# Patient Record
Sex: Female | Born: 1980 | Race: White | Hispanic: No | Marital: Married | Smoking: Current every day smoker
Health system: Southern US, Community
[De-identification: ages and names within clinical notes are randomized; demographics above are authoritative.]

## PROBLEM LIST (undated history)

## (undated) DIAGNOSIS — E039 Hypothyroidism, unspecified: Secondary | ICD-10-CM

## (undated) DIAGNOSIS — G2581 Restless legs syndrome: Secondary | ICD-10-CM

## (undated) DIAGNOSIS — R42 Dizziness and giddiness: Secondary | ICD-10-CM

## (undated) DIAGNOSIS — I471 Supraventricular tachycardia, unspecified: Secondary | ICD-10-CM

## (undated) DIAGNOSIS — N926 Irregular menstruation, unspecified: Secondary | ICD-10-CM

## (undated) DIAGNOSIS — K219 Gastro-esophageal reflux disease without esophagitis: Secondary | ICD-10-CM

## (undated) DIAGNOSIS — D8989 Other specified disorders involving the immune mechanism, not elsewhere classified: Secondary | ICD-10-CM

## (undated) DIAGNOSIS — N930 Postcoital and contact bleeding: Secondary | ICD-10-CM

## (undated) DIAGNOSIS — I4891 Unspecified atrial fibrillation: Secondary | ICD-10-CM

## (undated) DIAGNOSIS — D6861 Antiphospholipid syndrome: Secondary | ICD-10-CM

## (undated) DIAGNOSIS — I4719 Other supraventricular tachycardia: Secondary | ICD-10-CM

## (undated) DIAGNOSIS — E782 Mixed hyperlipidemia: Secondary | ICD-10-CM

## (undated) DIAGNOSIS — G43909 Migraine, unspecified, not intractable, without status migrainosus: Secondary | ICD-10-CM

## (undated) DIAGNOSIS — Z973 Presence of spectacles and contact lenses: Secondary | ICD-10-CM

## (undated) DIAGNOSIS — D259 Leiomyoma of uterus, unspecified: Secondary | ICD-10-CM

## (undated) DIAGNOSIS — N840 Polyp of corpus uteri: Secondary | ICD-10-CM

## (undated) DIAGNOSIS — R000 Tachycardia, unspecified: Secondary | ICD-10-CM

## (undated) HISTORY — DX: Morbid (severe) obesity due to excess calories: E66.01

## (undated) HISTORY — DX: Other supraventricular tachycardia: I47.19

## (undated) HISTORY — PX: TONSILLECTOMY: SUR1361

## (undated) HISTORY — DX: Hypothyroidism, unspecified: E03.9

## (undated) HISTORY — DX: Gastro-esophageal reflux disease without esophagitis: K21.9

## (undated) HISTORY — DX: Supraventricular tachycardia: I47.1

## (undated) HISTORY — DX: Mixed hyperlipidemia: E78.2

---

## 2012-08-23 ENCOUNTER — Emergency Department (HOSPITAL_COMMUNITY): Payer: No Typology Code available for payment source

## 2012-08-23 ENCOUNTER — Emergency Department (HOSPITAL_COMMUNITY)
Admission: EM | Admit: 2012-08-23 | Discharge: 2012-08-23 | Disposition: A | Discharge status: Home / Self Care | Payer: No Typology Code available for payment source | Attending: Emergency Medicine | Admitting: Emergency Medicine

## 2012-08-23 ENCOUNTER — Encounter (HOSPITAL_COMMUNITY): Payer: Self-pay | Admitting: *Deleted

## 2012-08-23 DIAGNOSIS — Y9389 Activity, other specified: Secondary | ICD-10-CM | POA: Insufficient documentation

## 2012-08-23 DIAGNOSIS — R51 Headache: Secondary | ICD-10-CM | POA: Insufficient documentation

## 2012-08-23 DIAGNOSIS — M545 Low back pain, unspecified: Secondary | ICD-10-CM | POA: Insufficient documentation

## 2012-08-23 DIAGNOSIS — E039 Hypothyroidism, unspecified: Secondary | ICD-10-CM | POA: Insufficient documentation

## 2012-08-23 DIAGNOSIS — M359 Systemic involvement of connective tissue, unspecified: Secondary | ICD-10-CM | POA: Insufficient documentation

## 2012-08-23 DIAGNOSIS — F17200 Nicotine dependence, unspecified, uncomplicated: Secondary | ICD-10-CM | POA: Insufficient documentation

## 2012-08-23 DIAGNOSIS — S161XXA Strain of muscle, fascia and tendon at neck level, initial encounter: Secondary | ICD-10-CM

## 2012-08-23 DIAGNOSIS — R110 Nausea: Secondary | ICD-10-CM | POA: Insufficient documentation

## 2012-08-23 DIAGNOSIS — R000 Tachycardia, unspecified: Secondary | ICD-10-CM | POA: Insufficient documentation

## 2012-08-23 DIAGNOSIS — R109 Unspecified abdominal pain: Secondary | ICD-10-CM | POA: Insufficient documentation

## 2012-08-23 DIAGNOSIS — S139XXA Sprain of joints and ligaments of unspecified parts of neck, initial encounter: Secondary | ICD-10-CM | POA: Insufficient documentation

## 2012-08-23 DIAGNOSIS — R911 Solitary pulmonary nodule: Secondary | ICD-10-CM | POA: Insufficient documentation

## 2012-08-23 DIAGNOSIS — D6859 Other primary thrombophilia: Secondary | ICD-10-CM | POA: Insufficient documentation

## 2012-08-23 HISTORY — DX: Antiphospholipid syndrome: D68.61

## 2012-08-23 HISTORY — DX: Hypothyroidism, unspecified: E03.9

## 2012-08-23 HISTORY — DX: Other specified disorders involving the immune mechanism, not elsewhere classified: D89.89

## 2012-08-23 HISTORY — DX: Tachycardia, unspecified: R00.0

## 2012-08-23 MED ORDER — OXYCODONE-ACETAMINOPHEN 5-325 MG PO TABS
2.0000 | ORAL_TABLET | Freq: Once | ORAL | Status: AC
  Administered 2012-08-23: 2 via ORAL
  Filled 2012-08-23: qty 2

## 2012-08-23 MED ORDER — OXYCODONE-ACETAMINOPHEN 5-325 MG PO TABS: 2.0000 | ORAL_TABLET | ORAL | Status: DC | PRN

## 2012-08-23 NOTE — ED Notes (Signed)
Patient transported to X-ray 
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
## 2013-04-26 ENCOUNTER — Encounter (HOSPITAL_BASED_OUTPATIENT_CLINIC_OR_DEPARTMENT_OTHER): Payer: Self-pay | Admitting: *Deleted

## 2013-04-26 ENCOUNTER — Emergency Department (HOSPITAL_BASED_OUTPATIENT_CLINIC_OR_DEPARTMENT_OTHER)
Admission: EM | Admit: 2013-04-26 | Discharge: 2013-04-26 | Disposition: A | Discharge status: Home / Self Care | Payer: No Typology Code available for payment source | Attending: Emergency Medicine | Admitting: Emergency Medicine

## 2013-04-26 ENCOUNTER — Emergency Department (HOSPITAL_BASED_OUTPATIENT_CLINIC_OR_DEPARTMENT_OTHER): Payer: No Typology Code available for payment source

## 2013-04-26 DIAGNOSIS — M79662 Pain in left lower leg: Secondary | ICD-10-CM

## 2013-04-26 DIAGNOSIS — Z8639 Personal history of other endocrine, nutritional and metabolic disease: Secondary | ICD-10-CM | POA: Insufficient documentation

## 2013-04-26 DIAGNOSIS — Z3202 Encounter for pregnancy test, result negative: Secondary | ICD-10-CM | POA: Insufficient documentation

## 2013-04-26 DIAGNOSIS — Z79899 Other long term (current) drug therapy: Secondary | ICD-10-CM | POA: Insufficient documentation

## 2013-04-26 DIAGNOSIS — M79609 Pain in unspecified limb: Secondary | ICD-10-CM | POA: Insufficient documentation

## 2013-04-26 DIAGNOSIS — R609 Edema, unspecified: Secondary | ICD-10-CM | POA: Insufficient documentation

## 2013-04-26 DIAGNOSIS — F17200 Nicotine dependence, unspecified, uncomplicated: Secondary | ICD-10-CM | POA: Insufficient documentation

## 2013-04-26 DIAGNOSIS — Z862 Personal history of diseases of the blood and blood-forming organs and certain disorders involving the immune mechanism: Secondary | ICD-10-CM | POA: Insufficient documentation

## 2013-04-26 DIAGNOSIS — E039 Hypothyroidism, unspecified: Secondary | ICD-10-CM | POA: Insufficient documentation

## 2013-04-26 LAB — URINALYSIS, ROUTINE W REFLEX MICROSCOPIC
Bilirubin Urine: NEGATIVE
Glucose, UA: NEGATIVE mg/dL
Ketones, ur: NEGATIVE mg/dL
Protein, ur: NEGATIVE mg/dL
Urobilinogen, UA: 0.2 mg/dL (ref 0.0–1.0)

## 2013-04-26 LAB — PREGNANCY, URINE: Preg Test, Ur: NEGATIVE

## 2013-04-26 LAB — URINE MICROSCOPIC-ADD ON

## 2013-04-26 MED ORDER — ACETAMINOPHEN 500 MG PO TABS
1000.0000 mg | ORAL_TABLET | Freq: Once | ORAL | Status: AC
  Administered 2013-04-26: 1000 mg via ORAL
  Filled 2013-04-26: qty 2

## 2013-04-26 MED ORDER — HYDROCHLOROTHIAZIDE 25 MG PO TABS: 25.0000 mg | ORAL_TABLET | Freq: Every day | ORAL | Status: DC

## 2013-04-26 MED ORDER — HYDROCODONE-ACETAMINOPHEN 5-325 MG PO TABS: 2.0000 | ORAL_TABLET | ORAL | Status: DC | PRN

## 2013-04-26 NOTE — ED Provider Notes (Signed)
CSN: [id number]     Arrival date & time 04/26/13  1851 History     First MD Initiated Contact with Patient 04/26/13 1900     Chief Complaint  Patient presents with  . Leg Pain   (Consider location/radiation/quality/duration/timing/severity/associated sxs/prior Treatment) HPI Comments: Patient presents with complaints of bilateral leg swelling and pain in the left calf and left thigh. She denies any injury or trauma. She reports that she recently took a vacation to [place] and flew home within the past several days. She tells me her flight was approximately 13 hours long. She denies any history of blood clots. She denies any chest pain or shortness of breath. She does report that she has had dependent edema in the past and has been on hydrochlorothiazide but has not been taking this in many months.  Patient is a 32 y.o. female presenting with leg pain. The history is provided by the patient.  Leg Pain Lower extremity pain location: thigh and calf. Time since incident:  3 days Injury: no   Pain details:    Quality:  Aching   Radiates to:  Does not radiate   Severity:  Moderate   Onset quality:  Gradual   Duration:  3 days   Timing:  Constant   Progression:  Worsening Chronicity:  New   Past Medical History  Diagnosis Date  . Hypothyroid   . Tachycardia   . Antiphospholipid antibody syndrome   . Autoimmune disorder    Past Surgical History  Procedure Laterality Date  . Tonsillectomy     No family history on file. History  Substance Use Topics  . Smoking status: Current Every Day Smoker -- 1.00 packs/day    Types: Cigarettes  . Smokeless tobacco: Not on file  . Alcohol Use: No   OB History   Grav Para Term Preterm Abortions TAB SAB Ect Mult Living                 Review of Systems  All other systems reviewed and are negative.    Allergies  Nsaids  Home Medications   Current Outpatient Rx  Name  Route  Sig  Dispense  Refill  . Vitamin D, Ergocalciferol,  (DRISDOL) 50000 UNITS CAPS   Oral   Take 50,000 Units by mouth.         . clonazePAM (KLONOPIN) 0.5 MG tablet   Oral   Take 0.25 mg by mouth as needed. Restless leg         . levothyroxine (SYNTHROID, LEVOTHROID) 125 MCG tablet   Oral   Take 125 mcg by mouth daily.         . metoprolol succinate (TOPROL XL) 50 MG 24 hr tablet   Oral   Take 50 mg by mouth daily. Take with or immediately following a meal.         . oxyCODONE-acetaminophen (PERCOCET/ROXICET) 5-325 MG per tablet   Oral   Take 2 tablets by mouth every 4 (four) hours as needed for pain.   6 tablet   0    BP 130/93  Pulse 73  Temp(Src) 98.3 F (36.8 C) (Oral)  Resp 20  Wt 225 lb (102.059 kg)  BMI 39.87 kg/m2  SpO2 100% Physical Exam  Nursing note and vitals reviewed. Constitutional: She is oriented to person, place, and time. She appears well-developed and well-nourished. No distress.  HENT:  Head: Normocephalic and atraumatic.  Neck: Normal range of motion. Neck supple.  Cardiovascular: Normal rate and regular rhythm.  Exam reveals no gallop and no friction rub.   No murmur heard. Pulmonary/Chest: Effort normal and breath sounds normal. No respiratory distress. She has no wheezes.  Abdominal: Soft. Bowel sounds are normal. She exhibits no distension. There is no tenderness.  Musculoskeletal: Normal range of motion. She exhibits edema.  The bilateral lower extremities are noted to have non-pitting edema. The dorsalis pedis and posterior tibial pulses are palpable and equal bilaterally. The left calf is tender to palpation and Homans sign is present. There is also tenderness to palpation in the left thigh. There is no redness.  Neurological: She is alert and oriented to person, place, and time.  Skin: Skin is warm and dry. She is not diaphoretic.    ED Course   Procedures (including critical care time)  Labs Reviewed  URINALYSIS, ROUTINE W REFLEX MICROSCOPIC - Abnormal; Notable for the following:     Hgb urine dipstick MODERATE (*)    All other components within normal limits  PREGNANCY, URINE  URINE MICROSCOPIC-ADD ON   US Venous Img Lower Unilateral Left  04/26/2013   *RADIOLOGY REPORT*  Clinical Data: Left calf and thigh pain and history of antiphospholipid antibody syndrome.  Recent prolonged air flight.  LEFT LOWER EXTREMITY VENOUS DUPLEX ULTRASOUND  Technique:  Gray-scale sonography with graded compression, as well as color Doppler and duplex ultrasound were performed to evaluate the deep venous system of the lower extremity from the level of the common femoral vein through the popliteal and proximal calf veins. Spectral Doppler was utilized to evaluate flow at rest and with distal augmentation maneuvers.  Comparison:  None.  Findings:  Normal compressibility of the common femoral, superficial femoral, and popliteal veins is demonstrated, as well as the visualized proximal calf veins.  No filling defects to suggest DVT on grayscale or color Doppler imaging.  Doppler waveforms show normal direction of venous flow, normal respiratory phasicity and response to augmentation.  No evidence of superficial thrombophlebitis or abnormal fluid collections.  IMPRESSION: No evidence of left lower extremity deep vein thrombosis.   Original Report Authenticated By: Irish Lack, M.D.   No diagnosis found.  MDM  The workup today reveals no evidence for blood clot. I suspect her symptoms are related to edema possibly related to the plane ride home. I will prescribe hydrochlorothiazide and advised her to keep her feet elevated for the next several days. She is also instructed to return to the emergency department should she experience further problems or if her condition worsens.  Geoffery Lyons, MD 04/26/13 2041

## 2013-04-26 NOTE — ED Notes (Signed)
Pt. Reports on Sunday she returned home from [place].  Pt. Was on a long flight and started to feel pain while walking thru the airport on Sat.

## 2013-04-26 NOTE — ED Notes (Signed)
Left leg pain. Pain has moved from her calf to her thigh. Just got back from a 13 hour air flight.

## 2013-06-01 ENCOUNTER — Encounter (HOSPITAL_BASED_OUTPATIENT_CLINIC_OR_DEPARTMENT_OTHER): Payer: Self-pay | Admitting: *Deleted

## 2013-06-01 ENCOUNTER — Emergency Department (HOSPITAL_BASED_OUTPATIENT_CLINIC_OR_DEPARTMENT_OTHER)
Admission: EM | Admit: 2013-06-01 | Discharge: 2013-06-01 | Disposition: A | Discharge status: Home / Self Care | Payer: No Typology Code available for payment source | Attending: Emergency Medicine | Admitting: Emergency Medicine

## 2013-06-01 ENCOUNTER — Emergency Department (HOSPITAL_BASED_OUTPATIENT_CLINIC_OR_DEPARTMENT_OTHER): Payer: No Typology Code available for payment source

## 2013-06-01 DIAGNOSIS — W010XXA Fall on same level from slipping, tripping and stumbling without subsequent striking against object, initial encounter: Secondary | ICD-10-CM | POA: Insufficient documentation

## 2013-06-01 DIAGNOSIS — Z8639 Personal history of other endocrine, nutritional and metabolic disease: Secondary | ICD-10-CM | POA: Insufficient documentation

## 2013-06-01 DIAGNOSIS — S90512A Abrasion, left ankle, initial encounter: Secondary | ICD-10-CM

## 2013-06-01 DIAGNOSIS — E039 Hypothyroidism, unspecified: Secondary | ICD-10-CM | POA: Insufficient documentation

## 2013-06-01 DIAGNOSIS — W19XXXA Unspecified fall, initial encounter: Secondary | ICD-10-CM

## 2013-06-01 DIAGNOSIS — F17200 Nicotine dependence, unspecified, uncomplicated: Secondary | ICD-10-CM | POA: Insufficient documentation

## 2013-06-01 DIAGNOSIS — Z862 Personal history of diseases of the blood and blood-forming organs and certain disorders involving the immune mechanism: Secondary | ICD-10-CM | POA: Insufficient documentation

## 2013-06-01 DIAGNOSIS — Z79899 Other long term (current) drug therapy: Secondary | ICD-10-CM | POA: Insufficient documentation

## 2013-06-01 DIAGNOSIS — Y9301 Activity, walking, marching and hiking: Secondary | ICD-10-CM | POA: Insufficient documentation

## 2013-06-01 DIAGNOSIS — IMO0002 Reserved for concepts with insufficient information to code with codable children: Secondary | ICD-10-CM | POA: Insufficient documentation

## 2013-06-01 DIAGNOSIS — Y92009 Unspecified place in unspecified non-institutional (private) residence as the place of occurrence of the external cause: Secondary | ICD-10-CM | POA: Insufficient documentation

## 2013-06-01 DIAGNOSIS — Z23 Encounter for immunization: Secondary | ICD-10-CM | POA: Insufficient documentation

## 2013-06-01 MED ORDER — HYDROCODONE-ACETAMINOPHEN 5-325 MG PO TABS: 2.0000 | ORAL_TABLET | Freq: Four times a day (QID) | ORAL | Status: DC | PRN

## 2013-06-01 MED ORDER — TETANUS-DIPHTH-ACELL PERTUSSIS 5-2.5-18.5 LF-MCG/0.5 IM SUSP
0.5000 mL | Freq: Once | INTRAMUSCULAR | Status: AC
  Administered 2013-06-01: 0.5 mL via INTRAMUSCULAR
  Filled 2013-06-01: qty 0.5

## 2013-06-01 NOTE — ED Notes (Signed)
Pt reports slipped and fell on vinyl floor this am. Has abrasion to left shin and pain to first three toes on left foot. Able to move ankle, toes limited, swelling noted to big toe.

## 2013-06-01 NOTE — ED Provider Notes (Signed)
CSN: [id number]     Arrival date & time 06/01/13  1033 History   First MD Initiated Contact with Patient 06/01/13 1132     Chief Complaint  Patient presents with  . Leg Pain  . Toe Pain   (Consider location/radiation/quality/duration/timing/severity/associated sxs/prior Treatment) HPI Patient presents several hours after sustaining an injury to her left lower extremity.  She states that she was walking on stairs, slipped, fell.  Since that time she has been ambulatory to  She has focal pain about the left shin, left ankle, left foot. No dysesthesia, no weakness.  Pain is sharp, worse with palpation or motion. The patient has minor hip discomfort, but no true pain. No loss of consciousness him in no subsequent confusion, disorientation, other focal new complaints.  Past Medical History  Diagnosis Date  . Hypothyroid   . Tachycardia   . Antiphospholipid antibody syndrome   . Autoimmune disorder    Past Surgical History  Procedure Laterality Date  . Tonsillectomy     No family history on file. History  Substance Use Topics  . Smoking status: Current Every Day Smoker -- 1.00 packs/day    Types: Cigarettes  . Smokeless tobacco: Not on file  . Alcohol Use: No   OB History   Grav Para Term Preterm Abortions TAB SAB Ect Mult Living                 Review of Systems  All other systems reviewed and are negative.    Allergies  Nsaids  Home Medications   Current Outpatient Rx  Name  Route  Sig  Dispense  Refill  . clonazePAM (KLONOPIN) 0.5 MG tablet   Oral   Take 0.25 mg by mouth as needed. Restless leg         . hydrochlorothiazide (HYDRODIURIL) 25 MG tablet   Oral   Take 1 tablet (25 mg total) by mouth daily.   20 tablet   1   . HYDROcodone-acetaminophen (NORCO/VICODIN) 5-325 MG per tablet   Oral   Take 2 tablets by mouth every 6 (six) hours as needed for pain.   15 tablet   0   . levothyroxine (SYNTHROID, LEVOTHROID) 125 MCG tablet   Oral   Take 125 mcg by  mouth daily.         . metoprolol succinate (TOPROL XL) 50 MG 24 hr tablet   Oral   Take 50 mg by mouth daily. Take with or immediately following a meal.         . Vitamin D, Ergocalciferol, (DRISDOL) 50000 UNITS CAPS   Oral   Take 50,000 Units by mouth.          BP 125/85  Pulse 71  Temp(Src) 98 F (36.7 C) (Oral)  Resp 16  SpO2 99%  LMP 05/18/2013 Physical Exam  Nursing note and vitals reviewed. Constitutional: She is oriented to person, place, and time. She appears well-developed and well-nourished. No distress.  HENT:  Head: Normocephalic and atraumatic.  Eyes: Conjunctivae and EOM are normal.  Cardiovascular: Normal rate and regular rhythm.   Pulmonary/Chest: Effort normal and breath sounds normal. No stridor. No respiratory distress.  Abdominal: She exhibits no distension.  Musculoskeletal: She exhibits no edema.       Right hip: Normal.       Left hip: Normal.       Left knee: Normal.       Legs: Neurological: She is alert and oriented to person, place, and time. No cranial  nerve deficit.  Skin: Skin is warm and dry.  Psychiatric: She has a normal mood and affect.    ED Course  ORTHOPEDIC INJURY TREATMENT Date/Time: 06/01/2013 12:45 PM Performed by: Gerhard Munch Authorized by: Gerhard Munch Consent: Verbal consent obtained. The procedure was performed in an emergent situation. Risks and benefits: risks, benefits and alternatives were discussed Consent given by: patient Patient understanding: patient states understanding of the procedure being performed Patient consent: the patient's understanding of the procedure matches consent given Procedure consent: procedure consent matches procedure scheduled Relevant documents: relevant documents present and verified Test results: test results available and properly labeled Site marked: the operative site was marked Imaging studies: imaging studies available Required items: required blood products,  implants, devices, and special equipment available Patient identity confirmed: verbally with patient Time out: Immediately prior to procedure a "time out" was called to verify the correct patient, procedure, equipment, support staff and site/side marked as required. Injury location: ankle Location details: left ankle Injury type: soft tissue Pre-procedure neurovascular assessment: neurovascularly intact Pre-procedure distal perfusion: normal Pre-procedure neurological function: normal Pre-procedure range of motion: reduced Local anesthesia used: no Patient sedated: no Immobilization: splint Splint type: cam walker. Post-procedure neurovascular assessment: post-procedure neurovascularly intact Post-procedure distal perfusion: normal Post-procedure neurological function: normal Post-procedure range of motion: unchanged Patient tolerance: Patient tolerated the procedure well with no immediate complications.   (including critical care time) Labs Review Labs Reviewed - No data to display Imaging Review Dg Tibia/fibula Left  06/01/2013   *RADIOLOGY REPORT*  Clinical Data: Pain post trauma  LEFT TIBIA AND FIBULA - 2 VIEW  Comparison: None.  Findings: Frontal and lateral views were obtained.  No fracture or dislocation.  No abnormal periosteal reaction.  Joint spaces appear intact.  IMPRESSION: No abnormality noted.   Original Report Authenticated By: Bretta Bang, M.D.   Dg Foot Complete Left  06/01/2013   *RADIOLOGY REPORT*  Clinical Data: Pain post trauma  LEFT FOOT - COMPLETE 3+ VIEW  Comparison: None.  Findings:  Frontal, oblique, and lateral views were obtained. There is no fracture or dislocation.  Joint spaces appear intact.  No erosive change.  IMPRESSION: No abnormality noted.   Original Report Authenticated By: Bretta Bang, M.D.    MDM   1. Fall at home, initial encounter   2. Abrasion of ankle without infection, left, initial encounter    Patient presents following a  fall.  Notably, on exam the patient is in no distress.  However, the patient is diminished dorsiflexion of the foot, raising concern for soft tissue injury.  Patient multiple lacerations and abrasions, and her tetanus status was updated.  Wound care was provided, and the patient was discharged in a Cam Walker given concern for soft tissue injury and are occult fracture.    Gerhard Munch, MD 06/01/13 1246

## 2014-06-12 ENCOUNTER — Emergency Department (HOSPITAL_BASED_OUTPATIENT_CLINIC_OR_DEPARTMENT_OTHER): Payer: PRIVATE HEALTH INSURANCE

## 2014-06-12 ENCOUNTER — Encounter (HOSPITAL_BASED_OUTPATIENT_CLINIC_OR_DEPARTMENT_OTHER): Payer: Self-pay | Admitting: Emergency Medicine

## 2014-06-12 ENCOUNTER — Emergency Department (HOSPITAL_BASED_OUTPATIENT_CLINIC_OR_DEPARTMENT_OTHER)
Admission: EM | Admit: 2014-06-12 | Discharge: 2014-06-12 | Disposition: A | Discharge status: Home / Self Care | Payer: PRIVATE HEALTH INSURANCE | Attending: Emergency Medicine | Admitting: Emergency Medicine

## 2014-06-12 DIAGNOSIS — Z792 Long term (current) use of antibiotics: Secondary | ICD-10-CM | POA: Insufficient documentation

## 2014-06-12 DIAGNOSIS — Z79899 Other long term (current) drug therapy: Secondary | ICD-10-CM | POA: Insufficient documentation

## 2014-06-12 DIAGNOSIS — R000 Tachycardia, unspecified: Secondary | ICD-10-CM | POA: Insufficient documentation

## 2014-06-12 DIAGNOSIS — E039 Hypothyroidism, unspecified: Secondary | ICD-10-CM | POA: Insufficient documentation

## 2014-06-12 DIAGNOSIS — Z862 Personal history of diseases of the blood and blood-forming organs and certain disorders involving the immune mechanism: Secondary | ICD-10-CM | POA: Diagnosis not present

## 2014-06-12 DIAGNOSIS — Z3202 Encounter for pregnancy test, result negative: Secondary | ICD-10-CM | POA: Diagnosis not present

## 2014-06-12 DIAGNOSIS — F17200 Nicotine dependence, unspecified, uncomplicated: Secondary | ICD-10-CM | POA: Diagnosis not present

## 2014-06-12 DIAGNOSIS — R109 Unspecified abdominal pain: Secondary | ICD-10-CM | POA: Diagnosis present

## 2014-06-12 DIAGNOSIS — R1084 Generalized abdominal pain: Secondary | ICD-10-CM | POA: Insufficient documentation

## 2014-06-12 LAB — URINALYSIS, ROUTINE W REFLEX MICROSCOPIC
BILIRUBIN URINE: NEGATIVE
GLUCOSE, UA: NEGATIVE mg/dL
Ketones, ur: NEGATIVE mg/dL
Leukocytes, UA: NEGATIVE
Nitrite: NEGATIVE
PROTEIN: NEGATIVE mg/dL
SPECIFIC GRAVITY, URINE: 1.009 (ref 1.005–1.030)
UROBILINOGEN UA: 0.2 mg/dL (ref 0.0–1.0)
pH: 5.5 (ref 5.0–8.0)

## 2014-06-12 LAB — COMPREHENSIVE METABOLIC PANEL
ALBUMIN: 4 g/dL (ref 3.5–5.2)
ALT: 22 U/L (ref 0–35)
AST: 19 U/L (ref 0–37)
Alkaline Phosphatase: 62 U/L (ref 39–117)
Anion gap: 14 (ref 5–15)
BILIRUBIN TOTAL: 0.3 mg/dL (ref 0.3–1.2)
BUN: 12 mg/dL (ref 6–23)
CALCIUM: 9.8 mg/dL (ref 8.4–10.5)
CHLORIDE: 100 meq/L (ref 96–112)
CO2: 24 mEq/L (ref 19–32)
CREATININE: 0.9 mg/dL (ref 0.50–1.10)
GFR calc Af Amer: >90 mL/min (ref >90)
GFR calc non Af Amer: 82 mL/min — ABNORMAL LOW (ref >90)
Glucose, Bld: 95 mg/dL (ref 70–99)
Potassium: 4.1 mEq/L (ref 3.7–5.3)
Sodium: 138 mEq/L (ref 137–147)
Total Protein: 8.2 g/dL (ref 6.0–8.3)

## 2014-06-12 LAB — URINE MICROSCOPIC-ADD ON

## 2014-06-12 LAB — CBC WITH DIFFERENTIAL/PLATELET
BASOS ABS: 0 10*3/uL (ref 0.0–0.1)
BASOS PCT: 0 % (ref 0–1)
Eosinophils Absolute: 0.2 10*3/uL (ref 0.0–0.7)
Eosinophils Relative: 1 % (ref 0–5)
HCT: 44.2 % (ref 36.0–46.0)
Hemoglobin: 14.6 g/dL (ref 12.0–15.0)
LYMPHS PCT: 25 % (ref 12–46)
Lymphs Abs: 2.8 10*3/uL (ref 0.7–4.0)
MCH: 28.2 pg (ref 26.0–34.0)
MCHC: 33 g/dL (ref 30.0–36.0)
MCV: 85.5 fL (ref 78.0–100.0)
MONO ABS: 0.7 10*3/uL (ref 0.1–1.0)
Monocytes Relative: 6 % (ref 3–12)
NEUTROS ABS: 7.9 10*3/uL — AB (ref 1.7–7.7)
Neutrophils Relative %: 68 % (ref 43–77)
PLATELETS: 282 10*3/uL (ref 150–400)
RBC: 5.17 MIL/uL — ABNORMAL HIGH (ref 3.87–5.11)
RDW: 14.2 % (ref 11.5–15.5)
WBC: 11.6 10*3/uL — AB (ref 4.0–10.5)

## 2014-06-12 LAB — LIPASE, BLOOD: LIPASE: 25 U/L (ref 11–59)

## 2014-06-12 LAB — PREGNANCY, URINE: Preg Test, Ur: NEGATIVE

## 2014-06-12 LAB — OCCULT BLOOD X 1 CARD TO LAB, STOOL: FECAL OCCULT BLD: NEGATIVE

## 2014-06-12 MED ORDER — SODIUM CHLORIDE 0.9 % IV BOLUS (SEPSIS)
1000.0000 mL | Freq: Once | INTRAVENOUS | Status: AC
  Administered 2014-06-12: 1000 mL via INTRAVENOUS

## 2014-06-12 MED ORDER — MORPHINE SULFATE 4 MG/ML IJ SOLN
4.0000 mg | Freq: Once | INTRAMUSCULAR | Status: AC
  Administered 2014-06-12: 4 mg via INTRAVENOUS
  Filled 2014-06-12: qty 1

## 2014-06-12 MED ORDER — IOHEXOL 300 MG/ML  SOLN
50.0000 mL | Freq: Once | INTRAMUSCULAR | Status: AC | PRN
  Administered 2014-06-12: 50 mL via ORAL

## 2014-06-12 MED ORDER — ONDANSETRON HCL 4 MG PO TABS: 4.0000 mg | ORAL_TABLET | Freq: Four times a day (QID) | ORAL | Status: DC

## 2014-06-12 MED ORDER — IOHEXOL 300 MG/ML  SOLN
100.0000 mL | Freq: Once | INTRAMUSCULAR | Status: AC | PRN
  Administered 2014-06-12: 100 mL via INTRAVENOUS

## 2014-06-12 MED ORDER — HYDROCODONE-ACETAMINOPHEN 5-325 MG PO TABS: 2.0000 | ORAL_TABLET | ORAL | Status: DC | PRN

## 2014-06-12 MED ORDER — ONDANSETRON HCL 4 MG/2ML IJ SOLN
4.0000 mg | Freq: Once | INTRAMUSCULAR | Status: AC
  Administered 2014-06-12: 4 mg via INTRAVENOUS
  Filled 2014-06-12: qty 2

## 2014-06-12 NOTE — ED Provider Notes (Signed)
CSN: [id number]     Arrival date & time 06/12/14  1955 History  This chart was scribed for Cassidy Essex, MD by Einar Pheasant, ED Scribe. This patient was seen in room MH11/MH11 and the patient's care was started at 8:21 PM.    Chief Complaint  Patient presents with  . Abdominal Pain   The history is provided by the patient. No language interpreter was used.   HPI Comments: Cassidy [name] is a 33 y.o. female who presents to the Emergency Department complaining of worsening abdominal pain with first onset 3 days ago. Pt states that she was seen on Monday by her PCP who is suspicious that she may have diverticulitis because she had blood in her stool. She states that the medicine that she was prescribed by her PCP is not working (500 mg metronidazole and 40 mg omeprazole). She states that the pain is mainly located around her belly button and lower quadrant. Pt states that the blood in her stool started on Sunday, but it has now resolved. She reports associated intermittent low grade fevers and lose bowels (which she describes as bright yellow). Ms. Muchow does report a hx of hemorrhoids at 62. Pt states that a rectal exam was done by her PCP which was negative for polyps or hemorrhoids. Ms. Wissink is scheduled for an abdominal CT scan on Thursday. She denies any chest pain, SOB, or congestion.   Pt is allergic to NSAIDs.    Past Medical History  Diagnosis Date  . Hypothyroid   . Tachycardia   . Antiphospholipid antibody syndrome   . Autoimmune disorder    Past Surgical History  Procedure Laterality Date  . Tonsillectomy     History reviewed. No pertinent family history. History  Substance Use Topics  . Smoking status: Current Every Day Smoker -- 1.00 packs/day    Types: Cigarettes  . Smokeless tobacco: Not on file  . Alcohol Use: No   OB History   Grav Para Term Preterm Abortions TAB SAB Ect Mult Living                 Review of Systems A complete 10 system review of systems was  obtained and all systems are negative except as noted in the HPI and PMH.   Allergies  Nsaids  Home Medications   Prior to Admission medications   Medication Sig Start Date End Date Taking? Authorizing Provider  clonazePAM (KLONOPIN) 0.5 MG tablet Take 0.25 mg by mouth as needed. Restless leg   Yes Historical Provider, MD  levothyroxine (SYNTHROID, LEVOTHROID) 125 MCG tablet Take 125 mcg by mouth daily.   Yes Historical Provider, MD  metoprolol succinate (TOPROL XL) 50 MG 24 hr tablet Take 50 mg by mouth daily. Take with or immediately following a meal.   Yes Historical Provider, MD  metroNIDAZOLE (FLAGYL) 500 MG tablet Take 500 mg by mouth 3 (three) times daily.   Yes Historical Provider, MD  Vitamin D, Ergocalciferol, (DRISDOL) 50000 UNITS CAPS Take 50,000 Units by mouth.   Yes Historical Provider, MD  hydrochlorothiazide (HYDRODIURIL) 25 MG tablet Take 1 tablet (25 mg total) by mouth daily. 04/26/13   Veryl Speak, MD  HYDROcodone-acetaminophen (NORCO/VICODIN) 5-325 MG per tablet Take 2 tablets by mouth every 6 (six) hours as needed for pain. 06/01/13   Carmin Muskrat, MD  HYDROcodone-acetaminophen (NORCO/VICODIN) 5-325 MG per tablet Take 2 tablets by mouth every 4 (four) hours as needed. 06/12/14   Cassidy Essex, MD  ondansetron (ZOFRAN) 4 MG tablet  Take 1 tablet (4 mg total) by mouth every 6 (six) hours. 06/12/14   Cassidy Essex, MD   Triage Vitals:BP 123/93  Pulse 94  Temp(Src) 99.2 F (37.3 C) (Oral)  Resp 18  Ht 5\' 3"  (1.6 m)  Wt 245 lb (111.131 kg)  BMI 43.41 kg/m2  SpO2 100%  LMP 05/26/2014  Physical Exam  Nursing note and vitals reviewed. Constitutional: She is oriented to person, place, and time. She appears well-developed and well-nourished. No distress.  HENT:  Head: Normocephalic and atraumatic.  Mouth/Throat: Oropharynx is clear and moist. No oropharyngeal exudate.  Eyes: Conjunctivae and EOM are normal. Pupils are equal, round, and reactive to light.  Neck:  Normal range of motion. Neck supple.  No meningismus.  Cardiovascular: Normal rate, regular rhythm, normal heart sounds and intact distal pulses.   No murmur heard. Pulmonary/Chest: Effort normal and breath sounds normal. No respiratory distress.  Abdominal: Soft. There is no tenderness. There is no rebound and no guarding.  Tenderness to Lower abdomen with guarding. No CVA tenderness.  R>L  Genitourinary:  Rectal exam: no fissures, hemorrhoids, or gross blood. Chaperone present.  Musculoskeletal: Normal range of motion. She exhibits no edema and no tenderness.  Neurological: She is alert and oriented to person, place, and time. No cranial nerve deficit. She exhibits normal muscle tone. Coordination normal.  No ataxia on finger to nose bilaterally. No pronator drift. 5/5 strength throughout. CN 2-12 intact. Negative Romberg. Equal grip strength. Sensation intact. Gait is normal.   Skin: Skin is warm.  Psychiatric: She has a normal mood and affect. Her behavior is normal.    ED Course  Procedures (including critical care time)  DIAGNOSTIC STUDIES: Oxygen Saturation is 100% on RA, normal by my interpretation.    COORDINATION OF CARE: 8:32 PM- Pt advised of plan for treatment and pt agrees.  Medications  sodium chloride 0.9 % bolus 1,000 mL (0 mLs Intravenous Stopped 06/12/14 2304)  ondansetron (ZOFRAN) injection 4 mg (4 mg Intravenous Given 06/12/14 2103)  morphine 4 MG/ML injection 4 mg (4 mg Intravenous Given 06/12/14 2102)  iohexol (OMNIPAQUE) 300 MG/ML solution 50 mL (50 mLs Oral Contrast Given 06/12/14 2121)  iohexol (OMNIPAQUE) 300 MG/ML solution 100 mL (100 mLs Intravenous Contrast Given 06/12/14 2142)   Labs Review Labs Reviewed  URINALYSIS, ROUTINE W REFLEX MICROSCOPIC - Abnormal; Notable for the following:    Hgb urine dipstick MODERATE (*)    All other components within normal limits  CBC WITH DIFFERENTIAL - Abnormal; Notable for the following:    WBC 11.6 (*)    RBC 5.17  (*)    Neutro Abs 7.9 (*)    All other components within normal limits  COMPREHENSIVE METABOLIC PANEL - Abnormal; Notable for the following:    GFR calc non Af Amer 82 (*)    All other components within normal limits  PREGNANCY, URINE  LIPASE, BLOOD  OCCULT BLOOD X 1 CARD TO LAB, STOOL  URINE MICROSCOPIC-ADD ON    Imaging Review Ct Abdomen Pelvis W Contrast  06/12/2014   CLINICAL DATA:  Low abdominal pain. Nausea. Blood in stool. Diarrhea.  EXAM: CT ABDOMEN AND PELVIS WITH CONTRAST  TECHNIQUE: Multidetector CT imaging of the abdomen and pelvis was performed using the standard protocol following bolus administration of intravenous contrast.  CONTRAST:  69mL OMNIPAQUE IOHEXOL 300 MG/ML SOLN, 125mL OMNIPAQUE IOHEXOL 300 MG/ML SOLN  COMPARISON:  None.  FINDINGS: The liver, biliary tree, spleen, pancreas, adrenal glands, and kidneys are normal. The bowel  is normal including the terminal ileum and appendix. Uterus and left ovary are normal. 17 mm partially collapsed cyst on the right ovary. No free air or free fluid in the abdomen. No osseous abnormality.  IMPRESSION: Benign appearing abdomen. Partially collapsed cyst on the right ovary.   Electronically Signed   By: Rozetta Nunnery M.D.   On: 06/12/2014 21:52     EKG Interpretation None      MDM   Final diagnoses:  Generalized abdominal pain   3 day history of lower abdominal pain with intermittent bloody diarrhea. Has been taking Flagyl by PCP. No vomiting or fever.  UA with hematuria. White blood cell count 11.6. CT shows normal appendix. No evidence of diverticulitis. Partially collapsed right ovarian cyst.  Question possible passed kidney stone.  Abdomen soft. Patient tolerating PO. Advised to continue flagyl per PCP.  Follow up this week. Return precautions discussed.  I personally performed the services described in this documentation, which was scribed in my presence. The recorded information has been reviewed and is  accurate.    Cassidy Essex, MD 06/13/14 [phone]

## 2014-06-12 NOTE — Discharge Instructions (Signed)
Abdominal Pain Continue your medications as prescribed. Follow up with your doctor. Return to the ED if you develop new or worsening symptoms. Many things can cause abdominal pain. Usually, abdominal pain is not caused by a disease and will improve without treatment. It can often be observed and treated at home. Your health care provider will do a physical exam and possibly order blood tests and X-rays to help determine the seriousness of your pain. However, in many cases, more time must pass before a clear cause of the pain can be found. Before that point, your health care provider may not know if you need more testing or further treatment. HOME CARE INSTRUCTIONS  Monitor your abdominal pain for any changes. The following actions may help to alleviate any discomfort you are experiencing:  Only take over-the-counter or prescription medicines as directed by your health care provider.  Do not take laxatives unless directed to do so by your health care provider.  Try a clear liquid diet (broth, tea, or water) as directed by your health care provider. Slowly move to a bland diet as tolerated. SEEK MEDICAL CARE IF:  You have unexplained abdominal pain.  You have abdominal pain associated with nausea or diarrhea.  You have pain when you urinate or have a bowel movement.  You experience abdominal pain that wakes you in the night.  You have abdominal pain that is worsened or improved by eating food.  You have abdominal pain that is worsened with eating fatty foods.  You have a fever. SEEK IMMEDIATE MEDICAL CARE IF:   Your pain does not go away within 2 hours.  You keep throwing up (vomiting).  Your pain is felt only in portions of the abdomen, such as the right side or the left lower portion of the abdomen.  You pass bloody or black tarry stools. MAKE SURE YOU:  Understand these instructions.   Will watch your condition.   Will get help right away if you are not doing well or get  worse.  Document Released: 06/23/2005 Document Revised: 09/18/2013 Document Reviewed: 05/23/2013 [facility] Patient Information 2015 [place], [place]. This information is not intended to replace advice given to you by your health care provider. Make sure you discuss any questions you have with your health care provider.

## 2014-06-12 NOTE — ED Notes (Signed)
Pt was seen at pcp Monday for abdominal pain, pcp thinks she has diverticultis due to blood in stool on Monday, she has ct scanned scheduled outpatient for Thursday, presents today due to increased abdominal pain, fatigue and water retention

## 2014-06-12 NOTE — ED Notes (Signed)
MD at bedside. 

## 2014-06-12 NOTE — ED Notes (Signed)
C/o abd pain onset Saturday, w nausea and diarrhea, w some blood in stool

## 2014-06-12 NOTE — ED Notes (Signed)
Pt was started on flagyl by pcp on monday

## 2015-03-28 ENCOUNTER — Emergency Department (HOSPITAL_BASED_OUTPATIENT_CLINIC_OR_DEPARTMENT_OTHER)
Admission: EM | Admit: 2015-03-28 | Discharge: 2015-03-28 | Disposition: A | Discharge status: Home / Self Care | Payer: Managed Care, Other (non HMO) | Attending: Emergency Medicine | Admitting: Emergency Medicine

## 2015-03-28 ENCOUNTER — Encounter (HOSPITAL_BASED_OUTPATIENT_CLINIC_OR_DEPARTMENT_OTHER): Payer: Self-pay | Admitting: *Deleted

## 2015-03-28 ENCOUNTER — Emergency Department (HOSPITAL_BASED_OUTPATIENT_CLINIC_OR_DEPARTMENT_OTHER): Payer: Managed Care, Other (non HMO)

## 2015-03-28 DIAGNOSIS — I4891 Unspecified atrial fibrillation: Secondary | ICD-10-CM | POA: Insufficient documentation

## 2015-03-28 DIAGNOSIS — Z8739 Personal history of other diseases of the musculoskeletal system and connective tissue: Secondary | ICD-10-CM | POA: Diagnosis not present

## 2015-03-28 DIAGNOSIS — Z79899 Other long term (current) drug therapy: Secondary | ICD-10-CM | POA: Insufficient documentation

## 2015-03-28 DIAGNOSIS — R51 Headache: Secondary | ICD-10-CM | POA: Diagnosis present

## 2015-03-28 DIAGNOSIS — Z862 Personal history of diseases of the blood and blood-forming organs and certain disorders involving the immune mechanism: Secondary | ICD-10-CM | POA: Insufficient documentation

## 2015-03-28 DIAGNOSIS — E039 Hypothyroidism, unspecified: Secondary | ICD-10-CM | POA: Diagnosis not present

## 2015-03-28 DIAGNOSIS — R110 Nausea: Secondary | ICD-10-CM | POA: Insufficient documentation

## 2015-03-28 DIAGNOSIS — R519 Headache, unspecified: Secondary | ICD-10-CM

## 2015-03-28 DIAGNOSIS — R42 Dizziness and giddiness: Secondary | ICD-10-CM | POA: Insufficient documentation

## 2015-03-28 DIAGNOSIS — Z720 Tobacco use: Secondary | ICD-10-CM | POA: Insufficient documentation

## 2015-03-28 HISTORY — DX: Unspecified atrial fibrillation: I48.91

## 2015-03-28 MED ORDER — ONDANSETRON HCL 4 MG/2ML IJ SOLN
4.0000 mg | Freq: Once | INTRAMUSCULAR | Status: AC
  Administered 2015-03-28: 4 mg via INTRAVENOUS
  Filled 2015-03-28: qty 2

## 2015-03-28 MED ORDER — DIPHENHYDRAMINE HCL 50 MG/ML IJ SOLN
25.0000 mg | Freq: Once | INTRAMUSCULAR | Status: DC
  Filled 2015-03-28: qty 1

## 2015-03-28 MED ORDER — MECLIZINE HCL 50 MG PO TABS: 50.0000 mg | ORAL_TABLET | Freq: Three times a day (TID) | ORAL | Status: DC | PRN

## 2015-03-28 MED ORDER — MECLIZINE HCL 25 MG PO TABS
25.0000 mg | ORAL_TABLET | Freq: Once | ORAL | Status: AC
  Administered 2015-03-28: 25 mg via ORAL
  Filled 2015-03-28: qty 1

## 2015-03-28 MED ORDER — DEXAMETHASONE SODIUM PHOSPHATE 10 MG/ML IJ SOLN
10.0000 mg | Freq: Once | INTRAMUSCULAR | Status: AC
  Administered 2015-03-28: 10 mg via INTRAVENOUS
  Filled 2015-03-28: qty 1

## 2015-03-28 MED ORDER — SODIUM CHLORIDE 0.9 % IV BOLUS (SEPSIS)
1000.0000 mL | Freq: Once | INTRAVENOUS | Status: AC
  Administered 2015-03-28: 1000 mL via INTRAVENOUS

## 2015-03-28 MED ORDER — METOCLOPRAMIDE HCL 5 MG/ML IJ SOLN
10.0000 mg | Freq: Once | INTRAMUSCULAR | Status: DC
  Filled 2015-03-28: qty 2

## 2015-03-28 MED ORDER — ONDANSETRON 4 MG PO TBDP: 4.0000 mg | ORAL_TABLET | Freq: Three times a day (TID) | ORAL | Status: DC | PRN

## 2015-03-28 NOTE — ED Notes (Signed)
Patient transported to CT 

## 2015-03-28 NOTE — ED Notes (Signed)
C/o severe left temporal  h/a that started on Wed. Night and took vicodin for it. States when she lays down pain is in bil temple area. Feels dizzy with getting up. Nausea but no vomiting. C/o left ear pain and drained fluid this am.

## 2015-03-28 NOTE — ED Notes (Signed)
Pt is extremely unsteady when ambulating

## 2015-03-28 NOTE — Discharge Instructions (Signed)
General Headache Without Cause A headache is pain or discomfort felt around the head or neck area. The specific cause of a headache may not be found. There are many causes and types of headaches. A few common ones are:  Tension headaches.  Migraine headaches.  Cluster headaches.  Chronic daily headaches. HOME CARE INSTRUCTIONS   Keep all follow-up appointments with your caregiver or any specialist referral.  Only take over-the-counter or prescription medicines for pain or discomfort as directed by your caregiver.  Lie down in a dark, quiet room when you have a headache.  Keep a headache journal to find out what may trigger your migraine headaches. For example, write down:  What you eat and drink.  How much sleep you get.  Any change to your diet or medicines.  Try massage or other relaxation techniques.  Put ice packs or heat on the head and neck. Use these 3 to 4 times per day for 15 to 20 minutes each time, or as needed.  Limit stress.  Sit up straight, and do not tense your muscles.  Quit smoking if you smoke.  Limit alcohol use.  Decrease the amount of caffeine you drink, or stop drinking caffeine.  Eat and sleep on a regular schedule.  Get 7 to 9 hours of sleep, or as recommended by your caregiver.  Keep lights dim if bright lights bother you and make your headaches worse. SEEK MEDICAL CARE IF:   You have problems with the medicines you were prescribed.  Your medicines are not working.  You have a change from the usual headache.  You have nausea or vomiting. SEEK IMMEDIATE MEDICAL CARE IF:   Your headache becomes severe.  You have a fever.  You have a stiff neck.  You have loss of vision.  You have muscular weakness or loss of muscle control.  You start losing your balance or have trouble walking.  You feel faint or pass out.  You have severe symptoms that are different from your first symptoms. MAKE SURE YOU:   Understand these  instructions.  Will watch your condition.  Will get help right away if you are not doing well or get worse. Document Released: 09/13/2005 Document Revised: 12/06/2011 Document Reviewed: 09/29/2011 [facility] Patient Information 2015 [place], [place]. This information is not intended to replace advice given to you by your health care provider. Make sure you discuss any questions you have with your health care provider. Vertigo Vertigo means you feel like you or your surroundings are moving when they are not. Vertigo can be dangerous if it occurs when you are at work, driving, or performing difficult activities.  CAUSES  Vertigo occurs when there is a conflict of signals sent to your brain from the visual and sensory systems in your body. There are many different causes of vertigo, including:  Infections, especially in the inner ear.  A bad reaction to a drug or misuse of alcohol and medicines.  Withdrawal from drugs or alcohol.  Rapidly changing positions, such as lying down or rolling over in bed.  A migraine headache.  Decreased blood flow to the brain.  Increased pressure in the brain from a head injury, infection, tumor, or bleeding. SYMPTOMS  You may feel as though the world is spinning around or you are falling to the ground. Because your balance is upset, vertigo can cause nausea and vomiting. You may have involuntary eye movements (nystagmus). DIAGNOSIS  Vertigo is usually diagnosed by physical exam. If the cause of your  vertigo is unknown, your caregiver may perform imaging tests, such as an MRI scan (magnetic resonance imaging). TREATMENT  Most cases of vertigo resolve on their own, without treatment. Depending on the cause, your caregiver may prescribe certain medicines. If your vertigo is related to body position issues, your caregiver may recommend movements or procedures to correct the problem. In rare cases, if your vertigo is caused by certain inner ear problems, you may  need surgery. HOME CARE INSTRUCTIONS   Follow your caregiver's instructions.  Avoid driving.  Avoid operating heavy machinery.  Avoid performing any tasks that would be dangerous to you or others during a vertigo episode.  Tell your caregiver if you notice that certain medicines seem to be causing your vertigo. Some of the medicines used to treat vertigo episodes can actually make them worse in some people. SEEK IMMEDIATE MEDICAL CARE IF:   Your medicines do not relieve your vertigo or are making it worse.  You develop problems with talking, walking, weakness, or using your arms, hands, or legs.  You develop severe headaches.  Your nausea or vomiting continues or gets worse.  You develop visual changes.  A family member notices behavioral changes.  Your condition gets worse. MAKE SURE YOU:  Understand these instructions.  Will watch your condition.  Will get help right away if you are not doing well or get worse. Document Released: 06/23/2005 Document Revised: 12/06/2011 Document Reviewed: 04/01/2011 [facility] Patient Information 2015 [place], [place]. This information is not intended to replace advice given to you by your health care provider. Make sure you discuss any questions you have with your health care provider.

## 2015-03-28 NOTE — ED Provider Notes (Signed)
CSN: [id number]     Arrival date & time 03/28/15  1440 History   First MD Initiated Contact with Patient 03/28/15 1532     No chief complaint on file.    (Consider location/radiation/quality/duration/timing/severity/associated sxs/prior Treatment) HPI Comments: Patient presents to the emergency department with chief complaint of headache. States the headache started in her left temporal region on Wednesday. States that it is been progressively worsening. States that she tried to take a Vicodin for it, with no relief. She reports associated nausea, but no vomiting. Does report some associated vertigo.  He denies any associated fevers, chills, weakness, numbness, or tingling.  The history is provided by the patient. No language interpreter was used.    Past Medical History  Diagnosis Date  . Hypothyroid   . Tachycardia   . Antiphospholipid antibody syndrome   . Autoimmune disorder   . Atrial fibrillation    Past Surgical History  Procedure Laterality Date  . Tonsillectomy     No family history on file. History  Substance Use Topics  . Smoking status: Current Every Day Smoker -- 1.00 packs/day    Types: Cigarettes  . Smokeless tobacco: Not on file  . Alcohol Use: No   OB History    No data available     Review of Systems  Constitutional: Negative for fever and chills.  Respiratory: Negative for shortness of breath.   Cardiovascular: Negative for chest pain.  Gastrointestinal: Negative for nausea, vomiting, diarrhea and constipation.  Genitourinary: Negative for dysuria.  Neurological: Positive for dizziness and headaches.      Allergies  Ciprocinonide and Nsaids  Home Medications   Prior to Admission medications   Medication Sig Start Date End Date Taking? Authorizing Provider  HYDROcodone-acetaminophen (NORCO/VICODIN) 5-325 MG per tablet Take 2 tablets by mouth every 6 (six) hours as needed for pain. 06/01/13  Yes Carmin Muskrat, MD  levothyroxine (SYNTHROID,  LEVOTHROID) 125 MCG tablet Take 125 mcg by mouth daily.   Yes Historical Provider, MD  metoprolol succinate (TOPROL XL) 50 MG 24 hr tablet Take 50 mg by mouth daily. Take with or immediately following a meal.   Yes Historical Provider, MD   BP 126/69 mmHg  Pulse 98  Temp(Src) 98.3 F (36.8 C) (Oral)  Resp 18  Ht 5\' 3"  (1.6 m)  Wt 234 lb (106.142 kg)  BMI 41.46 kg/m2  SpO2 97%  LMP 03/17/2015 Physical Exam  Constitutional: She is oriented to person, place, and time. She appears well-developed and well-nourished.  HENT:  Head: Normocephalic and atraumatic.  Right Ear: External ear normal.  Left Ear: External ear normal.  Eyes: Conjunctivae and EOM are normal. Pupils are equal, round, and reactive to light.  Neck: Normal range of motion. Neck supple.  No pain with neck flexion, no meningismus  Cardiovascular: Normal rate, regular rhythm and normal heart sounds.  Exam reveals no gallop and no friction rub.   No murmur heard. Pulmonary/Chest: Effort normal and breath sounds normal. No respiratory distress. She has no wheezes. She has no rales. She exhibits no tenderness.  Abdominal: Soft. Bowel sounds are normal. She exhibits no distension and no mass. There is no tenderness. There is no rebound and no guarding.  Musculoskeletal: Normal range of motion. She exhibits no edema or tenderness.  Normal gait.  Neurological: She is alert and oriented to person, place, and time. She has normal reflexes.  CN 3-12 intact, normal finger to nose, no pronator drift, sensation and strength intact bilaterally, no visual field cuts  Skin: Skin is warm and dry.  Psychiatric: She has a normal mood and affect. Her behavior is normal. Judgment and thought content normal.  Nursing note and vitals reviewed.   ED Course  Procedures (including critical care time) Labs Review Labs Reviewed - No data to display  Imaging Review No results found.   EKG Interpretation None      MDM   Final diagnoses:   Headache, unspecified headache type  Vertigo    Patient declines Benadryl and Reglan, will give Decadron and Zofran.  Pt HA treated and improved while in ED.  Presentation is like pts typical HA and non concerning for [facility], ICH, Meningitis, or temporal arteritis. Pt is afebrile with no focal neuro deficits, nuchal rigidity, or change in vision. Pt is to follow up with PCP to discuss prophylactic medication. Pt verbalizes understanding and is agreeable with plan to dc.    I witnessed the patient ambulating, she was able to do so with some assistance, but did not have any evidence of ataxia.  Discussed with Dr. Ralene Bathe, who agrees with plan for discharge.  Will treat with meclizine.  Neurovascularly intact.  Highly doubt cerebellar stroke.  CT is negative.  DC to home.    Montine Circle, PA-C 03/28/15 1807  Quintella Reichert, MD 03/28/15 Kathyrn Drown

## 2015-03-28 NOTE — ED Notes (Signed)
Patient eating and drinking without any trouble or nausea at this time.

## 2015-11-25 ENCOUNTER — Emergency Department (HOSPITAL_BASED_OUTPATIENT_CLINIC_OR_DEPARTMENT_OTHER)
Admission: EM | Admit: 2015-11-25 | Discharge: 2015-11-25 | Disposition: A | Discharge status: Home / Self Care | Payer: No Typology Code available for payment source | Attending: Emergency Medicine | Admitting: Emergency Medicine

## 2015-11-25 ENCOUNTER — Encounter (HOSPITAL_BASED_OUTPATIENT_CLINIC_OR_DEPARTMENT_OTHER): Payer: Self-pay | Admitting: *Deleted

## 2015-11-25 DIAGNOSIS — Z86018 Personal history of other benign neoplasm: Secondary | ICD-10-CM | POA: Insufficient documentation

## 2015-11-25 DIAGNOSIS — R519 Headache, unspecified: Secondary | ICD-10-CM

## 2015-11-25 DIAGNOSIS — E039 Hypothyroidism, unspecified: Secondary | ICD-10-CM | POA: Insufficient documentation

## 2015-11-25 DIAGNOSIS — Z3202 Encounter for pregnancy test, result negative: Secondary | ICD-10-CM | POA: Insufficient documentation

## 2015-11-25 DIAGNOSIS — R51 Headache: Secondary | ICD-10-CM | POA: Insufficient documentation

## 2015-11-25 DIAGNOSIS — F17210 Nicotine dependence, cigarettes, uncomplicated: Secondary | ICD-10-CM | POA: Insufficient documentation

## 2015-11-25 DIAGNOSIS — I4891 Unspecified atrial fibrillation: Secondary | ICD-10-CM | POA: Insufficient documentation

## 2015-11-25 DIAGNOSIS — G2581 Restless legs syndrome: Secondary | ICD-10-CM | POA: Insufficient documentation

## 2015-11-25 DIAGNOSIS — Z79899 Other long term (current) drug therapy: Secondary | ICD-10-CM | POA: Insufficient documentation

## 2015-11-25 DIAGNOSIS — Z8739 Personal history of other diseases of the musculoskeletal system and connective tissue: Secondary | ICD-10-CM | POA: Insufficient documentation

## 2015-11-25 HISTORY — DX: Restless legs syndrome: G25.81

## 2015-11-25 LAB — PREGNANCY, URINE: Preg Test, Ur: NEGATIVE

## 2015-11-25 MED ORDER — MAGNESIUM SULFATE 2 GM/50ML IV SOLN
2.0000 g | Freq: Once | INTRAVENOUS | Status: AC
  Administered 2015-11-25: 2 g via INTRAVENOUS
  Filled 2015-11-25: qty 50

## 2015-11-25 MED ORDER — PROCHLORPERAZINE EDISYLATE 5 MG/ML IJ SOLN
10.0000 mg | Freq: Once | INTRAMUSCULAR | Status: AC
  Administered 2015-11-25: 10 mg via INTRAVENOUS
  Filled 2015-11-25: qty 2

## 2015-11-25 MED ORDER — GABAPENTIN 300 MG PO CAPS
ORAL_CAPSULE | ORAL | Status: AC
  Filled 2015-11-25: qty 1

## 2015-11-25 MED ORDER — SODIUM CHLORIDE 0.9 % IV BOLUS (SEPSIS)
500.0000 mL | Freq: Once | INTRAVENOUS | Status: AC
  Administered 2015-11-25: 500 mL via INTRAVENOUS

## 2015-11-25 MED ORDER — DIPHENHYDRAMINE HCL 50 MG/ML IJ SOLN
25.0000 mg | Freq: Once | INTRAMUSCULAR | Status: AC
  Administered 2015-11-25: 25 mg via INTRAVENOUS
  Filled 2015-11-25: qty 1

## 2015-11-25 MED ORDER — GABAPENTIN 600 MG PO TABS
300.0000 mg | ORAL_TABLET | Freq: Once | ORAL | Status: AC
Start: 2015-11-25 — End: 2015-11-25
  Administered 2015-11-25: 300 mg via ORAL

## 2015-11-25 NOTE — Discharge Instructions (Signed)

## 2015-11-25 NOTE — ED Notes (Signed)
Headache since last night. Pain on and off for a week.

## 2015-11-25 NOTE — ED Notes (Signed)
Pt placed on auto vitals Q30.  

## 2015-11-25 NOTE — ED Provider Notes (Signed)
CSN: [id number]     Arrival date & time 11/25/15  1511 History   First MD Initiated Contact with Patient 11/25/15 1551     Chief Complaint  Patient presents with  . Headache     (Consider location/radiation/quality/duration/timing/severity/associated sxs/prior Treatment) HPI Comments: 35 year old female with past medical history including antiphospholipid antibody syndrome, atrial fibrillation, restless leg syndrome who presents with headache. Patient states that yesterday evening she had a gradual onset of headache. She went to sleep thinking that it was related to fatigue and this morning her headache has become progressively worse. Currently it is a left frontal, stabbing headache that is severe and worse with movement. Mild photophobia. Occasional blurry vision when the stabbing pain is severe but denies any visual changes currently. She has vomited twice because of the pain. She took gabapentin and Tylenol earlier today with no relief. She has had headaches previously. No sudden onset of headache. She denies any fevers or abdominal pain. She has mild cold symptoms related to change in weather.  Patient is a 35 y.o. female presenting with headaches. The history is provided by the patient.  Headache   Past Medical History  Diagnosis Date  . Hypothyroid   . Tachycardia   . Antiphospholipid antibody syndrome ([place])   . Autoimmune disorder ([place])   . Atrial fibrillation ([place])   . Restless leg syndrome    Past Surgical History  Procedure Laterality Date  . Tonsillectomy     No family history on file. Social History  Substance Use Topics  . Smoking status: Current Every Day Smoker -- 1.00 packs/day    Types: Cigarettes  . Smokeless tobacco: None  . Alcohol Use: No   OB History    No data available     Review of Systems  Neurological: Positive for headaches.    10 Systems reviewed and are negative for acute change except as noted in the HPI.   Allergies  Ciprocinonide and  Nsaids  Home Medications   Prior to Admission medications   Medication Sig Start Date End Date Taking? Authorizing Provider  GABAPENTIN PO Take by mouth.   Yes Historical Provider, MD  HYDROcodone-acetaminophen (NORCO/VICODIN) 5-325 MG per tablet Take 2 tablets by mouth every 6 (six) hours as needed for pain. 06/01/13   Carmin Muskrat, MD  levothyroxine (SYNTHROID, LEVOTHROID) 125 MCG tablet Take 125 mcg by mouth daily.    Historical Provider, MD  meclizine (ANTIVERT) 50 MG tablet Take 1 tablet (50 mg total) by mouth 3 (three) times daily as needed. 03/28/15   Montine Circle, PA-C  metoprolol succinate (TOPROL XL) 50 MG 24 hr tablet Take 50 mg by mouth daily. Take with or immediately following a meal.    Historical Provider, MD  ondansetron (ZOFRAN ODT) 4 MG disintegrating tablet Take 1 tablet (4 mg total) by mouth every 8 (eight) hours as needed for nausea or vomiting. 03/28/15   Montine Circle, PA-C   BP 114/79 mmHg  Pulse 78  Temp(Src) 98.6 F (37 C) (Oral)  Resp 18  SpO2 99%  LMP 11/18/2015 Physical Exam  Constitutional: She is oriented to person, place, and time. She appears well-developed and well-nourished. No distress.  Awake, alert; uncomfortable  HENT:  Head: Normocephalic and atraumatic.  Eyes: Conjunctivae and EOM are normal. Pupils are equal, round, and reactive to light.  Neck: Neck supple.  Cardiovascular: Normal rate, regular rhythm and normal heart sounds.   No murmur heard. Pulmonary/Chest: Effort normal and breath sounds normal. No respiratory distress.  Abdominal:  Soft. Bowel sounds are normal. She exhibits no distension.  Musculoskeletal: She exhibits no edema.  5/5 strength and normal sensation x all 4 ext  Neurological: She is alert and oriented to person, place, and time. She has normal reflexes. No cranial nerve deficit. She exhibits normal muscle tone.  Fluent speech, normal finger-to-nose testing, negative pronator drift  Skin: Skin is warm and dry.   Psychiatric: She has a normal mood and affect. Judgment and thought content normal.  Nursing note and vitals reviewed.   ED Course  Procedures (including critical care time) Labs Review Labs Reviewed  PREGNANCY, URINE    Imaging Review No results found. I have personally reviewed and evaluated these lab results as part of my medical decision-making.   EKG Interpretation None     Medications  gabapentin (NEURONTIN) 300 MG capsule (not administered)  diphenhydrAMINE (BENADRYL) injection 25 mg (25 mg Intravenous Given 11/25/15 1645)  prochlorperazine (COMPAZINE) injection 10 mg (10 mg Intravenous Given 11/25/15 1647)  sodium chloride 0.9 % bolus 500 mL (500 mLs Intravenous New Bag/Given 11/25/15 1653)  magnesium sulfate IVPB 2 g 50 mL (2 g Intravenous New Bag/Given 11/25/15 1707)  gabapentin (NEURONTIN) tablet 300 mg (300 mg Oral Given 11/25/15 1706)    MDM   Final diagnoses:  Acute nonintractable headache, unspecified headache type   Patient with gradually worsening headache since last night not responsive to Tylenol or gabapentin at home. On exam she was uncomfortable but in no acute distress. Vital signs unremarkable. Normal neurologic exam. Gave the patient IV Benadryl and Compazine, later Mg and home gabapentin w/ IVF bolus.   On reexamination, the patient stated that her headache was improved.  The patient denies any neurologic symptoms such as visual changes, focal numbness/weakness, balance problems, confusion, or speech difficulty to suggest a life-threatening intracranial process such as intracranial hemorrhage or mass. The patient has no clotting risk factors thus venous sinus thrombosis is unlikely.  I feel that the patient is safe for discharge home without any head imaging at this time. I have reviewed return precautions including development of neurologic symptoms, confusion, lethargy, or difficulty speaking and patient has voiced understanding.    Sharlett Iles, MD 11/25/15 [phone]

## 2018-10-30 ENCOUNTER — Ambulatory Visit
Admission: EM | Admit: 2018-10-30 | Discharge: 2018-10-30 | Disposition: A | Discharge status: Home / Self Care | Payer: No Typology Code available for payment source | Attending: Family Medicine | Admitting: Family Medicine

## 2018-10-30 ENCOUNTER — Encounter: Payer: Self-pay | Admitting: Emergency Medicine

## 2018-10-30 DIAGNOSIS — J111 Influenza due to unidentified influenza virus with other respiratory manifestations: Secondary | ICD-10-CM

## 2018-10-30 DIAGNOSIS — R509 Fever, unspecified: Secondary | ICD-10-CM | POA: Diagnosis not present

## 2018-10-30 DIAGNOSIS — R05 Cough: Secondary | ICD-10-CM | POA: Diagnosis not present

## 2018-10-30 DIAGNOSIS — J029 Acute pharyngitis, unspecified: Secondary | ICD-10-CM | POA: Diagnosis not present

## 2018-10-30 DIAGNOSIS — R69 Illness, unspecified: Principal | ICD-10-CM

## 2018-10-30 LAB — POCT INFLUENZA A/B
Influenza A, POC: NEGATIVE
Influenza B, POC: NEGATIVE

## 2018-10-30 MED ORDER — CETIRIZINE HCL 10 MG PO CAPS: 10.0000 mg | ORAL_CAPSULE | Freq: Every day | ORAL | 0 refills | Status: DC

## 2018-10-30 MED ORDER — BENZONATATE 200 MG PO CAPS: 200.0000 mg | ORAL_CAPSULE | Freq: Three times a day (TID) | ORAL | 0 refills | Status: AC | PRN

## 2018-10-30 MED ORDER — ACETAMINOPHEN-CODEINE #3 300-30 MG PO TABS: 1.0000 | ORAL_TABLET | Freq: Four times a day (QID) | ORAL | 0 refills | Status: DC | PRN

## 2018-10-30 NOTE — ED Notes (Signed)
Patient able to ambulate independently  

## 2018-10-30 NOTE — ED Provider Notes (Signed)
[facility]    CSN: [id number] Arrival date & time: 10/30/18  1256     History   Chief Complaint Chief Complaint  Patient presents with  . URI    HPI Cassidy [name] is a 38 y.o. female history of antiphospholipid antibody syndrome, A. fib, previous tonsillectomy presenting today for evaluation of URI symptoms.  Patient states that on Friday she began to have nasal congestion and cough.  Since she has developed increased fatigue, body aches and hot and cold chills.  She notes that she mainly is having body aches in her neck and shoulders.  She noted a temperature of 99.2 yesterday.  She has had some mild nausea and has developed some laryngitis.  She is concerned on if she has the flu or not.  Patient states that she went to [facility] recently and people she was with has also become ill.  She is unsure of any known flu contacts.  HPI  Past Medical History:  Diagnosis Date  . Antiphospholipid antibody syndrome ([place])   . Atrial fibrillation ([place])   . Autoimmune disorder ([place])   . Hypothyroid   . Restless leg syndrome   . Tachycardia     There are no active problems to display for this patient.   Past Surgical History:  Procedure Laterality Date  . TONSILLECTOMY      OB History   No obstetric history on file.      Home Medications    Prior to Admission medications   Medication Sig Start Date End Date Taking? Authorizing Provider  gabapentin (NEURONTIN) 300 MG capsule Take 300 mg by mouth at bedtime.   Yes [provider]  GABAPENTIN PO Take by mouth.   Yes [provider]  levothyroxine (SYNTHROID, LEVOTHROID) 125 MCG tablet Take 175 mcg by mouth daily.    Yes [provider]  metoprolol succinate (TOPROL XL) 50 MG 24 hr tablet Take 50 mg by mouth daily. Take with or immediately following a meal.   Yes [provider]  acetaminophen-codeine (TYLENOL #3) 300-30 MG tablet Take 1 tablet by mouth every 6 (six) hours as  needed for severe pain. 10/30/18   Kellyanne Ellwanger C, PA-C  benzonatate (TESSALON) 200 MG capsule Take 1 capsule (200 mg total) by mouth 3 (three) times daily as needed for up to 7 days for cough. 10/30/18 11/06/18  Tyan Dy C, PA-C  Cetirizine HCl 10 MG CAPS Take 1 capsule (10 mg total) by mouth daily for 10 days. 10/30/18 11/09/18  Maelee Hoot C, PA-C  meclizine (ANTIVERT) 50 MG tablet Take 1 tablet (50 mg total) by mouth 3 (three) times daily as needed. 03/28/15   Montine Circle, PA-C    Family History History reviewed. No pertinent family history.  Social History Social History   Tobacco Use  . Smoking status: Current Some Day Smoker    Packs/day: 1.00    Types: E-cigarettes  . Smokeless tobacco: Never Used  . Tobacco comment: vape only  Substance Use Topics  . Alcohol use: No  . Drug use: No     Allergies   Benadryl [diphenhydramine]; Ciprocinonide [fluocinolone]; and Nsaids   Review of Systems Review of Systems  Constitutional: Positive for appetite change, chills and fatigue. Negative for activity change and fever.  HENT: Positive for congestion, rhinorrhea and sore throat. Negative for ear pain, sinus pressure and trouble swallowing.   Eyes: Negative for discharge and redness.  Respiratory: Positive for cough. Negative for chest tightness and shortness  of breath.   Cardiovascular: Negative for chest pain.  Gastrointestinal: Positive for nausea. Negative for abdominal pain, diarrhea and vomiting.  Musculoskeletal: Positive for myalgias and neck pain.  Skin: Negative for rash.  Neurological: Positive for headaches. Negative for dizziness and light-headedness.     Physical Exam Triage Vital Signs ED Triage Vitals  Enc Vitals Group     BP 10/30/18 1306 117/84     Pulse Rate 10/30/18 1306 82     Resp 10/30/18 1306 18     Temp 10/30/18 1306 98.2 F (36.8 C)     Temp Source 10/30/18 1306 Oral     SpO2 10/30/18 1306 97 %     Weight --      Height --      Head  Circumference --      Peak Flow --      Pain Score 10/30/18 1307 6     Pain Loc --      Pain Edu? --      Excl. in [place]? --    No data found.  Updated Vital Signs BP 117/84 (BP Location: Left Arm)   Pulse 82   Temp 98.2 F (36.8 C) (Oral)   Resp 18   SpO2 97%   Visual Acuity Right Eye Distance:   Left Eye Distance:   Bilateral Distance:    Right Eye Near:   Left Eye Near:    Bilateral Near:     Physical Exam Vitals signs and nursing note reviewed.  Constitutional:      General: She is not in acute distress.    Appearance: She is well-developed.  HENT:     Head: Normocephalic and atraumatic.     Ears:     Comments: Bilateral ears without tenderness to palpation of external auricle, tragus and mastoid, EAC's without erythema or swelling, TM's with good bony landmarks and cone of light. Non erythematous.    Nose:     Comments: Nasal mucosa pink, mildly swollen turbinates    Mouth/Throat:     Comments: Oral mucosa pink and moist, no tonsillar enlargement or exudate. Posterior pharynx patent and nonerythematous, no uvula deviation or swelling. Normal phonation. Eyes:     Conjunctiva/sclera: Conjunctivae normal.  Neck:     Musculoskeletal: Neck supple.  Cardiovascular:     Rate and Rhythm: Normal rate and regular rhythm.     Heart sounds: No murmur.  Pulmonary:     Effort: Pulmonary effort is normal. No respiratory distress.     Breath sounds: Normal breath sounds.     Comments: Breathing comfortably at rest, CTABL, no wheezing, rales or other adventitious sounds auscultated Abdominal:     Palpations: Abdomen is soft.     Tenderness: There is no abdominal tenderness.  Skin:    General: Skin is warm and dry.  Neurological:     Mental Status: She is alert.      UC Treatments / Results  Labs (all labs ordered are listed, but only abnormal results are displayed) Labs Reviewed  POCT INFLUENZA A/B    EKG None  Radiology No results  found.  Procedures Procedures (including critical care time)  Medications Ordered in UC Medications - No data to display  Initial Impression / Assessment and Plan / UC Course  I have reviewed the triage vital signs and the nursing notes.  Pertinent labs & imaging results that were available during my care of the patient were reviewed by me and considered in my medical decision making (see  chart for details).     URI symptoms for 3 to 4 days, vital signs stable in clinic today, exam nonfocal.  Flu test negative.  Most likely viral etiology.  Recommending symptomatic and supportive care.  Push fluids.  Rest.  Symptomatic recommendations below.  Patient has allergy to NSAIDs, did provide patient with Tylenol threes to use for her body aches, discussed drowsiness regarding this and advised not to use while working or driving.  Continue to monitor,Discussed strict return precautions. Patient verbalized understanding and is agreeable with plan.  Final Clinical Impressions(s) / UC Diagnoses   Final diagnoses:  Influenza-like illness     Discharge Instructions     You likely having a viral upper respiratory infection. We recommended symptom control. I expect your symptoms to start improving in the next 1-2 weeks.   1. Take a daily allergy pill/anti-histamine like Zyrtec, Claritin, or Store brand consistently for 2 weeks  2. For congestion you may try an oral decongestant like Mucinex or sudafed. You may also try intranasal flonase nasal spray or saline irrigations (neti pot, sinus cleanse)  3. For your sore throat you may try cepacol lozenges, salt water gargles, throat spray. Treatment of congestion may also help your sore throat.  4. For cough you may try Tessalon provided or OTC delsym, Robitussen, Mucinex DM  5. Take Tylenol [phone] mg every 4-6 hours to help with pain/inflammation  6. Stay hydrated, drink plenty of fluids to keep throat coated and less irritated  Honey Tea For  cough/sore throat try using a honey-based tea. Use 3 teaspoons of honey with juice squeezed from half lemon. Place shaved pieces of ginger into 1/2-1 cup of water and warm over stove top. Then mix the ingredients and repeat every 4 hours as needed.   ED Prescriptions    Medication Sig Dispense Auth. Provider   Cetirizine HCl 10 MG CAPS Take 1 capsule (10 mg total) by mouth daily for 10 days. 10 capsule Talah Cookston C, PA-C   benzonatate (TESSALON) 200 MG capsule Take 1 capsule (200 mg total) by mouth 3 (three) times daily as needed for up to 7 days for cough. 28 capsule Madex Seals C, PA-C   acetaminophen-codeine (TYLENOL #3) 300-30 MG tablet Take 1 tablet by mouth every 6 (six) hours as needed for severe pain. 12 tablet Egan Berkheimer, [place] C, PA-C     Controlled Substance Prescriptions [place] Controlled Substance Registry consulted? Yes, I have consulted the [place] Controlled Substances Registry for this patient, and feel the risk/benefit ratio today is favorable for proceeding with this prescription for a controlled substance.   Janith Lima, [place] 10/30/18 1436

## 2018-10-30 NOTE — Discharge Instructions (Signed)
You likely having a viral upper respiratory infection. We recommended symptom control. I expect your symptoms to start improving in the next 1-2 weeks.   1. Take a daily allergy pill/anti-histamine like Zyrtec, Claritin, or Store brand consistently for 2 weeks  2. For congestion you may try an oral decongestant like Mucinex or sudafed. You may also try intranasal flonase nasal spray or saline irrigations (neti pot, sinus cleanse)  3. For your sore throat you may try cepacol lozenges, salt water gargles, throat spray. Treatment of congestion may also help your sore throat.  4. For cough you may try Tessalon provided or OTC delsym, Robitussen, Mucinex DM  5. Take Tylenol [phone] mg every 4-6 hours to help with pain/inflammation  6. Stay hydrated, drink plenty of fluids to keep throat coated and less irritated  Honey Tea For cough/sore throat try using a honey-based tea. Use 3 teaspoons of honey with juice squeezed from half lemon. Place shaved pieces of ginger into 1/2-1 cup of water and warm over stove top. Then mix the ingredients and repeat every 4 hours as needed.

## 2018-10-30 NOTE — ED Triage Notes (Signed)
Pt presents to [facility] for assessment of "I just want to know if I have the flu".  States symptoms starting Friday, Saturday worsening with fatigue, body aches, congestion, neck pain, chills, laryngitis and intermittent nausea.

## 2018-11-29 ENCOUNTER — Ambulatory Visit
Admission: EM | Admit: 2018-11-29 | Discharge: 2018-11-29 | Disposition: A | Discharge status: Home / Self Care | Payer: No Typology Code available for payment source | Attending: Physician Assistant | Admitting: Physician Assistant

## 2018-11-29 DIAGNOSIS — J209 Acute bronchitis, unspecified: Secondary | ICD-10-CM | POA: Diagnosis not present

## 2018-11-29 MED ORDER — DEXAMETHASONE SODIUM PHOSPHATE 10 MG/ML IJ SOLN
10.0000 mg | Freq: Once | INTRAMUSCULAR | Status: AC
  Administered 2018-11-29: 10 mg via INTRAMUSCULAR

## 2018-11-29 MED ORDER — BENZONATATE 100 MG PO CAPS: 100.0000 mg | ORAL_CAPSULE | Freq: Three times a day (TID) | ORAL | 0 refills | Status: DC

## 2018-11-29 MED ORDER — IPRATROPIUM BROMIDE 0.06 % NA SOLN: 2.0000 | Freq: Four times a day (QID) | NASAL | 12 refills | Status: DC

## 2018-11-29 MED ORDER — ALBUTEROL SULFATE HFA 108 (90 BASE) MCG/ACT IN AERS: 1.0000 | INHALATION_SPRAY | Freq: Four times a day (QID) | RESPIRATORY_TRACT | 0 refills | Status: DC | PRN

## 2018-11-29 NOTE — ED Provider Notes (Signed)
[facility]    CSN: [id number] Arrival date & time: 11/29/18  1705     History   Chief Complaint Chief Complaint  Patient presents with  . Cough    HPI Cassidy [name] is a 38 y.o. female.   38 year old female comes in for 3-day history of URI symptoms.  Has had rhinorrhea, nasal congestion, cough.  States has noticed shortness of breath.  She describes it as hard to take a deep breath, which resolves after coughing up some mucus. She states has chest pain with coughing. Denies chest pain at rest. Denies palpitations. She denies fever, chills, night sweats.  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  Current everyday smoker.     Past Medical History:  Diagnosis Date  . Antiphospholipid antibody syndrome ([place])   . Atrial fibrillation ([place])   . Autoimmune disorder ([place])   . Hypothyroid   . Restless leg syndrome   . Tachycardia     There are no active problems to display for this patient.   Past Surgical History:  Procedure Laterality Date  . TONSILLECTOMY      OB History   No obstetric history on file.      Home Medications    Prior to Admission medications   Medication Sig Start Date End Date Taking? Authorizing Provider  acetaminophen-codeine (TYLENOL #3) 300-30 MG tablet Take 1 tablet by mouth every 6 (six) hours as needed for severe pain. 10/30/18   Wieters, Hallie C, PA-C  albuterol (PROVENTIL HFA;VENTOLIN HFA) 108 (90 Base) MCG/ACT inhaler Inhale 1-2 puffs into the lungs every 6 (six) hours as needed for wheezing or shortness of breath. 11/29/18   Tasia Catchings, Lynsey Ange V, PA-C  benzonatate (TESSALON) 100 MG capsule Take 1 capsule (100 mg total) by mouth every 8 (eight) hours. 11/29/18   Tasia Catchings, [place] Cloke V, PA-C  Cetirizine HCl 10 MG CAPS Take 1 capsule (10 mg total) by mouth daily for 10 days. 10/30/18 11/09/18  Wieters, Hallie C, PA-C  gabapentin (NEURONTIN) 300 MG capsule Take 300 mg by mouth at bedtime.    [provider]  GABAPENTIN PO  Take by mouth.    [provider]  ipratropium (ATROVENT) 0.06 % nasal spray Place 2 sprays into both nostrils 4 (four) times daily. 11/29/18   Tasia Catchings, Nashla Althoff V, PA-C  levothyroxine (SYNTHROID, LEVOTHROID) 125 MCG tablet Take 175 mcg by mouth daily.     [provider]  meclizine (ANTIVERT) 50 MG tablet Take 1 tablet (50 mg total) by mouth 3 (three) times daily as needed. 03/28/15   Montine Circle, PA-C  metoprolol succinate (TOPROL XL) 50 MG 24 hr tablet Take 50 mg by mouth daily. Take with or immediately following a meal.    [provider]    Family History No family history on file.  Social History Social History   Tobacco Use  . Smoking status: Current Some Day Smoker    Packs/day: 1.00    Types: E-cigarettes  . Smokeless tobacco: Never Used  . Tobacco comment: vape only  Substance Use Topics  . Alcohol use: No  . Drug use: No     Allergies   Benadryl [diphenhydramine]; Ciprocinonide [fluocinolone]; and Nsaids   Review of Systems Review of Systems  Reason unable to perform ROS: See HPI as above.     Physical Exam Triage Vital Signs ED Triage Vitals [11/29/18 1717]  Enc Vitals Group     BP (!) 135/97     Pulse Rate  82     Resp 20     Temp 98.3 F (36.8 C)     Temp Source Oral     SpO2 99 %     Weight      Height      Head Circumference      Peak Flow      Pain Score 0     Pain Loc      Pain Edu?      Excl. in [place]?    No data found.  Updated Vital Signs BP (!) 135/97 (BP Location: Left Arm)   Pulse 82   Temp 98.3 F (36.8 C) (Oral)   Resp 20   SpO2 99%   Physical Exam Constitutional:      General: She is not in acute distress.    Appearance: She is well-developed. She is not ill-appearing, toxic-appearing or diaphoretic.  HENT:     Head: Normocephalic and atraumatic.     Right Ear: Tympanic membrane, ear canal and external ear normal. Tympanic membrane is not erythematous or bulging.     Left Ear: Tympanic membrane, ear canal  and external ear normal. Tympanic membrane is not erythematous or bulging.     Nose: Nose normal.     Right Sinus: No maxillary sinus tenderness or frontal sinus tenderness.     Left Sinus: No maxillary sinus tenderness or frontal sinus tenderness.     Mouth/Throat:     Mouth: Mucous membranes are moist.     Pharynx: Oropharynx is clear. Uvula midline.  Eyes:     Conjunctiva/sclera: Conjunctivae normal.     Pupils: Pupils are equal, round, and reactive to light.  Neck:     Musculoskeletal: Normal range of motion and neck supple.  Cardiovascular:     Rate and Rhythm: Normal rate and regular rhythm.     Heart sounds: Normal heart sounds. No murmur. No friction rub. No gallop.   Pulmonary:     Effort: Pulmonary effort is normal. No accessory muscle usage, prolonged expiration, respiratory distress or retractions.     Breath sounds: Normal breath sounds. No stridor, decreased air movement or transmitted upper airway sounds. No decreased breath sounds, wheezing, rhonchi or rales.  Skin:    General: Skin is warm and dry.  Neurological:     Mental Status: She is alert and oriented to person, place, and time.      UC Treatments / Results  Labs (all labs ordered are listed, but only abnormal results are displayed) Labs Reviewed - No data to display  EKG None  Radiology No results found.  Procedures Procedures (including critical care time)  Medications Ordered in UC Medications  dexamethasone (DECADRON) injection 10 mg (10 mg Intramuscular Given 11/29/18 1737)    Initial Impression / Assessment and Plan / UC Course  I have reviewed the triage vital signs and the nursing notes.  Pertinent labs & imaging results that were available during my care of the patient were reviewed by me and considered in my medical decision making (see chart for details).    Patient states with significant side effects taking prednisone, would like to defer course of prednisone.  However, she has had  Decadron in the past with good relief and minimal side effects.  Will provide Decadron in office today for bronchitis.  Other symptomatic treatment discussed.  Push fluids.  Smoking cessation discussed.  Return precautions given.  Patient expresses understanding and agrees to plan.  Final Clinical Impressions(s) / UC Diagnoses  Final diagnoses:  Acute bronchitis, unspecified organism    ED Prescriptions    Medication Sig Dispense Auth. Provider   benzonatate (TESSALON) 100 MG capsule Take 1 capsule (100 mg total) by mouth every 8 (eight) hours. 21 capsule Hannibal Skalla V, PA-C   ipratropium (ATROVENT) 0.06 % nasal spray Place 2 sprays into both nostrils 4 (four) times daily. 15 mL Yatziry Deakins V, PA-C   albuterol (PROVENTIL HFA;VENTOLIN HFA) 108 (90 Base) MCG/ACT inhaler Inhale 1-2 puffs into the lungs every 6 (six) hours as needed for wheezing or shortness of breath. 1 Inhaler Tobin Chad, PA-C 11/29/18 1747

## 2018-11-29 NOTE — ED Triage Notes (Signed)
Pt c/o chest congestion with productive cough with greenish/brown sputum that taste like "infection" x3 days

## 2018-11-29 NOTE — Discharge Instructions (Addendum)
Decadron injection in office today. Tessalon for cough. Start atrovent nasal spray for nasal congestion/drainage. You can use over the counter nasal saline rinse such as neti pot for nasal congestion. Keep hydrated, your urine should be clear to pale yellow in color. Tylenol/motrin for fever and pain. Monitor for any worsening of symptoms, chest pain, shortness of breath, wheezing, swelling of the throat, follow up for reevaluation.

## 2018-12-02 ENCOUNTER — Ambulatory Visit
Admission: EM | Admit: 2018-12-02 | Discharge: 2018-12-02 | Disposition: A | Discharge status: Home / Self Care | Payer: Self-pay | Attending: Family Medicine | Admitting: Family Medicine

## 2018-12-02 ENCOUNTER — Other Ambulatory Visit: Payer: Self-pay

## 2018-12-02 ENCOUNTER — Ambulatory Visit (INDEPENDENT_AMBULATORY_CARE_PROVIDER_SITE_OTHER): Payer: Self-pay

## 2018-12-02 DIAGNOSIS — J22 Unspecified acute lower respiratory infection: Secondary | ICD-10-CM

## 2018-12-02 MED ORDER — DOXYCYCLINE HYCLATE 100 MG PO CAPS: 100.0000 mg | ORAL_CAPSULE | Freq: Two times a day (BID) | ORAL | 0 refills | Status: DC

## 2018-12-02 MED ORDER — DOXYCYCLINE HYCLATE 100 MG PO CAPS: 100.0000 mg | ORAL_CAPSULE | Freq: Two times a day (BID) | ORAL | 0 refills | Status: AC

## 2018-12-02 MED ORDER — FLUTICASONE PROPIONATE 50 MCG/ACT NA SUSP: 1.0000 | Freq: Every day | NASAL | 0 refills | Status: DC

## 2018-12-02 MED ORDER — BENZONATATE 200 MG PO CAPS: 200.0000 mg | ORAL_CAPSULE | Freq: Three times a day (TID) | ORAL | 0 refills | Status: DC | PRN

## 2018-12-02 MED ORDER — BENZONATATE 200 MG PO CAPS: 200.0000 mg | ORAL_CAPSULE | Freq: Three times a day (TID) | ORAL | 0 refills | Status: AC | PRN

## 2018-12-02 NOTE — ED Triage Notes (Signed)
Per pt she was diagnosed here 4 days ago with bronchitis and just has not been feeling any better. Has been having a non productive cough, with nasal congestion. Some body aches and chills but no fevers.

## 2018-12-02 NOTE — Discharge Instructions (Addendum)
No pneumonia on chest xray, I will call you if radiologist reads xray differently  Begin doxycycline twice daily for the next 10 days, this will treat for sinus infection as well as cover any atypical infection in the lung that may not show up on x-ray  Continue albuterol inhaler as needed For congestion may continue daily Claritin and supplement with Mucinex or Sudafed; or Flonase which I have prescribed For cough May continue using Tessalon which I have refilled or over-the-counter Delsym, Robitussin-DM, Mucinex DM  Please follow-up if symptoms continuing to not resolve with addition of antibiotic, developing fever, worsening symptoms

## 2018-12-02 NOTE — ED Provider Notes (Signed)
[facility]    CSN: [id number] Arrival date & time: 12/02/18  1036     History   Chief Complaint Chief Complaint  Patient presents with  . Cough    HPI Tandra Rosado is a 38 y.o. female history of antiphospholipid antibody syndrome, previous A. fib, presenting today for evaluation of a cough.  Patient states that she has had a cough and congestion for approximately 1 week.  Patient was seen here approximately 4 days ago and treated for bronchitis.  Given an injection of Decadron and using albuterol.  She has also continue to use guaifenesin.  Feels that inhaler has helped some.  She feels her symptoms have worsened and mucus has become thicker.  Denies any fevers.  Noting increased congestion in sinuses as well.  Does report sore throat, but feels this is related to the cough.  Denies history of asthma or smoking.  HPI  Past Medical History:  Diagnosis Date  . Antiphospholipid antibody syndrome ([place])   . Atrial fibrillation ([place])   . Autoimmune disorder ([place])   . Hypothyroid   . Restless leg syndrome   . Tachycardia     There are no active problems to display for this patient.   Past Surgical History:  Procedure Laterality Date  . TONSILLECTOMY      OB History   No obstetric history on file.      Home Medications    Prior to Admission medications   Medication Sig Start Date End Date Taking? Authorizing Provider  acetaminophen-codeine (TYLENOL #3) 300-30 MG tablet Take 1 tablet by mouth every 6 (six) hours as needed for severe pain. 10/30/18   Dorla Guizar C, PA-C  albuterol (PROVENTIL HFA;VENTOLIN HFA) 108 (90 Base) MCG/ACT inhaler Inhale 1-2 puffs into the lungs every 6 (six) hours as needed for wheezing or shortness of breath. 11/29/18   Tasia Catchings, Amy V, PA-C  benzonatate (TESSALON) 200 MG capsule Take 1 capsule (200 mg total) by mouth 3 (three) times daily as needed for up to 7 days for cough. 12/02/18 12/09/18  Saisha Hogue C, PA-C  Cetirizine HCl 10 MG CAPS  Take 1 capsule (10 mg total) by mouth daily for 10 days. 10/30/18 11/09/18  Jiayi Lengacher C, PA-C  doxycycline (VIBRAMYCIN) 100 MG capsule Take 1 capsule (100 mg total) by mouth 2 (two) times daily for 10 days. 12/02/18 12/12/18  Srishti Strnad C, PA-C  fluticasone (FLONASE) 50 MCG/ACT nasal spray Place 1-2 sprays into both nostrils daily. 12/02/18   Rashea Hoskie C, PA-C  gabapentin (NEURONTIN) 300 MG capsule Take 300 mg by mouth at bedtime.    [provider]  GABAPENTIN PO Take by mouth.    [provider]  ipratropium (ATROVENT) 0.06 % nasal spray Place 2 sprays into both nostrils 4 (four) times daily. 11/29/18   Tasia Catchings, Amy V, PA-C  levothyroxine (SYNTHROID, LEVOTHROID) 125 MCG tablet Take 175 mcg by mouth daily.     [provider]  metoprolol succinate (TOPROL XL) 50 MG 24 hr tablet Take 25 mg by mouth daily. Take with or immediately following a meal.     [provider]    Family History No family history on file.  Social History Social History   Tobacco Use  . Smoking status: Current Some Day Smoker    Packs/day: 1.00    Types: E-cigarettes  . Smokeless tobacco: Never Used  . Tobacco comment: vape only  Substance Use Topics  . Alcohol use: No  . Drug use:  No     Allergies   Benadryl [diphenhydramine]; Ciprocinonide [fluocinolone]; and Nsaids   Review of Systems Review of Systems  Constitutional: Negative for activity change, appetite change, chills, fatigue and fever.  HENT: Positive for congestion, rhinorrhea, sinus pressure and sore throat. Negative for ear pain and trouble swallowing.   Eyes: Negative for discharge and redness.  Respiratory: Positive for cough and chest tightness. Negative for shortness of breath.   Cardiovascular: Negative for chest pain.  Gastrointestinal: Negative for abdominal pain, diarrhea, nausea and vomiting.  Musculoskeletal: Negative for myalgias.  Skin: Negative for rash.  Neurological: Negative for  dizziness, light-headedness and headaches.     Physical Exam Triage Vital Signs ED Triage Vitals  Enc Vitals Group     BP 12/02/18 1116 130/85     Pulse Rate 12/02/18 1116 86     Resp 12/02/18 1116 18     Temp 12/02/18 1116 97.9 F (36.6 C)     Temp Source 12/02/18 1116 Oral     SpO2 12/02/18 1116 97 %     Weight 12/02/18 1120 220 lb (99.8 kg)     Height 12/02/18 1120 5\' 3"  (1.6 m)     Head Circumference --      Peak Flow --      Pain Score 12/02/18 1119 6     Pain Loc --      Pain Edu? --      Excl. in [place]? --    No data found.  Updated Vital Signs BP 130/85 (BP Location: Left Arm)   Pulse 86   Temp 97.9 F (36.6 C) (Oral)   Resp 18   Ht 5\' 3"  (1.6 m)   Wt 220 lb (99.8 kg)   LMP 11/20/2018   SpO2 97%   BMI 38.97 kg/m   Visual Acuity Right Eye Distance:   Left Eye Distance:   Bilateral Distance:    Right Eye Near:   Left Eye Near:    Bilateral Near:     Physical Exam Vitals signs and nursing note reviewed.  Constitutional:      General: She is not in acute distress.    Appearance: She is well-developed.  HENT:     Head: Normocephalic and atraumatic.     Ears:     Comments: Bilateral ears without tenderness to palpation of external auricle, tragus and mastoid, EAC's without erythema or swelling, TM's with good bony landmarks and cone of light. Non erythematous.     Nose:     Comments: Nasal mucosa slightly erythematous, thick white rhinorrhea present    Mouth/Throat:     Comments: Oral mucosa pink and moist, no tonsillar enlargement or exudate. Posterior pharynx patent and nonerythematous, no uvula deviation or swelling. Normal phonation. Eyes:     Conjunctiva/sclera: Conjunctivae normal.  Neck:     Musculoskeletal: Neck supple.  Cardiovascular:     Rate and Rhythm: Normal rate and regular rhythm.     Heart sounds: No murmur.  Pulmonary:     Effort: Pulmonary effort is normal. No respiratory distress.     Breath sounds: Normal breath sounds.      Comments: Breathing comfortably at rest, CTABL, no wheezing, rales or other adventitious sounds auscultated  Frequent coughing with deep inspiration, bronchospasm and wheezing auscultated with cough; otherwise no adventitious sounds with regular inspiration and expiration Abdominal:     Palpations: Abdomen is soft.     Tenderness: There is no abdominal tenderness.  Skin:    General: Skin is  warm and dry.  Neurological:     Mental Status: She is alert.      UC Treatments / Results  Labs (all labs ordered are listed, but only abnormal results are displayed) Labs Reviewed - No data to display  EKG None  Radiology Dg Chest 2 View  Result Date: 12/02/2018 CLINICAL DATA:  Cough and shortness of breath for 1 week EXAM: CHEST - 2 VIEW COMPARISON:  08/23/2012 FINDINGS: Cardiac shadows within normal limits. Lungs are well aerated bilaterally. Mild bronchitic changes are again seen and stable. No focal infiltrate is noted. No bony abnormality is seen. IMPRESSION: Stable bronchitic changes without acute abnormality. Electronically Signed   By: Inez Catalina M.D.   On: 12/02/2018 11:53    Procedures Procedures (including critical care time)  Medications Ordered in UC Medications - No data to display  Initial Impression / Assessment and Plan / UC Course  I have reviewed the triage vital signs and the nursing notes.  Pertinent labs & imaging results that were available during my care of the patient were reviewed by me and considered in my medical decision making (see chart for details).    Chest x-ray suggestive of bronchitis, no pneumonia.  Given length of symptoms will add in doxycycline to cover for atypicals in lungs as well as sinusitis.  Continue symptomatic and supportive care, continue albuterol.  Will defer any further steroids at this point given patient's concern and does not like how she feels when she is on steroids.  Continue to monitor,Discussed strict return precautions.  Patient verbalized understanding and is agreeable with plan.   Final Clinical Impressions(s) / UC Diagnoses   Final diagnoses:  Lower respiratory infection (e.g., bronchitis, pneumonia, pneumonitis, pulmonitis)     Discharge Instructions     No pneumonia on chest xray, I will call you if radiologist reads xray differently  Begin doxycycline twice daily for the next 10 days, this will treat for sinus infection as well as cover any atypical infection in the lung that may not show up on x-ray  Continue albuterol inhaler as needed For congestion may continue daily Claritin and supplement with Mucinex or Sudafed; or Flonase which I have prescribed For cough May continue using Tessalon which I have refilled or over-the-counter Delsym, Robitussin-DM, Mucinex DM  Please follow-up if symptoms continuing to not resolve with addition of antibiotic, developing fever, worsening symptoms   ED Prescriptions    Medication Sig Dispense Auth. Provider   doxycycline (VIBRAMYCIN) 100 MG capsule  (Status: Discontinued) Take 1 capsule (100 mg total) by mouth 2 (two) times daily for 10 days. 20 capsule Laksh Hinners C, PA-C   benzonatate (TESSALON) 200 MG capsule  (Status: Discontinued) Take 1 capsule (200 mg total) by mouth 3 (three) times daily as needed for up to 7 days for cough. 28 capsule Seleste Tallman C, PA-C   fluticasone (FLONASE) 50 MCG/ACT nasal spray  (Status: Discontinued) Place 1-2 sprays into both nostrils daily. 1 g James Senn C, PA-C   benzonatate (TESSALON) 200 MG capsule Take 1 capsule (200 mg total) by mouth 3 (three) times daily as needed for up to 7 days for cough. 28 capsule Mordechai Matuszak C, PA-C   doxycycline (VIBRAMYCIN) 100 MG capsule Take 1 capsule (100 mg total) by mouth 2 (two) times daily for 10 days. 20 capsule Caylan Chenard C, PA-C   fluticasone (FLONASE) 50 MCG/ACT nasal spray Place 1-2 sprays into both nostrils daily. 1 g Ryleigh Buenger, [place] C, [place]  Controlled  Substance Prescriptions [place] Controlled Substance Registry consulted? Not Applicable   Janith Lima, [place] 12/02/18 1416

## 2019-02-10 ENCOUNTER — Other Ambulatory Visit: Payer: Self-pay

## 2019-02-10 ENCOUNTER — Emergency Department (HOSPITAL_BASED_OUTPATIENT_CLINIC_OR_DEPARTMENT_OTHER): Payer: Self-pay

## 2019-02-10 ENCOUNTER — Encounter (HOSPITAL_BASED_OUTPATIENT_CLINIC_OR_DEPARTMENT_OTHER): Payer: Self-pay | Admitting: Emergency Medicine

## 2019-02-10 ENCOUNTER — Emergency Department (HOSPITAL_BASED_OUTPATIENT_CLINIC_OR_DEPARTMENT_OTHER)
Admission: EM | Admit: 2019-02-10 | Discharge: 2019-02-10 | Disposition: A | Discharge status: Home / Self Care | Payer: Self-pay | Attending: Emergency Medicine | Admitting: Emergency Medicine

## 2019-02-10 DIAGNOSIS — R079 Chest pain, unspecified: Secondary | ICD-10-CM | POA: Insufficient documentation

## 2019-02-10 DIAGNOSIS — R42 Dizziness and giddiness: Secondary | ICD-10-CM | POA: Insufficient documentation

## 2019-02-10 DIAGNOSIS — I4891 Unspecified atrial fibrillation: Secondary | ICD-10-CM | POA: Insufficient documentation

## 2019-02-10 DIAGNOSIS — R0789 Other chest pain: Secondary | ICD-10-CM

## 2019-02-10 DIAGNOSIS — F17210 Nicotine dependence, cigarettes, uncomplicated: Secondary | ICD-10-CM | POA: Insufficient documentation

## 2019-02-10 LAB — CBC
HCT: 40.3 % (ref 36.0–46.0)
Hemoglobin: 12.5 g/dL (ref 12.0–15.0)
MCH: 26.4 pg (ref 26.0–34.0)
MCHC: 31 g/dL (ref 30.0–36.0)
MCV: 85.2 fL (ref 80.0–100.0)
Platelets: 299 10*3/uL (ref 150–400)
RBC: 4.73 MIL/uL (ref 3.87–5.11)
RDW: 15 % (ref 11.5–15.5)
WBC: 8.7 10*3/uL (ref 4.0–10.5)
nRBC: 0 % (ref 0.0–0.2)

## 2019-02-10 LAB — BASIC METABOLIC PANEL
Anion gap: 9 (ref 5–15)
BUN: 14 mg/dL (ref 6–20)
CO2: 22 mmol/L (ref 22–32)
Calcium: 8.7 mg/dL — ABNORMAL LOW (ref 8.9–10.3)
Chloride: 106 mmol/L (ref 98–111)
Creatinine, Ser: 0.87 mg/dL (ref 0.44–1.00)
GFR calc Af Amer: >60 mL/min (ref >60)
GFR calc non Af Amer: >60 mL/min (ref >60)
Glucose, Bld: 92 mg/dL (ref 70–99)
Potassium: 3.8 mmol/L (ref 3.5–5.1)
Sodium: 137 mmol/L (ref 135–145)

## 2019-02-10 LAB — TROPONIN I: Troponin I: <0.03 ng/mL (ref <0.03)

## 2019-02-10 LAB — TSH: TSH: 0.165 u[IU]/mL — ABNORMAL LOW (ref 0.350–4.500)

## 2019-02-10 LAB — D-DIMER, QUANTITATIVE: D-Dimer, Quant: <0.27 ug/mL-FEU (ref 0.00–0.50)

## 2019-02-10 MED ORDER — GABAPENTIN 600 MG PO TABS
300.0000 mg | ORAL_TABLET | Freq: Once | ORAL | Status: DC
  Filled 2019-02-10: qty 0.5

## 2019-02-10 MED ORDER — SODIUM CHLORIDE 0.9 % IV BOLUS
1000.0000 mL | Freq: Once | INTRAVENOUS | Status: AC
  Administered 2019-02-10: 1000 mL via INTRAVENOUS

## 2019-02-10 MED ORDER — GABAPENTIN 300 MG PO CAPS
ORAL_CAPSULE | ORAL | Status: AC
  Administered 2019-02-10: 300 mg
  Filled 2019-02-10: qty 1

## 2019-02-10 NOTE — ED Provider Notes (Signed)
[facility] DEPART[facility] Provider Note   CSN: [id number] Arrival date & time: 02/10/19  1544    History   Chief Complaint Chief Complaint  Patient presents with   Chest Pain    HPI Cassidy [name] is a 38 y.o. female past medical history of antiphospholipid antibody syndrome, A. fib, hypothyroidism, tachycardia who presents for evaluation of intermittent episodes of lightheadedness, chest pain that is been ongoing for the last 2 days.  Patient reports that about 2 days ago, she noticed she was getting lightheaded episodes.  She describes a sensation as "almost feeling like I am going to pass out."  She denies any episodes of syncope or loss of consciousness.  Patient denies any room spinning sensation.  She has been able to walk without any difficulty.  She states that these episodes would occur randomly.  She states that she tried eating something but she felt "more jittery and anxious."  Patient states that yesterday, she developed intermittent chest pain.  Describes the chest pains as "shocks" and states that she would have 3-5 in a row.  Patient states that they would resolve by themselves.  She states that these are intermittent occur randomly.  She states they are not worse with exertion.  She has had them while both sitting down her car, at home and at work.  She states she got nauseous but does not have any diaphoresis.  Patient states that she denies any shortness of breath.  Patient also reports that today, she started developing a headache.  Headache is primarily on the left side of her head and radiates around her face.  She has not had any blurry vision, numbness/weakness of her arms or legs.  Patient states she has not had any fevers.  She denies any preceding trauma, injury.  She also reports that she started developing some pain in her upper right thigh.  She has not noticed any leg swelling, redness or warmth of the legs.  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  She does report that she will occasionally do 1 to 2-hour drive.  Patient states that she has not had any personal history of blood clots in her legs or lungs.  She does report she has history of antiphospholipid syndrome.  Patient denies any OCP use/hormone use, hospitalizations, surgeries.  Denies any fevers, neck pain, back pain, numbness/weakness of arms or legs, abdominal pain, nausea/vomiting.  She does not smoke but she does vape.  Denies any cocaine use.  She denies any personal cardiac history.  She states that her father had a heart attack at age 62 but otherwise denies any family cardiac history.    The history is provided by the patient.    Past Medical History:  Diagnosis Date   Antiphospholipid antibody syndrome ([facility])    Atrial fibrillation ([facility])    Autoimmune disorder ([facility])    Hypothyroid    Restless leg syndrome    Tachycardia     There are no active problems to display for this patient.   Past Surgical History:  Procedure Laterality Date   TONSILLECTOMY       OB History   No obstetric history on file.      Home Medications    Prior to Admission medications   Medication Sig Start Date End Date Taking? Authorizing Provider  acetaminophen-codeine (TYLENOL #3) 300-30 MG tablet Take 1 tablet by mouth every 6 (six) hours as needed for severe pain.  10/30/18   Wieters, Hallie C, PA-C  albuterol (PROVENTIL HFA;VENTOLIN HFA) 108 (90 Base) MCG/ACT inhaler Inhale 1-2 puffs into the lungs every 6 (six) hours as needed for wheezing or shortness of breath. 11/29/18   Tasia Catchings, Amy V, PA-C  Cetirizine HCl 10 MG CAPS Take 1 capsule (10 mg total) by mouth daily for 10 days. 10/30/18 11/09/18  Wieters, Hallie C, PA-C  fluticasone (FLONASE) 50 MCG/ACT nasal spray Place 1-2 sprays into both nostrils daily. 12/02/18   Wieters, Hallie C, PA-C  gabapentin (NEURONTIN) 300 MG capsule Take 300 mg by mouth at bedtime.    [provider]    GABAPENTIN PO Take by mouth.    [provider]  ipratropium (ATROVENT) 0.06 % nasal spray Place 2 sprays into both nostrils 4 (four) times daily. 11/29/18   Tasia Catchings, Amy V, PA-C  levothyroxine (SYNTHROID, LEVOTHROID) 125 MCG tablet Take 175 mcg by mouth daily.     [provider]  metoprolol succinate (TOPROL XL) 50 MG 24 hr tablet Take 25 mg by mouth daily. Take with or immediately following a meal.     [provider]    Family History History reviewed. No pertinent family history.  Social History Social History   Tobacco Use   Smoking status: Current Some Day Smoker    Packs/day: 1.00    Types: E-cigarettes   Smokeless tobacco: Never Used   Tobacco comment: vape only  Substance Use Topics   Alcohol use: No   Drug use: No     Allergies   Benadryl [diphenhydramine]; Ciprocinonide [fluocinolone]; and Nsaids   Review of Systems Review of Systems  Constitutional: Negative for fever.  Eyes: Negative for visual disturbance.  Respiratory: Negative for cough and shortness of breath.   Cardiovascular: Positive for chest pain. Negative for leg swelling.  Gastrointestinal: Positive for nausea. Negative for abdominal pain and vomiting.  Genitourinary: Negative for dysuria and hematuria.  Neurological: Positive for light-headedness and headaches. Negative for weakness and numbness.  All other systems reviewed and are negative.    Physical Exam Updated Vital Signs BP 116/86    Pulse (!) 59    Temp 98.6 F (37 C) (Oral)    Resp (!) 26    Ht 5\' 3"  (1.6 m)    Wt 99.8 kg    LMP 02/07/2019 (Exact Date)    SpO2 98%    BMI 38.97 kg/m   Physical Exam Vitals signs and nursing note reviewed.  Constitutional:      Appearance: Normal appearance. She is well-developed.  HENT:     Head: Normocephalic and atraumatic.  Eyes:     General: Lids are normal.     Conjunctiva/sclera: Conjunctivae normal.     Pupils: Pupils are equal, round, and reactive to light.      Comments: PERRL. EOMs intact without any difficulty.   Neck:     Musculoskeletal: Full passive range of motion without pain.  Cardiovascular:     Rate and Rhythm: Normal rate and regular rhythm.     Pulses: Normal pulses.          Radial pulses are 2+ on the right side and 2+ on the left side.       Dorsalis pedis pulses are 2+ on the right side and 2+ on the left side.     Heart sounds: Normal heart sounds. No murmur. No friction rub. No gallop.   Pulmonary:     Effort: Pulmonary effort is normal.     Breath  sounds: Normal breath sounds.     Comments: Lungs clear to auscultation bilaterally.  Symmetric chest rise.  No wheezing, rales, rhonchi. Abdominal:     Palpations: Abdomen is soft. Abdomen is not rigid.     Tenderness: There is no abdominal tenderness. There is no guarding.     Comments: Abdomen is soft, non-distended, non-tender. No rigidity, No guarding. No peritoneal signs.  Musculoskeletal: Normal range of motion.     Comments: Lateral lower extremities are symmetric in appearance without any overlying warmth, erythema, edema.  Skin:    General: Skin is warm and dry.     Capillary Refill: Capillary refill takes less than 2 seconds.  Neurological:     Mental Status: She is alert and oriented to person, place, and time.     Comments: Cranial nerves III-XII intact Follows commands, Moves all extremities  5/5 strength to BUE and BLE  Sensation intact throughout all major nerve distributions Normal finger to nose. No dysdiadochokinesia. No pronator drift. No gait abnormalities  No slurred speech. No facial droop.   Psychiatric:        Speech: Speech normal.      ED Treatments / Results  Labs (all labs ordered are listed, but only abnormal results are displayed) Labs Reviewed  BASIC METABOLIC PANEL - Abnormal; Notable for the following components:      Result Value   Calcium 8.7 (*)    All other components within normal limits  CBC  TROPONIN I  D-DIMER,  QUANTITATIVE (NOT AT [facility])  TSH    EKG EKG Interpretation  Date/Time:  Saturday Feb 10 2019 15:56:54 EDT Ventricular Rate:  73 PR Interval:    QRS Duration: 88 QT Interval:  373 QTC Calculation: 411 R Axis:   53 Text Interpretation:  Sinus rhythm Low voltage, precordial leads Abnormal R-wave progression, early transition Baseline wander in lead(s) II III aVF V4 Confirmed by Quintella Reichert [phone]) on 02/10/2019 4:14:30 PM   Radiology Dg Chest 2 View  Result Date: 02/10/2019 CLINICAL DATA:  Two-day history of headache, dizziness, lightheadedness and intermittent chest pain. Current vaper. Former smoker. EXAM: CHEST - 2 VIEW COMPARISON:  12/02/2018, 08/23/2012. FINDINGS: Cardiomediastinal silhouette unremarkable and unchanged. Lungs clear. Bronchovascular markings normal. Pulmonary vascularity normal. No visible pleural effusions. No pneumothorax. Mild degenerative changes involving the thoracic spine. IMPRESSION: No acute cardiopulmonary disease. Electronically Signed   By: Evangeline Dakin M.D.   On: 02/10/2019 18:04   Ct Head Wo Contrast  Result Date: 02/10/2019 CLINICAL DATA:  Two-day history of headache associated with dizziness and lightheadedness. Current history of migraines. EXAM: CT HEAD WITHOUT CONTRAST TECHNIQUE: Contiguous axial images were obtained from the base of the skull through the vertex without intravenous contrast. COMPARISON:  03/28/2015, 08/23/2012. FINDINGS: Brain: Ventricular system normal in size and appearance for age. No mass lesion. No midline shift. No acute hemorrhage or hematoma. No extra-axial fluid collections. No evidence of acute infarction. No focal brain parenchymal abnormalities. Vascular: No hyperdense vessel. No visible atherosclerosis. Skull: No skull fracture or other focal osseous abnormality involving the skull. Sinuses/Orbits: Visualized paranasal sinuses, bilateral mastoid air cells and bilateral middle ear cavities well-aerated. Visualized orbits  and globes normal in appearance. Other: None. IMPRESSION: Normal examination. Electronically Signed   By: Evangeline Dakin M.D.   On: 02/10/2019 17:55    Procedures Procedures (including critical care time)  Medications Ordered in ED Medications  gabapentin (NEURONTIN) tablet 300 mg (300 mg Oral Not Given 02/10/19 2025)  sodium chloride 0.9 % bolus  1,000 mL (0 mLs Intravenous Stopped 02/10/19 2020)  gabapentin (NEURONTIN) 300 MG capsule (300 mg  Given 02/10/19 2020)     Initial Impression / Assessment and Plan / ED Course  I have reviewed the triage vital signs and the nursing notes.  Pertinent labs & imaging results that were available during my care of the patient were reviewed by me and considered in my medical decision making (see chart for details).        38 y.o. F past medical history of antiphospholipid syndrome, hypothyroidism who presents for evaluation of 2 days of lightheadedness sensation as well as intermittent chest pain that began yesterday.  Additionally, day, she started developing a headache and some pain in her right leg.  Patient states that this feels similar to when her thyroid was low.  She has been compliant with her thyroid medication. Patient is afebrile, non-toxic appearing, sitting comfortably on examination table. Vital signs reviewed and stable.  No neuro deficits noted on exam.  Consider electrolyte imbalance versus hydration versus thyroid etiology.  At this time, she does not appear to be in thyrotoxicosis.  Low suspicion for ACS etiology as her symptom is atypical.  Also consider PE/DVT given history of antiphospholipid syndrome.  Patient states she has had migraines in the past that she has not had any for several years.  History/physical exam not concerning for CVA, intracranial hemorrhage, dural venous thrombosis, meningitis.  Additionally, do not suspect aortic dissection.  We will plan to check labs, chest x-ray.  Troponin negative.  D-dimer is negative.   BMP is unremarkable.  CBC without any significant leukocytosis or anemia.  Chest x-ray negative for any acute abnormality.  CT head negative for any acute abnormality.  Patient has been able to ambulate in the department without any difficulty several times.  Discussed with patient.  She states she does not feel lightheaded after fluids but states she still has headache.  I did offer patient a migraine cocktail but patient declined.  Discussed patient with Dr. Ralene Bathe who independently evaluated patient at this time.  At this time, work-up is reassuring.  No indication for further evaluation or treatment here in the emergency department.  TSH is pending.  Discussed with patient that she will be notified if TSH is abnormal.  Encourage patient to follow-up with her endocrinologist as directed. At this time, patient exhibits no emergent life-threatening condition that require further evaluation in ED or admission. Patient had ample opportunity for questions and discussion. All patient's questions were answered with full understanding. Strict return precautions discussed. Patient expresses understanding and agreement to plan.   Portions of this note were generated with [employer]. Dictation errors may occur despite best attempts at proofreading.   Final Clinical Impressions(s) / ED Diagnoses   Final diagnoses:  Lightheaded  Atypical chest pain    ED Discharge Orders    None       Desma Mcgregor 02/10/19 2327    Quintella Reichert, MD 02/13/19 [phone]

## 2019-02-10 NOTE — ED Notes (Signed)
Pt also c/o leg pain and headache in addition to chest pain

## 2019-02-10 NOTE — ED Triage Notes (Addendum)
Pt c/o chest pain that started yesterday when she was driving. Pt states she feels sharp "shock pains" in left side of her chest. Pt also c/o leg pain and headache, states she is concerned of "blood clot".

## 2019-02-10 NOTE — ED Notes (Signed)
ED Provider at bedside. 

## 2019-02-10 NOTE — Discharge Instructions (Signed)
As we discussed today, your lab work looked reassuring.  Your thyroid levels are pending.  If they are abnormal, you will be called.  Make sure you are taking her thyroid medication.  Follow-up with your primary care doctor.  If you do not have a primary care doctor, I provided a referral to [facility] wellness clinic.  Return the emergency department for any worsening pain, difficulty walking, difficulty breathing, chest pain or any other worsening or concerning symptoms.

## 2019-02-13 ENCOUNTER — Encounter: Payer: Self-pay | Admitting: Family Medicine

## 2019-02-15 ENCOUNTER — Telehealth (INDEPENDENT_AMBULATORY_CARE_PROVIDER_SITE_OTHER): Payer: Self-pay | Admitting: Family Medicine

## 2019-02-15 DIAGNOSIS — E039 Hypothyroidism, unspecified: Secondary | ICD-10-CM

## 2019-02-15 DIAGNOSIS — R000 Tachycardia, unspecified: Secondary | ICD-10-CM

## 2019-02-15 DIAGNOSIS — R7989 Other specified abnormal findings of blood chemistry: Secondary | ICD-10-CM

## 2019-02-15 NOTE — Progress Notes (Deleted)
Worked up patient for her MyChart video visit with provider Molli Barrows, FNP-C. Verified DOB. Patient establishing & following up from ED visit. States that she only has the chest pain while driving. Thinks it may be positional. KWalker, CMA.

## 2019-02-19 NOTE — Progress Notes (Signed)
Virtual Visit via Video Note  I connected with Cassidy [name] on 02/19/19 at  4:10 PM EDT by a video enabled telemedicine application and verified that I am speaking with the correct person using two identifiers.  Location: Patient: Located at home during today's encounter  Provider: Located at primary care office    I discussed the limitations of evaluation and management by telemedicine and the availability of in person appointments. The patient expressed understanding and agreed to proceed.  History of Present Illness: Cassidy [name] medical problems are significant for hypothyroidism and antiphospholipid syndrome. Previously followed by endocrinology, NP. Cassidy [name], at [facility] and received primary care at [facility]. She wishes to establish care here today.  On 02/10/19, Cassidy [name] presented to the ER at [facility] med center with a complaint of lightheadedness and chest pain. She was found to have subtherapeutic TSH level 0.165. She has not followed up with endocrinology since 06/26/2018. She is uninsured and pays out of pocket for follow-up  visits. Currently taking Levothyroxine 175 mcg and has been on current dose for 1 year. Last TSH level 0.714 06/26/2018. She takes metoprolol for history of sinus tachycardia. No longer experiencing chest pain. She is experiencing pain left shoulder/arm which she has identoifies appears when she is driving. 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 Denies diaphoresis, dizziness, weakness, headache, or palpitation.   Family History  Problem Relation Age of Onset  . Hyperlipidemia Mother   . Supraventricular tachycardia Mother   . Hyperlipidemia Father   . Healthy Brother    Social History   Socioeconomic History  . Marital status: Married    Spouse name: Not on file  . Number of children: Not on file  . Years of education: Not on file  . Highest education level: Not on file  Occupational History  . Not on file   Social Needs  . Financial resource strain: Not on file  . Food insecurity:    Worry: Not on file    Inability: Not on file  . Transportation needs:    Medical: Not on file    Non-medical: Not on file  Tobacco Use  . Smoking status: Current Some Day Smoker    Packs/day: 1.00    Types: E-cigarettes  . Smokeless tobacco: Never Used  . Tobacco comment: vape only  Substance and Sexual Activity  . Alcohol use: No  . Drug use: No  . Sexual activity: Yes  Lifestyle  . Physical activity:    Days per week: Not on file    Minutes per session: Not on file  . Stress: Not on file  Relationships  . Social connections:    Talks on phone: Not on file    Gets together: Not on file    Attends religious service: Not on file    Active member of club or organization: Not on file    Attends meetings of clubs or organizations: Not on file    Relationship status: Not on file  . Intimate partner violence:    Fear of current or ex partner: Not on file    Emotionally abused: Not on file    Physically abused: Not on file    Forced sexual activity: Not on file  Other Topics Concern  . Not on file  Social History Narrative  . Not on file     Assessment and Plan: 1. Abnormal serum thyroid stimulating hormone (TSH) level 2. Acquired hypothyroidism -Patient will  schedule a follow-up with endocrinology. If unable to be seen, she will have thyroid checked at her CPE appointment 02/26/19.  3. Sinus tachycardia -Continue Metoprolol   Follow Up Instructions: Schedule CPE 1-2 weeks   I discussed the assessment and treatment plan with the patient. The patient was provided an opportunity to ask questions and all were answered. The patient agreed with the plan and demonstrated an understanding of the instructions.   The patient was advised to call back or seek an in-person evaluation if the symptoms worsen or if the condition fails to improve as anticipated.  I provided 30 minutes of non-face-to-face  time during this encounter.   Molli Barrows, FNP

## 2019-02-21 ENCOUNTER — Encounter: Payer: Self-pay | Admitting: Family Medicine

## 2019-02-26 ENCOUNTER — Encounter: Payer: Self-pay | Admitting: Family Medicine

## 2019-02-26 ENCOUNTER — Ambulatory Visit (INDEPENDENT_AMBULATORY_CARE_PROVIDER_SITE_OTHER): Payer: Self-pay | Admitting: Family Medicine

## 2019-02-26 ENCOUNTER — Other Ambulatory Visit: Payer: Self-pay

## 2019-02-26 VITALS — BP 126/91 | HR 80 | Temp 98.1°F | Resp 17 | Ht 63.0 in | Wt 222.4 lb

## 2019-02-26 DIAGNOSIS — F17200 Nicotine dependence, unspecified, uncomplicated: Secondary | ICD-10-CM

## 2019-02-26 DIAGNOSIS — Z131 Encounter for screening for diabetes mellitus: Secondary | ICD-10-CM

## 2019-02-26 DIAGNOSIS — Z1389 Encounter for screening for other disorder: Secondary | ICD-10-CM

## 2019-02-26 DIAGNOSIS — Z0000 Encounter for general adult medical examination without abnormal findings: Secondary | ICD-10-CM

## 2019-02-26 DIAGNOSIS — Z13220 Encounter for screening for lipoid disorders: Secondary | ICD-10-CM

## 2019-02-26 DIAGNOSIS — R928 Other abnormal and inconclusive findings on diagnostic imaging of breast: Secondary | ICD-10-CM

## 2019-02-26 LAB — POCT URINALYSIS DIP (CLINITEK)
Bilirubin, UA: NEGATIVE
Glucose, UA: NEGATIVE mg/dL
Ketones, POC UA: NEGATIVE mg/dL
Leukocytes, UA: NEGATIVE
Nitrite, UA: NEGATIVE
POC PROTEIN,UA: NEGATIVE
Spec Grav, UA: 1.02 (ref 1.010–1.025)
Urobilinogen, UA: 0.2 E.U./dL
pH, UA: 6.5 (ref 5.0–8.0)

## 2019-02-26 NOTE — Patient Instructions (Addendum)
Thank you for choosing Primary Care at [facility] for your medical home!    Cassidy [name] was seen by Molli Barrows, FNP today.   Cassidy [name]'s primary care doctor is Scot Jun, FNP.   For the best care possible,  you should try to see Molli Barrows, FNP-C  whenever you come to clinic.   We look forward to seeing you again soon!  If you have any questions about your visit today,  please call us at [phone]  Or feel free to reach your provider via [facility].    Keeping You Healthy  Get These Tests 1. Blood Pressure- Have your blood pressure checked once a year by your health care provider.  Normal blood pressure is 120/80. 2. Weight- Have your body mass index (BMI) calculated to screen for obesity.  BMI is measure of body fat based on height and weight.  You can also calculate your own BMI at GravelBags.it. 3. Cholesterol- Have your cholesterol checked every 5 years starting at age 58 then yearly starting at age 54. 41. Chlamydia, HIV, and other sexually transmitted diseases- Get screened every year until age 18, then within three months of each new sexual provider. 5. Pap Test - Every 1-5 years; discuss with your health care provider. 6. Mammogram- Every 1-2 years starting at age 72--50  Take these medicines  Calcium with Vitamin D-Your body needs 1200 mg of Calcium each day and [phone] IU of Vitamin D daily.  Your body can only absorb 500 mg of Calcium at a time so Calcium must be taken in 2 or 3 divided doses throughout the day.  Multivitamin with folic acid- Once daily if it is possible for you to become pregnant.  Get these Immunizations  Gardasil-Series of three doses; prevents HPV related illness such as genital warts and cervical cancer.  Menactra-Single dose; prevents meningitis.  Tetanus shot- Every 10 years.  Flu shot-Every year.  Take these steps 1. Do not smoke-Your healthcare provider can help you quit.  For tips on how to quit go to  www.smokefree.gov or call 1-800 QUITNOW. 2. Be physically active- Exercise 5 days a week for at least 30 minutes.  If you are not already physically active, start slow and gradually work up to 30 minutes of moderate physical activity.  Examples of moderate activity include walking briskly, dancing, swimming, bicycling, etc. 3. Breast Cancer- A self breast exam every month is important for early detection of breast cancer.  For more information and instruction on self breast exams, ask your healthcare provider or https://www.patel.info/. 4. Eat a healthy diet- Eat a variety of healthy foods such as fruits, vegetables, whole grains, low fat milk, low fat cheeses, yogurt, lean meats, poultry and fish, beans, nuts, tofu, etc.  For more information go to www. Thenutritionsource.org 5. Drink alcohol in moderation- Limit alcohol intake to one drink or less per day. Never drink and drive. 6. Depression- Your emotional health is as important as your physical health.  If you're feeling down or losing interest in things you normally enjoy please talk to your healthcare provider about being screened for depression. 7. Dental visit- Brush and floss your teeth twice daily; visit your dentist twice a year. 8. Eye doctor- Get an eye exam at least every 2 years. 9. Helmet use- Always wear a helmet when riding a bicycle, motorcycle, rollerblading or skateboarding. 1. Safe sex- If you may be exposed to sexually transmitted infections, use a condom. 11. Seat belts- Seat belts can save your  live; always wear one. 12. Smoke/Carbon Monoxide detectors- These detectors need to be installed on the appropriate level of your home. Replace batteries at least once a year. 13. Skin cancer- When out in the sun please cover up and use sunscreen 15 SPF or higher. 14. Violence- If anyone is threatening or hurting you, please tell your healthcare provider.

## 2019-02-26 NOTE — Progress Notes (Signed)
Patient ID: Cassidy [name], female    DOB: 11/07/1980, 38 y.o.   MRN: [id number]  PCP: Scot Jun, FNP  Chief Complaint  Patient presents with  . Annual Exam    Subjective:  HPI Cassidy [name] is a 38 y.o. female, e-cigarette smoker presents for complete physical exam.  Chronic conditions include: hypothyroidism, positive antiphospholipid antibody   Followed by endocrinology for management of hypothyroidism.  Social History   Socioeconomic History  . Marital status: Married    Spouse name: Not on file  . Number of children: Not on file  . Years of education: Not on file  . Highest education level: Not on file  Occupational History  . Not on file  Social Needs  . Financial resource strain: Not on file  . Food insecurity:    Worry: Not on file    Inability: Not on file  . Transportation needs:    Medical: Not on file    Non-medical: Not on file  Tobacco Use  . Smoking status: Current Some Day Smoker    Packs/day: 1.00    Types: E-cigarettes  . Smokeless tobacco: Never Used  . Tobacco comment: vape only  Substance and Sexual Activity  . Alcohol use: No  . Drug use: No  . Sexual activity: Yes    Partners: Female    Birth control/protection: None  Lifestyle  . Physical activity:    Days per week: Not on file    Minutes per session: Not on file  . Stress: Not on file  Relationships  . Social connections:    Talks on phone: Not on file    Gets together: Not on file    Attends religious service: Not on file    Active member of club or organization: Not on file    Attends meetings of clubs or organizations: Not on file    Relationship status: Not on file  . Intimate partner violence:    Fear of current or ex partner: Not on file    Emotionally abused: Not on file    Physically abused: Not on file    Forced sexual activity: Not on file  Other Topics Concern  . Not on file  Social History Narrative  . Not on file    Family History  Problem Relation Age of  Onset  . Hyperlipidemia Mother   . Supraventricular tachycardia Mother   . Hyperlipidemia, CABG, carotidstenosis  Father    Breast Cancer  MGM and Maternal aunts    . Healthy Brother    Skin cancer basal cell (several) nevi, melanoma Patient had several nevi removed.    Health Promotion: Routine physical activity:  Current BMI: Body mass index is 39.4 kg/m. Dietary habits:  Health Screening Current/Overdue:   Immunizations-current  PAP-current. Last PAP normal  Mammogram, began at age 59 Last Dental Exam: Within 1 week. Last Eye Exam: no exam. Any visual acuity changes: No.  Current home medications include: Prior to Admission medications   Medication Sig Start Date End Date Taking? Authorizing Provider  gabapentin (NEURONTIN) 300 MG capsule Take 300 mg by mouth at bedtime.   Yes [provider]  levothyroxine (SYNTHROID) 175 MCG tablet TAKE ONE TABLET BY MOUTH DAILY 12/25/18  Yes [provider]  metoprolol succinate (TOPROL-XL) 25 MG 24 hr tablet Take 25 mg by mouth daily.   Yes [provider]    Family History  Problem Relation Age of Onset  . Hyperlipidemia Mother   . Supraventricular tachycardia Mother   .  Hyperlipidemia Father   . Healthy Brother     Allergies  Allergen Reactions  . Benadryl [Diphenhydramine] Other (See Comments)    "restless leg, so I need gabapentin with it"  . Ciprocinonide [Fluocinolone] Other (See Comments)    Muscle aches  . Ciprofloxacin     Other reaction(s): Other (See Comments)  . Ibuprofen Other (See Comments)  . Nsaids Hives  . Tolmetin Hives    Social History   Socioeconomic History  . Marital status: Married    Spouse name: Not on file  . Number of children: Not on file  . Years of education: Not on file  . Highest education level: Not on file  Occupational History  . Not on file  Social Needs  . Financial resource strain: Not on file  . Food insecurity:    Worry: Not on file     Inability: Not on file  . Transportation needs:    Medical: Not on file    Non-medical: Not on file  Tobacco Use  . Smoking status: Current Some Day Smoker    Packs/day: 1.00    Types: E-cigarettes  . Smokeless tobacco: Never Used  . Tobacco comment: vape only  Substance and Sexual Activity  . Alcohol use: No  . Drug use: No  . Sexual activity: Yes  Lifestyle  . Physical activity:    Days per week: Not on file    Minutes per session: Not on file  . Stress: Not on file  Relationships  . Social connections:    Talks on phone: Not on file    Gets together: Not on file    Attends religious service: Not on file    Active member of club or organization: Not on file    Attends meetings of clubs or organizations: Not on file    Relationship status: Not on file  . Intimate partner violence:    Fear of current or ex partner: Not on file    Emotionally abused: Not on file    Physically abused: Not on file    Forced sexual activity: Not on file  Other Topics Concern  . Not on file  Social History Narrative  . Not on file   Review of Systems Pertinent negatives listed in HPI Past Medical, Surgical Family and Social History reviewed and updated.  Objective:   Today's Vitals   02/26/19 1345  BP: (!) 126/91  Pulse: 80  Resp: 17  Temp: 98.1 F (36.7 C)  TempSrc: Temporal  SpO2: 98%  Weight: 222 lb 6.4 oz (100.9 kg)  Height: 5\' 3"  (1.6 m)    Wt Readings from Last 3 Encounters:  02/26/19 222 lb 6.4 oz (100.9 kg)  02/10/19 220 lb (99.8 kg)  12/02/18 220 lb (99.8 kg)   Physical Exam  Physical Exam: Constitutional: Patient appears well-developed and well-nourished. No distress. HENT: Normocephalic, atraumatic, External right and left ear normal. Oropharynx is clear and moist.  Eyes: Conjunctivae and EOM are normal. PERRLA, no scleral icterus. Neck: Normal ROM. Neck supple. No JVD. No tracheal deviation. No thyromegaly. CVS: RRR, S1/S2 +, no murmurs, no gallops, no carotid  bruit.  Pulmonary: Effort and breath sounds normal, no stridor, rhonchi, wheezes, rales.  Abdominal: Soft. BS +, no distension, tenderness, rebound or guarding.  Musculoskeletal: Normal range of motion. No edema and no tenderness.  Lymphadenopathy: No lymphadenopathy noted, cervical, inguinal or axillary Neuro: Alert. Normal reflexes, muscle tone coordination. No cranial nerve deficit. Skin: Skin is warm and dry. No  rash noted. Not diaphoretic. No erythema. No pallor. Psychiatric: Normal mood and affect. Behavior, judgment, thought content normal.     Assessment & Plan:  1. Annual physical exam Age appropriate anticipatory guidance provided   2. Lipid screening, hx of elevated lipids  I recommend lifestyle changes such as engaging in routine physical activity with a goal of 150 minutes per week, increasing intake of vegetables, fruits, fiber, and selecting lean cuts of meat.  - Lipid Panel  3. Screening for blood or protein in urine - POCT URINALYSIS DIP (CLINITEK), scant blood, otherwise unremarkable   4. Diabetes mellitus screening - Comprehensive metabolic panel - Hemoglobin A1c     Molli Barrows, FNP Primary Care at [facility] [address] [place] [phone]: [phone]

## 2019-02-27 LAB — COMPREHENSIVE METABOLIC PANEL
ALT: 11 IU/L (ref 0–32)
AST: 17 IU/L (ref 0–40)
Albumin/Globulin Ratio: 1.6 (ref 1.2–2.2)
Albumin: 4.2 g/dL (ref 3.8–4.8)
Alkaline Phosphatase: 53 IU/L (ref 39–117)
BUN/Creatinine Ratio: 11 (ref 9–23)
BUN: 9 mg/dL (ref 6–20)
Bilirubin Total: 0.7 mg/dL (ref 0.0–1.2)
CO2: 19 mmol/L — ABNORMAL LOW (ref 20–29)
Calcium: 8.9 mg/dL (ref 8.7–10.2)
Chloride: 105 mmol/L (ref 96–106)
Creatinine, Ser: 0.8 mg/dL (ref 0.57–1.00)
GFR calc Af Amer: 107 mL/min/{1.73_m2} (ref >59)
GFR calc non Af Amer: 93 mL/min/{1.73_m2} (ref >59)
Globulin, Total: 2.6 g/dL (ref 1.5–4.5)
Glucose: 100 mg/dL — ABNORMAL HIGH (ref 65–99)
Potassium: 4.1 mmol/L (ref 3.5–5.2)
Sodium: 135 mmol/L (ref 134–144)
Total Protein: 6.8 g/dL (ref 6.0–8.5)

## 2019-02-27 LAB — HEMOGLOBIN A1C
Est. average glucose Bld gHb Est-mCnc: 108 mg/dL
Hgb A1c MFr Bld: 5.4 % (ref 4.8–5.6)

## 2019-02-27 LAB — LIPID PANEL
Chol/HDL Ratio: 4 ratio (ref 0.0–4.4)
Cholesterol, Total: 183 mg/dL (ref 100–199)
HDL: 46 mg/dL (ref >39)
LDL Calculated: 122 mg/dL — ABNORMAL HIGH (ref 0–99)
Triglycerides: 76 mg/dL (ref 0–149)
VLDL Cholesterol Cal: 15 mg/dL (ref 5–40)

## 2019-03-05 ENCOUNTER — Encounter: Payer: Self-pay | Admitting: Family Medicine

## 2019-03-05 MED ORDER — GABAPENTIN 300 MG PO CAPS: 300.0000 mg | ORAL_CAPSULE | Freq: Every day | ORAL | 2 refills | Status: DC

## 2019-03-05 MED ORDER — METOPROLOL SUCCINATE ER 25 MG PO TB24: 25.0000 mg | ORAL_TABLET | Freq: Every day | ORAL | 2 refills | Status: DC

## 2019-05-28 ENCOUNTER — Telehealth: Payer: Self-pay | Admitting: Family Medicine

## 2019-05-28 NOTE — Telephone Encounter (Signed)
error 

## 2019-05-29 ENCOUNTER — Other Ambulatory Visit: Payer: Self-pay

## 2019-05-29 ENCOUNTER — Ambulatory Visit (INDEPENDENT_AMBULATORY_CARE_PROVIDER_SITE_OTHER): Payer: Self-pay | Admitting: Internal Medicine

## 2019-05-29 VITALS — BP 125/82 | HR 92 | Temp 97.5°F | Resp 17 | Wt 221.8 lb

## 2019-05-29 DIAGNOSIS — Z87898 Personal history of other specified conditions: Secondary | ICD-10-CM

## 2019-05-29 DIAGNOSIS — R000 Tachycardia, unspecified: Secondary | ICD-10-CM

## 2019-05-29 DIAGNOSIS — I10 Essential (primary) hypertension: Secondary | ICD-10-CM

## 2019-05-29 DIAGNOSIS — G2581 Restless legs syndrome: Secondary | ICD-10-CM

## 2019-05-29 DIAGNOSIS — Z6839 Body mass index (BMI) 39.0-39.9, adult: Secondary | ICD-10-CM

## 2019-05-29 NOTE — Patient Instructions (Signed)

## 2019-05-29 NOTE — Progress Notes (Signed)
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 She needs the letter to state that she is on the Metoprolol for tachycardia instead of being on it for BP management. She also wants to discuss the Gabapentin. States that she takes it about 3 times a week for restless leg.

## 2019-05-29 NOTE — Progress Notes (Signed)
Patient ID: Cassidy [name], female    DOB: 28-Sep-1980  MRN: [id number]  CC: Medication Management   Subjective: Cassidy [name] is a 38 y.o. female who presents for UC visit. 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  Just offered job with [employer].  They required her to have DOT physical.  They will question the reason for her being on Gabapentin and Metoprolol and will need a letter from her doctor addressing it.  She tells me that she takes  Gabapentin for RLS and has been on it for about 6 yrs and takes it about 2 x a wk.   Placed on Metoprolol about 17 yrs ago for tachycardia.  She was on higher dose initially but tapered down to 25 mg because BP was too low.  Saw cardiology once and was told that her PCP can manage her Metoprolol.  She has not had any problems with tachycardia in a years.  Never had any syncope or near syncope.  Followed by endocrinologist for hypothyroidism  Hx of APS.  Discover when she had a still birth 63 yrs ago.  Not on any med for this.     Current Outpatient Medications on File Prior to Visit  Medication Sig Dispense Refill  . gabapentin (NEURONTIN) 300 MG capsule Take 1 capsule (300 mg total) by mouth at bedtime. 90 capsule 2  . levothyroxine (SYNTHROID) 175 MCG tablet TAKE ONE TABLET BY MOUTH DAILY    . metoprolol succinate (TOPROL-XL) 25 MG 24 hr tablet Take 1 tablet (25 mg total) by mouth daily. 90 tablet 2   No current facility-administered medications on file prior to visit.     Allergies  Allergen Reactions  . Statins   . Benadryl [Diphenhydramine] Other (See Comments)    "restless leg, so I need gabapentin with it"  . Ciprocinonide [Fluocinolone] Other (See Comments)    Muscle aches  . Ciprofloxacin     Other reaction(s): Other (See Comments)  . Ibuprofen Other (See Comments)  . Nsaids Hives  . Tolmetin Hives    Social History   Socioeconomic History  . Marital  status: Married    Spouse name: Not on file  . Number of children: Not on file  . Years of education: Not on file  . Highest education level: Not on file  Occupational History  . Not on file  Social Needs  . Financial resource strain: Not on file  . Food insecurity    Worry: Not on file    Inability: Not on file  . Transportation needs    Medical: Not on file    Non-medical: Not on file  Tobacco Use  . Smoking status: Current Some Day Smoker    Packs/day: 1.00    Types: E-cigarettes  . Smokeless tobacco: Never Used  . Tobacco comment: vape only  Substance and Sexual Activity  . Alcohol use: No  . Drug use: No  . Sexual activity: Yes    Partners: Female    Birth control/protection: None  Lifestyle  . Physical activity    Days per week: Not on file    Minutes per session: Not on file  . Stress: Not on file  Relationships  . Social Herbalist on phone: Not on file    Gets together: Not on file    Attends religious service: Not on file    Active member of club or organization: Not  on file    Attends meetings of clubs or organizations: Not on file    Relationship status: Not on file  . Intimate partner violence    Fear of current or ex partner: Not on file    Emotionally abused: Not on file    Physically abused: Not on file    Forced sexual activity: Not on file  Other Topics Concern  . Not on file  Social History Narrative  . Not on file    Family History  Problem Relation Age of Onset  . Hyperlipidemia Mother   . Supraventricular tachycardia Mother   . Hyperlipidemia Father   . Healthy Brother     Past Surgical History:  Procedure Laterality Date  . TONSILLECTOMY      ROS: Review of Systems Negative except as stated above  PHYSICAL EXAM: BP 125/82   Pulse 92   Temp (!) 97.5 F (36.4 C) (Temporal)   Resp 17   Wt 221 lb 12.8 oz (100.6 kg)   SpO2 98%   BMI 39.29 kg/m   Physical Exam  General appearance - alert, well appearing,  middle-aged obese Caucasian female and in no distress Mental status - alert, oriented to person, place, and time Mouth - mucous membranes moist, pharynx normal without lesions Neck - supple, no significant adenopathy Chest - clear to auscultation, no wheezes, rales or rhonchi, symmetric air entry Heart - normal rate, regular rhythm, normal S1, S2, no murmurs, rubs, clicks or gallops Extremities - peripheral pulses normal, no pedal edema, no clubbing or cyanosis   CMP Latest Ref Rng & Units 02/26/2019 02/10/2019 06/12/2014  Glucose 65 - 99 mg/dL 100(H) 92 95  BUN 6 - 20 mg/dL 9 14 12   Creatinine 0.57 - 1.00 mg/dL 0.80 0.87 0.90  Sodium 134 - 144 mmol/L 135 137 138  Potassium 3.5 - 5.2 mmol/L 4.1 3.8 4.1  Chloride 96 - 106 mmol/L 105 106 100  CO2 20 - 29 mmol/L 19(L) 22 24  Calcium 8.7 - 10.2 mg/dL 8.9 8.7(L) 9.8  Total Protein 6.0 - 8.5 g/dL 6.8 - 8.2  Total Bilirubin 0.0 - 1.2 mg/dL 0.7 - 0.3  Alkaline Phos 39 - 117 IU/L 53 - 62  AST 0 - 40 IU/L 17 - 19  ALT 0 - 32 IU/L 11 - 22   Lipid Panel     Component Value Date/Time   CHOL 183 02/26/2019 1433   TRIG 76 02/26/2019 1433   HDL 46 02/26/2019 1433   CHOLHDL 4.0 02/26/2019 1433   LDLCALC 122 (H) 02/26/2019 1433    CBC    Component Value Date/Time   WBC 8.7 02/10/2019 1557   RBC 4.73 02/10/2019 1557   HGB 12.5 02/10/2019 1557   HCT 40.3 02/10/2019 1557   PLT 299 02/10/2019 1557   MCV 85.2 02/10/2019 1557   MCH 26.4 02/10/2019 1557   MCHC 31.0 02/10/2019 1557   RDW 15.0 02/10/2019 1557   LYMPHSABS 2.8 06/12/2014 2052   MONOABS 0.7 06/12/2014 2052   EOSABS 0.2 06/12/2014 2052   BASOSABS 0.0 06/12/2014 2052    ASSESSMENT AND PLAN:  1. RLS (restless legs syndrome) 2. History of tachycardia Patient informed that I can write a letter stating the reason that she is on gabapentin and metoprolol.  I do not think interim should hinder her being able to perform her duties at her job.  3. Essential hypertension, benign I note  that diastolic blood pressure has been elevated on previous visits as recorded in  EMR.  She is on low-dose of metoprolol which I think helps.  DASH diet discussed and encouraged.  4. BMI 39.0-39.9,adult Discussed healthy eating habits and the importance of getting in moderate intensity exercise at least about 150 minutes/week total    Patient was given the opportunity to ask questions.  Patient verbalized understanding of the plan and was able to repeat key elements of the plan.   No orders of the defined types were placed in this encounter.    Requested Prescriptions    No prescriptions requested or ordered in this encounter    Return in about 3 months (around 08/28/2019).  Karle Plumber, MD, FACP

## 2019-08-06 DIAGNOSIS — E039 Hypothyroidism, unspecified: Secondary | ICD-10-CM | POA: Diagnosis not present

## 2019-09-03 ENCOUNTER — Other Ambulatory Visit: Payer: Self-pay

## 2019-09-03 ENCOUNTER — Telehealth (INDEPENDENT_AMBULATORY_CARE_PROVIDER_SITE_OTHER): Payer: BC Managed Care – PPO | Admitting: Family Medicine

## 2019-09-03 DIAGNOSIS — R000 Tachycardia, unspecified: Secondary | ICD-10-CM | POA: Insufficient documentation

## 2019-09-03 DIAGNOSIS — E039 Hypothyroidism, unspecified: Secondary | ICD-10-CM | POA: Insufficient documentation

## 2019-09-03 DIAGNOSIS — G2581 Restless legs syndrome: Secondary | ICD-10-CM | POA: Insufficient documentation

## 2019-09-03 MED ORDER — METOPROLOL SUCCINATE ER 25 MG PO TB24: 25.0000 mg | ORAL_TABLET | Freq: Every day | ORAL | 2 refills | Status: DC

## 2019-09-03 MED ORDER — GABAPENTIN 300 MG PO CAPS: 300.0000 mg | ORAL_CAPSULE | Freq: Every day | ORAL | 2 refills | Status: DC

## 2019-09-03 NOTE — Progress Notes (Signed)
Virtual Visit via Video Note  I connected with Cassidy [name] on 09/03/19 at 11:35 EST by a video enabled telemedicine application and verified that I am speaking with the correct person using two identifiers.  Location: Patient: Home Provider: [place] Office   I discussed the limitations of evaluation and management by telemedicine and the availability of in person appointments. The patient expressed understanding and agreed to proceed.  History of Present Illness:         37 year old female who states that she is feeling much better at this time.  She has lost about 15 pounds since her last visit.  Patient thought that today's visit was for her annual physical but she will reschedule for annual physical in January or February as today's visit was switched to a video visit due to increase in local COVID-19 cases.  She reports that she needs refill of metoprolol.  Per chart she was placed on this medication in the past for tachycardia.  She reports no further issues with increased heart rate.  She is tolerating the medication without any increase in fatigue.  She also needs refill of gabapentin which she takes for restless leg syndrome and she reports that she does not take the medication daily but as needed.  She reports recent follow-up with her endocrinologist (Meagan Younts, ANP at [facility] [employer]).  Overall she feels very well, she denies any headaches or dizziness, no chest pain or palpitations, no shortness of breath or cough and no abdominal pain.    Patient Active Problem List   Diagnosis Date Noted  . Sinus tachycardia 09/03/2019  . RLS (restless legs syndrome) 09/03/2019  . Acquired hypothyroidism 09/03/2019   Social History   Tobacco Use  . Smoking status: Current Some Day Smoker    Packs/day: 1.00    Types: E-cigarettes  . Smokeless tobacco: Never Used  . Tobacco comment: vape only  Substance Use Topics  . Alcohol use: No  . Drug use: No   Currently employed as a tow truck driver  Family History  Problem Relation Age of Onset  . Hyperlipidemia Mother   . Supraventricular tachycardia Mother   . Hyperlipidemia Father   . Healthy Brother    Allergies  Allergen Reactions  . Statins   . Benadryl [Diphenhydramine] Other (See Comments)    "restless leg, so I need gabapentin with it"  . Ciprocinonide [Fluocinolone] Other (See Comments)    Muscle aches  . Ciprofloxacin     Other reaction(s): Other (See Comments)  . Ibuprofen Other (See Comments)  . Nsaids Hives  . Tolmetin Hives                    Observations/Objective: Well-nourished well-developed female in no acute distress.  Normal mood and affect.  No evidence of shortness of breath with talking.  She appears to be in good health  Assessment and Plan: 1. Sinus tachycardia She has history of sinus tachycardia which is controlled with daily use of metoprolol.  Refills provided.  She also reports recent follow-up with endocrinology regarding her hypothyroidism.  On documents from outside health system, patient with blood pressure of 123/79 and pulse of 77 on 08/06/2019. - metoprolol succinate (TOPROL-XL) 25 MG 24 hr tablet; Take 1 tablet (25 mg total) by mouth daily.  Dispense: 90 tablet; Refill: 2  2. RLS (restless legs syndrome) Refill provided of gabapentin for patient to take when needed for treatment of restless legs syndrome. - gabapentin (NEURONTIN)  300 MG capsule; Take 1 capsule (300 mg total) by mouth at bedtime.  Dispense: 90 capsule; Refill: 2  Health maintenance: Patient may call the office to ask for nurse visit to receive influenza immunization at her convenience  Follow Up Instructions: Schedule 82-month follow-up of chronic issues and reschedule annual well exam at your convenience.    I discussed the assessment and treatment plan with the patient. The patient was provided an opportunity to ask questions and all were answered. The patient agreed with  the plan and demonstrated an understanding of the instructions.   The patient was advised to call back or seek an in-person evaluation if the symptoms worsen or if the condition fails to improve as anticipated.  I provided 11 minutes of non-face-to-face (video visit in place of in-person visit) time during this encounter.   Antony Blackbird, MD

## 2019-09-03 NOTE — Progress Notes (Signed)
Chronic condition follow up. States that she has been doing good. Doesn't need refills at this time.

## 2019-09-17 ENCOUNTER — Ambulatory Visit: Payer: BC Managed Care – PPO | Attending: Internal Medicine

## 2019-09-17 ENCOUNTER — Telehealth: Payer: BC Managed Care – PPO | Admitting: Family

## 2019-09-17 DIAGNOSIS — Z20822 Contact with and (suspected) exposure to covid-19: Secondary | ICD-10-CM

## 2019-09-17 DIAGNOSIS — Z20828 Contact with and (suspected) exposure to other viral communicable diseases: Secondary | ICD-10-CM | POA: Diagnosis not present

## 2019-09-17 NOTE — Progress Notes (Signed)
E-Visit for Corona Virus Screening   Your current symptoms could be consistent with the coronavirus.  Many health care providers can now test patients at their office but not all are.  [facility] has multiple testing sites. For information on our [facility] testing locations and hours go to [email or web address]  We are enrolling you in our [place] for [place] . Daily you will receive a questionnaire within the [facility] website. Our COVID 19 response team will be monitoring your responses daily.  Testing Information: The COVID-19 Community Testing sites will begin testing BY APPOINTMENT ONLY.  You can schedule online at [email or web address]  If you do not have access to a smart phone or computer you may call [phone] for an appointment.  Testing Locations: Appointment schedule is 8 am to 3:30 pm at all sites  [facility] indoors at [address] [place] [address] [facility]  indoors at [place]. [address], [place], [place] [address] [place] indoors at [address], [place] [address]  Additional testing sites in the Community:  . For [facility] Testing sites in [facility]  FaceUpdate.uy  . For Pop-up testing sites in [place]  BowlDirectory.co.uk  . For Testing sites with regular hours https://onsms.org/[email or web address]  . For [place] [email or web address].si  . For [facility] BasicJet.ca  . For [facility] testing in [place] and [employer] BasicJet.[email or web address]  . For [facility] testing in [facility]   [email or web address].care/[email or web address]  For  more  information about community testing call [phone]   We are enrolling you in our [place] for [place] . Daily you will receive a questionnaire within the [facility] website. Our COVID 19 response team will be monitoring your responses daily.  Please quarantine yourself while awaiting your test results. If you develop fever/cough/breathlessness, please stay home for 10 days with improving symptoms and until you have had 24 hours of no fever (without taking a fever reducer).  You should wear a mask or cloth face covering over your nose and mouth if you must be around other people or animals, including pets (even at home). Try to stay at least 6 feet away from other people. This will protect the people around you.  Please continue good preventive care measures, including:  frequent hand-washing, avoid touching your face, cover coughs/sneezes, stay out of crowds and keep a 6 foot distance from others.  COVID-19 is a respiratory illness with symptoms that are similar to the flu. Symptoms are typically mild to moderate, but there have been cases of severe illness and death due to the virus.   The following symptoms may appear 2-14 days after exposure: . Fever . Cough . Shortness of breath or difficulty breathing . Chills . Repeated shaking with chills . Muscle pain . Headache . Sore throat . New loss of taste or smell . Fatigue . Congestion or runny nose . Nausea or vomiting . Diarrhea  Go to the nearest hospital ED for assessment if fever/cough/breathlessness are severe or illness seems like a threat to life.  It is vitally important that if you feel that you have an infection such as this virus or any other virus that you stay home and away from places where you may spread it to others.  You should avoid contact with people age 47 and older.    You may also take acetaminophen (Tylenol) as needed for fever.  Reduce your risk of any infection by using the same precautions used for  avoiding the common cold or flu:  Marland Kitchen Wash your hands often with soap and warm water for at least 20 seconds.  If soap and water are not readily available, use an alcohol-based hand sanitizer with at least 60% alcohol.  . If coughing or sneezing, cover your mouth and nose by coughing or sneezing into the elbow areas of your shirt or coat, into a tissue or into your sleeve (not your hands). . Avoid shaking hands with others and consider head nods or verbal greetings only. . Avoid touching your eyes, nose, or mouth with unwashed hands.  . Avoid close contact with people who are sick. . Avoid places or events with large numbers of people in one location, like concerts or sporting events. . Carefully consider travel plans you have or are making. . If you are planning any travel outside or inside the [place], visit the CDC's Travelers' Health webpage for the latest health notices. . If you have some symptoms but not all symptoms, continue to monitor at home and seek medical attention if your symptoms worsen. . If you are having a medical emergency, call 911.  HOME CARE . Only take medications as instructed by your medical team. . Drink plenty of fluids and get plenty of rest. . A steam or ultrasonic humidifier can help if you have congestion.   GET HELP RIGHT AWAY IF YOU HAVE EMERGENCY WARNING SIGNS** FOR COVID-19. If you or someone is showing any of these signs seek emergency medical care immediately. Call 911 or proceed to your closest emergency facility if: . You develop worsening high fever. . Trouble breathing . Bluish lips or face . Persistent pain or pressure in the chest . New confusion . Inability to wake or stay awake . You cough up blood. . Your symptoms become more severe  **This list is not all possible symptoms. Contact your medical provider for any symptoms that are sever or concerning to you.  MAKE SURE YOU   Understand these instructions.  Will watch your condition.  Will get  help right away if you are not doing well or get worse.  Your e-visit answers were reviewed by a board certified advanced clinical practitioner to complete your personal care plan.  Depending on the condition, your plan could have included both over the counter or prescription medications.  If there is a problem please reply once you have received a response from your provider.  Your safety is important to [place].  If you have drug allergies check your prescription carefully.    You can use MyChart to ask questions about today's visit, request a non-urgent call back, or ask for a work or school excuse for 24 hours related to this e-Visit. If it has been greater than 24 hours you will need to follow up with your provider, or enter a new e-Visit to address those concerns. You will get an e-mail in the next two days asking about your experience.  I hope that your e-visit has been valuable and will speed your recovery. Thank you for using e-visits.   Greater than 5 minutes, yet less than 10 minutes of time have been spent researching, coordinating, and implementing care for this patient today.  Thank you for the details you included in the comment boxes. Those details are very helpful in determining the best course of treatment for you and help [place] to provide the best care.

## 2019-09-18 LAB — NOVEL CORONAVIRUS, NAA: SARS-CoV-2, NAA: NOT DETECTED

## 2019-09-28 HISTORY — PX: COLONOSCOPY WITH ESOPHAGOGASTRODUODENOSCOPY (EGD): SHX5779

## 2019-11-23 DIAGNOSIS — E039 Hypothyroidism, unspecified: Secondary | ICD-10-CM | POA: Diagnosis not present

## 2020-01-08 ENCOUNTER — Telehealth: Payer: Self-pay | Admitting: Family Medicine

## 2020-01-08 NOTE — Telephone Encounter (Signed)
error 

## 2020-01-10 ENCOUNTER — Encounter: Payer: Self-pay | Admitting: Internal Medicine

## 2020-01-10 ENCOUNTER — Telehealth (INDEPENDENT_AMBULATORY_CARE_PROVIDER_SITE_OTHER): Payer: BC Managed Care – PPO | Admitting: Internal Medicine

## 2020-01-10 DIAGNOSIS — G2581 Restless legs syndrome: Secondary | ICD-10-CM

## 2020-01-10 DIAGNOSIS — R000 Tachycardia, unspecified: Secondary | ICD-10-CM

## 2020-01-10 MED ORDER — METOPROLOL SUCCINATE ER 25 MG PO TB24: 12.5000 mg | ORAL_TABLET | Freq: Every day | ORAL | 0 refills | Status: DC

## 2020-01-10 NOTE — Progress Notes (Signed)
Virtual Visit via Telephone Note  I connected with Cassidy [name], on 01/10/2020 at 10:49 AM by telephone due to the COVID-19 pandemic and verified that I am speaking with the correct person using two identifiers.   Consent: I discussed the limitations, risks, security and privacy concerns of performing an evaluation and management service by telephone and the availability of in person appointments. I also discussed with the patient that there may be a patient responsible charge related to this service. The patient expressed understanding and agreed to proceed.   Location of Patient: Home   Location of Provider: Clinic    Persons participating in Telemedicine visit: Loucinda Worku [facility] Dr. Juleen China    History of Present Illness: Patient has a visit to f/u on chronic medical conditions.   Restless Leg Syndrome: Reports is much better. Is tolerable and able to go to sleep at night. Thinks this may be related to her weight loss--has lost 35 lbs in the past 6-8 months. She was prescribed Gabapentin for this and has not taken it for 3 weeks. Is sleeping well.   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 The medication was continued due to tachycardia. She thinks she's been on it for 10+ years. She was previously on 50 mg and it was lowered to 25 mg about 2-3 years ago due to hypotension. Reports her HR has improved. She is now noticing that her HR will get down to 60s in the evenings. Depending on what she is doing throughout the day, her HR will be between 80-90. She wears an Apple watch throughout the day. Watch has told her average resting HR is around 69.    Past Medical History:  Diagnosis Date  . Acquired hypothyroidism   . Antiphospholipid antibody syndrome ([place])   . Atrial fibrillation ([place])   . Autoimmune disorder ([place])   . GERD (gastroesophageal reflux disease)   . Mixed hyperlipidemia   . Morbid obesity ([place])    . Restless leg syndrome   . Tachycardia    Allergies  Allergen Reactions  . Statins   . Benadryl [Diphenhydramine] Other (See Comments)    "restless leg, so I need gabapentin with it"  . Ciprocinonide [Fluocinolone] Other (See Comments)    Muscle aches  . Ciprofloxacin     Other reaction(s): Other (See Comments)  . Ibuprofen Other (See Comments)  . Nsaids Hives  . Tolmetin Hives    Current Outpatient Medications on File Prior to Visit  Medication Sig Dispense Refill  . gabapentin (NEURONTIN) 300 MG capsule Take 1 capsule (300 mg total) by mouth at bedtime. 90 capsule 2  . levothyroxine (SYNTHROID) 175 MCG tablet TAKE ONE TABLET BY MOUTH DAILY    . metoprolol succinate (TOPROL-XL) 25 MG 24 hr tablet Take 1 tablet (25 mg total) by mouth daily. 90 tablet 2   No current facility-administered medications on file prior to visit.    Observations/Objective: NAD. Speaking clearly.  Work of breathing normal.  Alert and oriented. Mood appropriate.   Assessment and Plan: 1. Sinus tachycardia Given improvement in pulse, will decrease Metoprolol to 12.5 mg daily. Her tablets have the score in the middle so she is able to split in half. Will plan to eventually discontinue completely if pulse remains stable--will monitor over time.  - metoprolol succinate (TOPROL-XL) 25 MG 24 hr tablet; Take 0.5 tablets (12.5 mg total) by mouth daily.  Dispense: 90 tablet; Refill: 0  2. RLS (  restless legs syndrome) Improved. Will d/c Gabapentin for now.    Follow Up Instructions: Annual exam   I discussed the assessment and treatment plan with the patient. The patient was provided an opportunity to ask questions and all were answered. The patient agreed with the plan and demonstrated an understanding of the instructions.   The patient was advised to call back or seek an in-person evaluation if the symptoms worsen or if the condition fails to improve as anticipated.     I provided 12 minutes total  of non-face-to-face time during this encounter including median intraservice time, reviewing previous notes, investigations, ordering medications, medical decision making, coordinating care and patient verbalized understanding at the end of the visit.    Phill Myron, D.O. Primary Care at [facility]  01/10/2020, 10:49 AM

## 2020-01-10 NOTE — Patient Instructions (Incomplete)
Thank you for choosing Primary Care at [facility] to be your medical home!    Cassidy [name] was seen by Melina Schools, DO today.   Cassidy [name]'s primary care provider is Phill Myron, DO.   For the best care possible, you should try to see Phill Myron, DO whenever you come to the clinic.   We look forward to seeing you again soon!  If you have any questions about your visit today, please call us at [phone] or feel free to reach your primary care provider via [place].

## 2020-02-27 ENCOUNTER — Telehealth: Payer: Self-pay

## 2020-02-27 NOTE — Patient Instructions (Signed)

## 2020-02-27 NOTE — Telephone Encounter (Signed)
Called patient to do their pre-visit COVID screening.  Have you tested positive for COVID or are you currently waiting for COVID test results? no  Have you recently traveled internationally([place], [place], [place], [place], [place]) or within the [place] to a hotspot area([place], [place], [place], [place], [place])? no  Are you currently experiencing any of the following symptoms: fever, cough, SHOB, fatigue, body aches, loss of smell/taste, rash, diarrhea, vomiting, severe headaches, weakness, sore throat? no  Have you been in contact with anyone who has recently travelled? no  Have you been in contact with anyone who is experiencing any of the above symptoms or been diagnosed with COVID  or works in or has recently visited a SNF? no  Asked patient to come fasting for CPE labs.

## 2020-02-28 ENCOUNTER — Encounter: Payer: Self-pay | Admitting: Internal Medicine

## 2020-02-28 ENCOUNTER — Ambulatory Visit (INDEPENDENT_AMBULATORY_CARE_PROVIDER_SITE_OTHER): Payer: BC Managed Care – PPO | Admitting: Internal Medicine

## 2020-02-28 ENCOUNTER — Other Ambulatory Visit: Payer: Self-pay

## 2020-02-28 VITALS — BP 103/73 | HR 81 | Temp 97.5°F | Resp 17 | Ht 62.0 in | Wt 191.0 lb

## 2020-02-28 DIAGNOSIS — Z0000 Encounter for general adult medical examination without abnormal findings: Secondary | ICD-10-CM | POA: Diagnosis not present

## 2020-02-28 DIAGNOSIS — Z113 Encounter for screening for infections with a predominantly sexual mode of transmission: Secondary | ICD-10-CM

## 2020-02-28 DIAGNOSIS — R000 Tachycardia, unspecified: Secondary | ICD-10-CM | POA: Diagnosis not present

## 2020-02-28 DIAGNOSIS — Z1231 Encounter for screening mammogram for malignant neoplasm of breast: Secondary | ICD-10-CM

## 2020-02-28 DIAGNOSIS — R1013 Epigastric pain: Secondary | ICD-10-CM

## 2020-02-28 MED ORDER — PANTOPRAZOLE SODIUM 40 MG PO TBEC: 40.0000 mg | DELAYED_RELEASE_TABLET | Freq: Every day | ORAL | 1 refills | Status: DC

## 2020-02-28 NOTE — Progress Notes (Signed)
Subjective:    Cassidy [name] - 39 y.o. female MRN [id number]  Date of birth: 02/15/1981  HPI  Cassidy [name] is here for annual exam.  Does have concerns about burning epigastric pain. Recently returned from a trip to [place]. Almost went to hospital due to severity of pain. Had 5 stomach ulcers as a teen and this felt ery similar. Has improved with switching to a more bland diet. Requests acid blocking medication. Has been on Protonix and Omeprazole in the past. Both worked but prefers [employer].   Sinus Tachycardia: At last visit we reduced Metoprolol from 25 to 12.5 mg. Reports for 2-3 days had some wackiness with her HR but then it "smoothed out". Now her apple watch shows that she frequently has HRs in the 60-70s sometimes as low as 55 at nighttime.      Health Maintenance:  Health Maintenance Due  Topic Date Due  . HIV Screening  Never done  . PAP SMEAR-Modifier  09/28/2019    -  reports that she has been smoking e-cigarettes. She has been smoking about 1.00 pack per day. She has never used smokeless tobacco. - Review of Systems: Per HPI. - Past Medical History: Patient Active Problem List   Diagnosis Date Noted  . Sinus tachycardia 09/03/2019  . RLS (restless legs syndrome) 09/03/2019  . Acquired hypothyroidism 09/03/2019   - Medications: reviewed and updated   Objective:   Physical Exam BP 103/73   Pulse 81   Temp (!) 97.5 F (36.4 C) (Temporal)   Resp 17   Ht 5\' 2"  (1.575 m)   Wt 191 lb (86.6 kg)   LMP 02/17/2020 (Exact Date)   SpO2 97%   BMI 34.93 kg/m  Physical Exam  Constitutional: She is oriented to person, place, and time and well-developed, well-nourished, and in no distress.  HENT:  Head: Normocephalic and atraumatic.  Mouth/Throat: Oropharynx is clear and moist.  TMs normal bilaterally   Eyes: Pupils are equal, round, and reactive to light. Conjunctivae and EOM are normal.  Neck: No thyromegaly present.  Cardiovascular: Normal rate, regular rhythm, normal  heart sounds and intact distal pulses.  No murmur heard. Pulmonary/Chest: Effort normal and breath sounds normal. No respiratory distress. She has no wheezes.  Abdominal: Soft. Bowel sounds are normal. She exhibits no distension. There is no abdominal tenderness. There is no rebound and no guarding.  Musculoskeletal:        General: No deformity or edema. Normal range of motion.     Cervical back: Normal range of motion and neck supple.  Lymphadenopathy:    She has no cervical adenopathy.  Neurological: She is alert and oriented to person, place, and time. Gait normal.  Skin: Skin is warm and dry. No rash noted. She is not diaphoretic.  Psychiatric: Mood, affect and judgment normal.           Assessment & Plan:   1. Annual physical exam Deferred PAP today due to patient choice.  Counseled on 150 minutes of exercise per week, healthy eating (including decreased daily intake of saturated fats, cholesterol, added sugars, sodium), STI prevention, routine healthcare maintenance. - CBC with Differential - Comprehensive metabolic panel - Lipid Panel  2. Breast cancer screening by mammogram - MM Digital Screening; Future  3. Screening for STDs (sexually transmitted diseases) - HIV antibody (with reflex) - RPR  4. Sinus tachycardia HR stable today and remains stable to low at home per patient report. Discontinue Metoprolol and monitor. BP is also on the  softer side, but patient is asymptomatic.   5. Abdominal pain, epigastric GERD vs. PUD. Currently improving. Will trial continuous PPI and continue bland diet, avoidance of NSAIDs etc. If no improvement, will need GI referral.  - pantoprazole (PROTONIX) 40 MG tablet; Take 1 tablet (40 mg total) by mouth daily.  Dispense: 30 tablet; Refill: [place], D.O. 02/28/2020, 8:44 AM Primary Care at [facility]

## 2020-02-29 LAB — COMPREHENSIVE METABOLIC PANEL
ALT: 9 IU/L (ref 0–32)
AST: 11 IU/L (ref 0–40)
Albumin/Globulin Ratio: 1.4 (ref 1.2–2.2)
Albumin: 4.1 g/dL (ref 3.8–4.8)
Alkaline Phosphatase: 45 IU/L — ABNORMAL LOW (ref 48–121)
BUN/Creatinine Ratio: 16 (ref 9–23)
BUN: 15 mg/dL (ref 6–24)
Bilirubin Total: 1.1 mg/dL (ref 0.0–1.2)
CO2: 21 mmol/L (ref 20–29)
Calcium: 9 mg/dL (ref 8.7–10.2)
Chloride: 104 mmol/L (ref 96–106)
Creatinine, Ser: 0.91 mg/dL (ref 0.57–1.00)
GFR calc Af Amer: 91 mL/min/{1.73_m2} (ref >59)
GFR calc non Af Amer: 79 mL/min/{1.73_m2} (ref >59)
Globulin, Total: 2.9 g/dL (ref 1.5–4.5)
Glucose: 92 mg/dL (ref 65–99)
Potassium: 4.2 mmol/L (ref 3.5–5.2)
Sodium: 138 mmol/L (ref 134–144)
Total Protein: 7 g/dL (ref 6.0–8.5)

## 2020-02-29 LAB — CBC WITH DIFFERENTIAL/PLATELET
Basophils Absolute: 0.1 10*3/uL (ref 0.0–0.2)
Basos: 1 %
EOS (ABSOLUTE): 0.1 10*3/uL (ref 0.0–0.4)
Eos: 2 %
Hematocrit: 41.3 % (ref 34.0–46.6)
Hemoglobin: 13.5 g/dL (ref 11.1–15.9)
Immature Grans (Abs): 0 10*3/uL (ref 0.0–0.1)
Immature Granulocytes: 0 %
Lymphocytes Absolute: 1.8 10*3/uL (ref 0.7–3.1)
Lymphs: 25 %
MCH: 27.8 pg (ref 26.6–33.0)
MCHC: 32.7 g/dL (ref 31.5–35.7)
MCV: 85 fL (ref 79–97)
Monocytes Absolute: 0.5 10*3/uL (ref 0.1–0.9)
Monocytes: 7 %
Neutrophils Absolute: 4.6 10*3/uL (ref 1.4–7.0)
Neutrophils: 65 %
Platelets: 281 10*3/uL (ref 150–450)
RBC: 4.85 x10E6/uL (ref 3.77–5.28)
RDW: 13.8 % (ref 11.7–15.4)
WBC: 7 10*3/uL (ref 3.4–10.8)

## 2020-02-29 LAB — LIPID PANEL
Chol/HDL Ratio: 3.7 ratio (ref 0.0–4.4)
Cholesterol, Total: 214 mg/dL — ABNORMAL HIGH (ref 100–199)
HDL: 58 mg/dL (ref >39)
LDL Chol Calc (NIH): 139 mg/dL — ABNORMAL HIGH (ref 0–99)
Triglycerides: 96 mg/dL (ref 0–149)
VLDL Cholesterol Cal: 17 mg/dL (ref 5–40)

## 2020-02-29 LAB — HIV ANTIBODY (ROUTINE TESTING W REFLEX): HIV Screen 4th Generation wRfx: NONREACTIVE

## 2020-02-29 LAB — RPR: RPR Ser Ql: NONREACTIVE

## 2020-02-29 NOTE — Progress Notes (Signed)
Patient notified of results & recommendations. Expressed understanding.

## 2020-03-08 DIAGNOSIS — H524 Presbyopia: Secondary | ICD-10-CM | POA: Diagnosis not present

## 2020-03-17 ENCOUNTER — Ambulatory Visit
Admission: EM | Admit: 2020-03-17 | Discharge: 2020-03-17 | Disposition: A | Discharge status: Home / Self Care | Payer: BC Managed Care – PPO | Attending: Physician Assistant | Admitting: Physician Assistant

## 2020-03-17 ENCOUNTER — Other Ambulatory Visit: Payer: Self-pay

## 2020-03-17 DIAGNOSIS — R42 Dizziness and giddiness: Secondary | ICD-10-CM | POA: Diagnosis not present

## 2020-03-17 MED ORDER — MECLIZINE HCL 25 MG PO TABS
25.0000 mg | ORAL_TABLET | Freq: Three times a day (TID) | ORAL | 0 refills | Status: DC | PRN
Start: 2020-03-17 — End: 2020-04-29

## 2020-03-17 MED ORDER — AZELASTINE HCL 0.1 % NA SOLN
2.0000 | Freq: Two times a day (BID) | NASAL | 0 refills | Status: DC
Start: 2020-03-17 — End: 2020-04-29

## 2020-03-17 NOTE — Discharge Instructions (Signed)
No alarming signs on exam. Continue meclizine as needed. Azelastine for eustachian tube dysfunction that may be contributing to symptoms. Follow up with PCP if symptoms not improving. If symptoms worsening, falling, passing out, go to the ED for further evaluation needed.

## 2020-03-17 NOTE — ED Triage Notes (Signed)
Pt c/o dizziness since yesterday morning. Worse when tilting head or on movement. Hx of vertigo. States took meclizine yesterday with relief but makes her sleepy so couldn't take to day before work. States now having a thumping feeling in lt ear.

## 2020-03-17 NOTE — ED Provider Notes (Signed)
[facility]    CSN: [id number] Arrival date & time: 03/17/20  1304      History   Chief Complaint Chief Complaint  Patient presents with  . Dizziness    HPI Cassidy [name] is a 39 y.o. female.   39 year old female comes in for 2 day history of dizziness. Spinning sensation, worse with movement of the head. Denies head injury, loss of consciousness. Nausea without vomiting. Baseline nasal congestion/rhinorrhea in the morning due to allergies. Now with throbbing/thuming sensaion to the legt ear. Meclizine with some relief.      Past Medical History:  Diagnosis Date  . Acquired hypothyroidism   . Antiphospholipid antibody syndrome ([place])   . Atrial fibrillation ([place])   . Autoimmune disorder ([place])   . GERD (gastroesophageal reflux disease)   . Mixed hyperlipidemia   . Morbid obesity ([place])   . Restless leg syndrome   . Tachycardia     Patient Active Problem List   Diagnosis Date Noted  . Sinus tachycardia 09/03/2019  . RLS (restless legs syndrome) 09/03/2019  . Acquired hypothyroidism 09/03/2019    Past Surgical History:  Procedure Laterality Date  . TONSILLECTOMY      OB History   No obstetric history on file.     Home Medications    Prior to Admission medications   Medication Sig Start Date End Date Taking? Authorizing Provider  azelastine (ASTELIN) 0.1 % nasal spray Place 2 sprays into both nostrils 2 (two) times daily. 03/17/20   Ok Edwards, PA-C  levothyroxine (SYNTHROID) 175 MCG tablet TAKE ONE TABLET BY MOUTH DAILY 12/25/18   [provider]  pantoprazole (PROTONIX) 40 MG tablet Take 1 tablet (40 mg total) by mouth daily. 02/28/20   Nicolette Bang, DO    Family History Family History  Problem Relation Age of Onset  . Hyperlipidemia Mother   . Supraventricular tachycardia Mother   . Hyperlipidemia Father   . Healthy Brother    Social History Social History   Tobacco Use  . Smoking status: Current Some Day Smoker      Packs/day: 1.00    Types: E-cigarettes  . Smokeless tobacco: Never Used  . Tobacco comment: vape only  Vaping Use  . Vaping Use: Every day  Substance Use Topics  . Alcohol use: No  . Drug use: No    Allergies   Statins, Benadryl [diphenhydramine], Ciprocinonide [fluocinolone], Ciprofloxacin, Ibuprofen, Nsaids, and Tolmetin   Review of Systems Review of Systems  Reason unable to perform ROS: See HPI as above.     Physical Exam Triage Vital Signs ED Triage Vitals  Enc Vitals Group     BP 03/17/20 1312 129/83     Pulse Rate 03/17/20 1312 69     Resp 03/17/20 1312 18     Temp 03/17/20 1312 98.5 F (36.9 C)     Temp Source 03/17/20 1312 Oral     SpO2 03/17/20 1312 98 %     Weight --      Height --      Head Circumference --      Peak Flow --      Pain Score 03/17/20 1326 4     Pain Loc --      Pain Edu? --      Excl. in GC? --    Orthostatic VS for the past 24 hrs:  BP- Lying Pulse- Lying BP- Sitting Pulse- Sitting BP- Standing at 0 minutes Pulse- Standing at  0 minutes  03/17/20 1329 (!) 130/94 68 134/84 64 132/80 75    Updated Vital Signs BP 129/83 (BP Location: Left Arm)   Pulse 69   Temp 98.5 F (36.9 C) (Oral)   Resp 18   LMP 02/15/2020   SpO2 98%   Physical Exam Constitutional:      General: She is not in acute distress.    Appearance: Normal appearance. She is well-developed. She is not toxic-appearing or diaphoretic.  HENT:     Head: Normocephalic and atraumatic.     Right Ear: Ear canal and external ear normal. Tympanic membrane is not erythematous or bulging.     Left Ear: Ear canal and external ear normal. Tympanic membrane is not erythematous or bulging.     Ears:     Comments: Bilateral TM slightly opaque    Mouth/Throat:     Mouth: Mucous membranes are moist.     Pharynx: Oropharynx is clear. Uvula midline.  Eyes:     Extraocular Movements: Extraocular movements intact.     Right eye: No nystagmus.     Left eye: No nystagmus.      Conjunctiva/sclera: Conjunctivae normal.     Pupils: Pupils are equal, round, and reactive to light.  Pulmonary:     Effort: Pulmonary effort is normal. No respiratory distress.     Comments: Speaking in full sentences without difficulty Musculoskeletal:     Cervical back: Normal range of motion and neck supple.  Skin:    General: Skin is warm and dry.  Neurological:     Mental Status: She is alert and oriented to person, place, and time.     GCS: GCS eye subscore is 4. GCS verbal subscore is 5. GCS motor subscore is 6.     Comments: Cranial nerves II-XII grossly intact. Strength 5/5 bilaterally for upper and lower extremity. Sensation intact. Normal coordination with normal finger to nose, heel to shin. Negative pronator drift, romberg. Gait intact. Able to ambulate on own without difficulty.       UC Treatments / Results  Labs (all labs ordered are listed, but only abnormal results are displayed) Labs Reviewed - No data to display  EKG   Radiology No results found.  Procedures Procedures (including critical care time)  Medications Ordered in UC Medications - No data to display  Initial Impression / Assessment and Plan / UC Course  I have reviewed the triage vital signs and the nursing notes.  Pertinent labs & imaging results that were available during my care of the patient were reviewed by me and considered in my medical decision making (see chart for details).    No alarming signs on exam. Neurology exam grossly intact. To continue meclizine as needed. Azelastine for possible eustachian tube dysfunction that may be contributing to symptoms. Return precautions given.  Final Clinical Impressions(s) / UC Diagnoses   Final diagnoses:  Dizziness    ED Prescriptions    Medication Sig Dispense Auth. Provider   azelastine (ASTELIN) 0.1 % nasal spray Place 2 sprays into both nostrils 2 (two) times daily. 30 mL Ok Edwards, PA-C     PDMP not reviewed this encounter.     Ok Edwards, PA-C 03/17/20 [id number]

## 2020-03-21 ENCOUNTER — Other Ambulatory Visit: Payer: Self-pay

## 2020-03-21 ENCOUNTER — Ambulatory Visit
Admission: RE | Admit: 2020-03-21 | Discharge: 2020-03-21 | Disposition: A | Discharge status: Home / Self Care | Payer: BC Managed Care – PPO | Source: Ambulatory Visit | Attending: Internal Medicine | Admitting: Internal Medicine

## 2020-03-21 DIAGNOSIS — Z1231 Encounter for screening mammogram for malignant neoplasm of breast: Secondary | ICD-10-CM | POA: Diagnosis not present

## 2020-03-26 ENCOUNTER — Other Ambulatory Visit: Payer: Self-pay | Admitting: Internal Medicine

## 2020-03-26 DIAGNOSIS — E039 Hypothyroidism, unspecified: Secondary | ICD-10-CM | POA: Diagnosis not present

## 2020-03-26 DIAGNOSIS — R928 Other abnormal and inconclusive findings on diagnostic imaging of breast: Secondary | ICD-10-CM

## 2020-03-28 ENCOUNTER — Other Ambulatory Visit: Payer: Self-pay | Admitting: Internal Medicine

## 2020-03-28 ENCOUNTER — Other Ambulatory Visit: Payer: Self-pay

## 2020-03-28 ENCOUNTER — Ambulatory Visit
Admission: RE | Admit: 2020-03-28 | Discharge: 2020-03-28 | Disposition: A | Discharge status: Home / Self Care | Payer: BC Managed Care – PPO | Source: Ambulatory Visit | Attending: Internal Medicine | Admitting: Internal Medicine

## 2020-03-28 DIAGNOSIS — N63 Unspecified lump in unspecified breast: Secondary | ICD-10-CM

## 2020-03-28 DIAGNOSIS — N6312 Unspecified lump in the right breast, upper inner quadrant: Secondary | ICD-10-CM | POA: Diagnosis not present

## 2020-03-28 DIAGNOSIS — R928 Other abnormal and inconclusive findings on diagnostic imaging of breast: Secondary | ICD-10-CM

## 2020-04-01 NOTE — Progress Notes (Signed)
Patient notified of results & recommendations at her appointment on 03/28/2020. Follow up unilateral mammogram & R breast ultrasound have been scheduled for 09/30/2020

## 2020-04-22 ENCOUNTER — Emergency Department (HOSPITAL_BASED_OUTPATIENT_CLINIC_OR_DEPARTMENT_OTHER)
Admission: EM | Admit: 2020-04-22 | Discharge: 2020-04-22 | Disposition: A | Discharge status: Home / Self Care | Payer: BC Managed Care – PPO | Attending: Emergency Medicine | Admitting: Emergency Medicine

## 2020-04-22 ENCOUNTER — Emergency Department (HOSPITAL_BASED_OUTPATIENT_CLINIC_OR_DEPARTMENT_OTHER): Payer: BC Managed Care – PPO

## 2020-04-22 ENCOUNTER — Other Ambulatory Visit: Payer: Self-pay

## 2020-04-22 ENCOUNTER — Encounter (HOSPITAL_BASED_OUTPATIENT_CLINIC_OR_DEPARTMENT_OTHER): Payer: Self-pay | Admitting: Emergency Medicine

## 2020-04-22 DIAGNOSIS — I4891 Unspecified atrial fibrillation: Secondary | ICD-10-CM | POA: Diagnosis not present

## 2020-04-22 DIAGNOSIS — F17290 Nicotine dependence, other tobacco product, uncomplicated: Secondary | ICD-10-CM | POA: Diagnosis not present

## 2020-04-22 DIAGNOSIS — K2970 Gastritis, unspecified, without bleeding: Secondary | ICD-10-CM | POA: Diagnosis not present

## 2020-04-22 DIAGNOSIS — E039 Hypothyroidism, unspecified: Secondary | ICD-10-CM | POA: Insufficient documentation

## 2020-04-22 DIAGNOSIS — R1013 Epigastric pain: Secondary | ICD-10-CM

## 2020-04-22 DIAGNOSIS — Z79890 Hormone replacement therapy (postmenopausal): Secondary | ICD-10-CM | POA: Insufficient documentation

## 2020-04-22 DIAGNOSIS — R109 Unspecified abdominal pain: Secondary | ICD-10-CM | POA: Diagnosis not present

## 2020-04-22 LAB — COMPREHENSIVE METABOLIC PANEL
ALT: 13 U/L (ref 0–44)
AST: 16 U/L (ref 15–41)
Albumin: 4.4 g/dL (ref 3.5–5.0)
Alkaline Phosphatase: 36 U/L — ABNORMAL LOW (ref 38–126)
Anion gap: 11 (ref 5–15)
BUN: 11 mg/dL (ref 6–20)
CO2: 23 mmol/L (ref 22–32)
Calcium: 9 mg/dL (ref 8.9–10.3)
Chloride: 101 mmol/L (ref 98–111)
Creatinine, Ser: 0.92 mg/dL (ref 0.44–1.00)
GFR calc Af Amer: >60 mL/min (ref >60)
GFR calc non Af Amer: >60 mL/min (ref >60)
Glucose, Bld: 96 mg/dL (ref 70–99)
Potassium: 3.8 mmol/L (ref 3.5–5.1)
Sodium: 135 mmol/L (ref 135–145)
Total Bilirubin: 1 mg/dL (ref 0.3–1.2)
Total Protein: 8 g/dL (ref 6.5–8.1)

## 2020-04-22 LAB — LIPASE, BLOOD: Lipase: 23 U/L (ref 11–51)

## 2020-04-22 LAB — URINALYSIS, ROUTINE W REFLEX MICROSCOPIC
Bilirubin Urine: NEGATIVE
Glucose, UA: NEGATIVE mg/dL
Ketones, ur: NEGATIVE mg/dL
Leukocytes,Ua: NEGATIVE
Nitrite: NEGATIVE
Protein, ur: NEGATIVE mg/dL
Specific Gravity, Urine: 1.01 (ref 1.005–1.030)
pH: 5 (ref 5.0–8.0)

## 2020-04-22 LAB — CBC
HCT: 42.1 % (ref 36.0–46.0)
Hemoglobin: 13.6 g/dL (ref 12.0–15.0)
MCH: 28.3 pg (ref 26.0–34.0)
MCHC: 32.3 g/dL (ref 30.0–36.0)
MCV: 87.7 fL (ref 80.0–100.0)
Platelets: 272 10*3/uL (ref 150–400)
RBC: 4.8 MIL/uL (ref 3.87–5.11)
RDW: 13.8 % (ref 11.5–15.5)
WBC: 8.8 10*3/uL (ref 4.0–10.5)
nRBC: 0 % (ref 0.0–0.2)

## 2020-04-22 LAB — HCG, SERUM, QUALITATIVE: Preg, Serum: NEGATIVE

## 2020-04-22 LAB — URINALYSIS, MICROSCOPIC (REFLEX)

## 2020-04-22 LAB — PREGNANCY, URINE: Preg Test, Ur: NEGATIVE

## 2020-04-22 MED ORDER — SODIUM CHLORIDE 0.9 % IV BOLUS
1000.0000 mL | Freq: Once | INTRAVENOUS | Status: AC
  Administered 2020-04-22: 1000 mL via INTRAVENOUS

## 2020-04-22 MED ORDER — SUCRALFATE 1 G PO TABS
1.0000 g | ORAL_TABLET | Freq: Three times a day (TID) | ORAL | 1 refills | Status: DC
Start: 2020-04-22 — End: 2020-04-30

## 2020-04-22 MED ORDER — LIDOCAINE VISCOUS HCL 2 % MT SOLN
15.0000 mL | Freq: Once | OROMUCOSAL | Status: AC
  Administered 2020-04-22: 15 mL via ORAL
  Filled 2020-04-22: qty 15

## 2020-04-22 MED ORDER — IOHEXOL 300 MG/ML  SOLN
100.0000 mL | Freq: Once | INTRAMUSCULAR | Status: AC
  Administered 2020-04-22: 100 mL via INTRAVENOUS

## 2020-04-22 MED ORDER — ALUM & MAG HYDROXIDE-SIMETH 200-200-20 MG/5ML PO SUSP
30.0000 mL | Freq: Once | ORAL | Status: AC
  Administered 2020-04-22: 30 mL via ORAL
  Filled 2020-04-22: qty 30

## 2020-04-22 MED ORDER — TRAMADOL HCL 50 MG PO TABS: 50.0000 mg | ORAL_TABLET | Freq: Four times a day (QID) | ORAL | 0 refills | Status: DC | PRN

## 2020-04-22 NOTE — Discharge Instructions (Signed)
Increase Protonix to 40 mg twice daily.  Begin taking Carafate as prescribed today.  Take tramadol as prescribed as needed for pain.  Follow-up with gastroenterology if your symptoms or not improving in the next week.  The contact information for [facility] gastroenterology has been provided in this discharge summary for you to call and make these arrangements.

## 2020-04-22 NOTE — ED Triage Notes (Signed)
Pt c/o burning abdominal pain with diarrhea  Since 1430 today. Pt denies N/V

## 2020-04-22 NOTE — ED Provider Notes (Signed)
[facility] Provider Note   CSN: [id number] Arrival date & time: 04/22/20  1901     History Chief Complaint  Patient presents with  . Abdominal Pain    Cassidy [name] is a 39 y.o. female.  Patient is a 39 year old female with past medical history of atrial fibrillation, GERD, hyperlipidemia.  She presents today for evaluation of abdominal pain.  Patient describes pain to the epigastric region that she describes as a "burning".  This began earlier this afternoon after eating a ham sandwich.  Her pain is constant and worse when she moves or sits forward.  She denies any bowel or bladder complaints.  She denies any black or bloody stool.  She denies fevers or chills.  She does report a similar episode on Sunday that lasted for 5 minutes, then resolved.  This began after eating fried chicken.  The history is provided by the patient.  Abdominal Pain Pain location:  Epigastric Pain quality: burning   Pain radiates to:  Does not radiate Pain severity:  Moderate Onset quality:  Sudden Duration:  8 hours Timing:  Constant Progression:  Worsening Chronicity:  New Relieved by:  Nothing Worsened by:  Movement and palpation      Past Medical History:  Diagnosis Date  . Acquired hypothyroidism   . Antiphospholipid antibody syndrome ([place])   . Atrial fibrillation ([place])   . Autoimmune disorder ([place])   . GERD (gastroesophageal reflux disease)   . Mixed hyperlipidemia   . Morbid obesity ([place])   . Restless leg syndrome   . Tachycardia     Patient Active Problem List   Diagnosis Date Noted  . Sinus tachycardia 09/03/2019  . RLS (restless legs syndrome) 09/03/2019  . Acquired hypothyroidism 09/03/2019    Past Surgical History:  Procedure Laterality Date  . TONSILLECTOMY       OB History   No obstetric history on file.     Family History  Problem Relation Age of Onset  . Hyperlipidemia Mother   . Supraventricular tachycardia Mother   .  Hyperlipidemia Father   . Healthy Brother   . Breast cancer Maternal Aunt   . Breast cancer Maternal Grandmother     Social History   Tobacco Use  . Smoking status: Current Some Day Smoker    Packs/day: 1.00    Types: E-cigarettes  . Smokeless tobacco: Never Used  . Tobacco comment: vape only  Vaping Use  . Vaping Use: Every day  Substance Use Topics  . Alcohol use: Yes    Comment: social  . Drug use: No    Home Medications Prior to Admission medications   Medication Sig Start Date End Date Taking? Authorizing Provider  azelastine (ASTELIN) 0.1 % nasal spray Place 2 sprays into both nostrils 2 (two) times daily. 03/17/20   Ok Edwards, PA-C  levothyroxine (SYNTHROID) 175 MCG tablet TAKE ONE TABLET BY MOUTH DAILY 12/25/18   [provider]  meclizine (ANTIVERT) 25 MG tablet Take 1 tablet (25 mg total) by mouth 3 (three) times daily as needed for dizziness. 03/17/20   Tasia Catchings, Amy V, PA-C  pantoprazole (PROTONIX) 40 MG tablet Take 1 tablet (40 mg total) by mouth daily. 02/28/20   Nicolette Bang, DO    Allergies    Statins, Benadryl [diphenhydramine], Ciprocinonide [fluocinolone], Ciprofloxacin, Ibuprofen, Nsaids, and Tolmetin  Review of Systems   Review of Systems  Gastrointestinal: Positive for abdominal pain.  All other systems reviewed and are negative.   Physical Exam  Updated Vital Signs BP (!) 132/101 (BP Location: Left Arm)   Pulse 94   Temp 98.7 F (37.1 C) (Oral)   Resp 20   Ht 5\' 3"  (1.6 m)   Wt 86.6 kg   LMP 04/12/2020 (Approximate)   SpO2 100%   BMI 33.83 kg/m   Physical Exam Vitals and nursing note reviewed.  Constitutional:      General: She is not in acute distress.    Appearance: She is well-developed. She is not diaphoretic.  HENT:     Head: Normocephalic and atraumatic.  Cardiovascular:     Rate and Rhythm: Normal rate and regular rhythm.     Heart sounds: No murmur heard.  No friction rub. No gallop.   Pulmonary:     Effort:  Pulmonary effort is normal. No respiratory distress.     Breath sounds: Normal breath sounds. No wheezing.  Abdominal:     General: Bowel sounds are normal. There is no distension.     Palpations: Abdomen is soft.     Tenderness: There is abdominal tenderness in the epigastric area. There is no right CVA tenderness, left CVA tenderness, guarding or rebound.  Musculoskeletal:        General: Normal range of motion.     Cervical back: Normal range of motion and neck supple.  Skin:    General: Skin is warm and dry.  Neurological:     Mental Status: She is alert and oriented to person, place, and time.     ED Results / Procedures / Treatments   Labs (all labs ordered are listed, but only abnormal results are displayed) Labs Reviewed  URINALYSIS, ROUTINE W REFLEX MICROSCOPIC - Abnormal; Notable for the following components:      Result Value   Hgb urine dipstick SMALL (*)    All other components within normal limits  URINALYSIS, MICROSCOPIC (REFLEX) - Abnormal; Notable for the following components:   Bacteria, UA FEW (*)    All other components within normal limits  PREGNANCY, URINE  LIPASE, BLOOD  COMPREHENSIVE METABOLIC PANEL  CBC  HCG, SERUM, QUALITATIVE    EKG EKG Interpretation  Date/Time:  Tuesday April 22 2020 19:16:02 EDT Ventricular Rate:  80 PR Interval:  140 QRS Duration: 76 QT Interval:  360 QTC Calculation: 415 R Axis:   62 Text Interpretation: Sinus rhythm with marked sinus arrhythmia Nonspecific T wave abnormality Abnormal ECG No significant change since 02/10/2019 Confirmed by Veryl Speak [id number]) on 04/22/2020 7:32:45 PM   Radiology No results found.  Procedures Procedures (including critical care time)  Medications Ordered in ED Medications  sodium chloride 0.9 % bolus 1,000 mL (has no administration in time range)  alum & mag hydroxide-simeth (MAALOX/MYLANTA) 200-200-20 MG/5ML suspension 30 mL (has no administration in time range)    And    lidocaine (XYLOCAINE) 2 % viscous mouth solution 15 mL (has no administration in time range)    ED Course  I have reviewed the triage vital signs and the nursing notes.  Pertinent labs & imaging results that were available during my care of the patient were reviewed by me and considered in my medical decision making (see chart for details).    MDM Rules/Calculators/A&P  Patient presenting here with complaints of epigastric burning.  Her symptoms sound like gastritis/reflux.  Patient has been taking Protonix for the past month, however this does not seem to be helping.  Her work-up today shows no evidence for gallstones or other abnormality.  Lipase is normal and  LFTs are normal as well.  CT scan shows no acute process.  I will have her increase her Protonix to twice daily and add Carafate.  I will also prescribe tramadol which she can take as needed for pain.  If symptoms or not improving in the next few days, she can follow-up with her primary doctor.  Final Clinical Impression(s) / ED Diagnoses Final diagnoses:  None    Rx / DC Orders ED Discharge Orders    None       Veryl Speak, MD 04/22/20 2326

## 2020-04-29 ENCOUNTER — Telehealth: Payer: Self-pay

## 2020-04-29 NOTE — Telephone Encounter (Signed)
Called patient to do their pre-visit COVID screening.  Have you tested positive for COVID or are you currently waiting for COVID test results? no  Have you recently traveled internationally([place], [place], [place], [place], [place]) or within the [place] to a hotspot area([place], [place], [place], [place], [place])? no  Are you currently experiencing any of the following symptoms: fever, cough, SHOB, fatigue, body aches, loss of smell/taste, rash, diarrhea, vomiting, severe headaches, weakness, sore throat? no  Have you been in contact with anyone who has recently travelled? no  Have you been in contact with anyone who is experiencing any of the above symptoms or been diagnosed with [place]  or works in or has recently visited a SNF? No  Patient states that this visit is not for her pap smear. Visit changed to virtual.

## 2020-04-30 ENCOUNTER — Encounter: Payer: Self-pay | Admitting: Internal Medicine

## 2020-04-30 ENCOUNTER — Telehealth: Payer: Self-pay | Admitting: Internal Medicine

## 2020-04-30 ENCOUNTER — Telehealth (INDEPENDENT_AMBULATORY_CARE_PROVIDER_SITE_OTHER): Payer: BC Managed Care – PPO | Admitting: Internal Medicine

## 2020-04-30 DIAGNOSIS — R1013 Epigastric pain: Secondary | ICD-10-CM

## 2020-04-30 MED ORDER — SUCRALFATE 1 G PO TABS: 1.0000 g | ORAL_TABLET | Freq: Three times a day (TID) | ORAL | 1 refills | Status: DC

## 2020-04-30 MED ORDER — PANTOPRAZOLE SODIUM 40 MG PO TBEC: 40.0000 mg | DELAYED_RELEASE_TABLET | Freq: Every day | ORAL | 1 refills | Status: DC

## 2020-04-30 NOTE — Telephone Encounter (Signed)
Patient called in and requested to talk to pcp or nurse regarding her "leave from work". Patient stated that she returned to work today after her visit and had been to the bathroom 7xs due to her stomach being on fire. Patient stated that she is needing guidance on what would be best for her to do in regards to her situation. Please follow up at your earliest convenience.

## 2020-04-30 NOTE — Progress Notes (Signed)
Virtual Visit via Telephone Note  I connected with Cassidy [name], on 04/30/2020 at 8:37 AM by telephone due to the COVID-19 pandemic and verified that I am speaking with the correct person using two identifiers.   Consent: I discussed the limitations, risks, security and privacy concerns of performing an evaluation and management service by telephone and the availability of in person appointments. I also discussed with the patient that there may be a patient responsible charge related to this service. The patient expressed understanding and agreed to proceed.   Location of Patient: Home   Location of Provider: Clinic    Persons participating in Telemedicine visit: Avarey Yaeger [facility] Dr. Juleen China      History of Present Illness: Patient has a visit to f/u GERD. This provider had prescribed Protonix on 6/3. She was apparently doing well but had a flare up on 7/27, prompting her to go to the ED. She had unremarkable work up, including negative CT abdominal scan. She was instructed to increase Protonix to BID and Carafate was added. Feels like Carafate is working but still having burning 2-3 hours after eating. Pain is not severe like it was that the day she went to the ED. She drinks 1/2 cup coffee in AM. She only drinks water---no soda or alcohol. Trying to adhere to a bland diet now as she was previously eating more spicy foods about 2 months.    Past Medical History:  Diagnosis Date  . Acquired hypothyroidism   . Antiphospholipid antibody syndrome ([place])   . Atrial fibrillation ([place])   . Autoimmune disorder ([place])   . GERD (gastroesophageal reflux disease)   . Mixed hyperlipidemia   . Morbid obesity ([place])   . Restless leg syndrome   . Tachycardia    Allergies  Allergen Reactions  . Statins   . Benadryl [Diphenhydramine] Other (See Comments)    "restless leg, so I need gabapentin with it"  . Ciprocinonide [Fluocinolone] Other (See Comments)    Muscle aches  .  Ciprofloxacin     Other reaction(s): Other (See Comments)  . Ibuprofen Other (See Comments)  . Nsaids Hives  . Tolmetin Hives  . Aspirin Hives    Current Outpatient Medications on File Prior to Visit  Medication Sig Dispense Refill  . levothyroxine (SYNTHROID) 175 MCG tablet TAKE ONE TABLET BY MOUTH DAILY    . pantoprazole (PROTONIX) 40 MG tablet Take 1 tablet (40 mg total) by mouth daily. 30 tablet 1  . sucralfate (CARAFATE) 1 g tablet Take 1 tablet (1 g total) by mouth 4 (four) times daily -  with meals and at bedtime. 60 tablet 1  . traMADol (ULTRAM) 50 MG tablet Take 1 tablet (50 mg total) by mouth every 6 (six) hours as needed. 15 tablet 0   No current facility-administered medications on file prior to visit.    Observations/Objective: NAD. Speaking clearly.  Work of breathing normal.  Alert and oriented. Mood appropriate.   Assessment and Plan: 1. Abdominal pain, epigastric Concerning for PUD given uncontrolled symptoms despite chronic PPI use. Continue Protonix and Carafate. Discussed avoidance of trigger foods, although she is already doing a good job at adhering to a bland diet, and avoidance of NSAIDs. Will refer to GI for consideration of endoscopy, H. Pylori testing, any further work up.  - pantoprazole (PROTONIX) 40 MG tablet; Take 1 tablet (40 mg total) by mouth daily.  Dispense: 30 tablet; Refill: 1 - Ambulatory referral to Gastroenterology - sucralfate (CARAFATE) 1 g tablet; Take  1 tablet (1 g total) by mouth 4 (four) times daily -  with meals and at bedtime.  Dispense: 60 tablet; Refill: 1   Follow Up Instructions: PRN and for routine medical care    I discussed the assessment and treatment plan with the patient. The patient was provided an opportunity to ask questions and all were answered. The patient agreed with the plan and demonstrated an understanding of the instructions.   The patient was advised to call back or seek an in-person evaluation if the symptoms  worsen or if the condition fails to improve as anticipated.     I provided 20 minutes total of non-face-to-face time during this encounter including median intraservice time, reviewing previous notes, investigations, ordering medications, medical decision making, coordinating care and patient verbalized understanding at the end of the visit.    Phill Myron, D.O. Primary Care at [facility]  04/30/2020, 8:37 AM

## 2020-05-01 ENCOUNTER — Encounter: Payer: Self-pay | Admitting: Gastroenterology

## 2020-05-01 ENCOUNTER — Telehealth: Payer: Self-pay

## 2020-05-01 NOTE — Telephone Encounter (Signed)
Will await paperwork from patient's job. Referral to GI was placed yesterday & patient has already been scheduled.

## 2020-05-01 NOTE — Telephone Encounter (Signed)
Pt dropped off signed release auth to provide records for [place]. ADDITIONAL pt req return call stating had a flare up at work yesterday after appt with Juleen China & was unable to finish the day at work. Pt request advise and letter and urgent contact with GI specialist.

## 2020-05-01 NOTE — Telephone Encounter (Signed)
Sent patient a MyChart message to have her notify job to fax paperwork to our office for completion.

## 2020-05-05 DIAGNOSIS — Z01818 Encounter for other preprocedural examination: Secondary | ICD-10-CM | POA: Diagnosis not present

## 2020-05-05 DIAGNOSIS — R197 Diarrhea, unspecified: Secondary | ICD-10-CM | POA: Diagnosis not present

## 2020-05-13 DIAGNOSIS — Z1211 Encounter for screening for malignant neoplasm of colon: Secondary | ICD-10-CM | POA: Diagnosis not present

## 2020-05-13 DIAGNOSIS — K635 Polyp of colon: Secondary | ICD-10-CM | POA: Diagnosis not present

## 2020-05-13 DIAGNOSIS — R197 Diarrhea, unspecified: Secondary | ICD-10-CM | POA: Diagnosis not present

## 2020-05-13 LAB — HM COLONOSCOPY

## 2020-05-21 DIAGNOSIS — A048 Other specified bacterial intestinal infections: Secondary | ICD-10-CM | POA: Diagnosis not present

## 2020-05-21 DIAGNOSIS — K2270 Barrett's esophagus without dysplasia: Secondary | ICD-10-CM | POA: Diagnosis not present

## 2020-05-21 DIAGNOSIS — K900 Celiac disease: Secondary | ICD-10-CM | POA: Diagnosis not present

## 2020-05-21 DIAGNOSIS — K2970 Gastritis, unspecified, without bleeding: Secondary | ICD-10-CM | POA: Diagnosis not present

## 2020-05-23 DIAGNOSIS — K529 Noninfective gastroenteritis and colitis, unspecified: Secondary | ICD-10-CM | POA: Diagnosis not present

## 2020-05-23 DIAGNOSIS — K635 Polyp of colon: Secondary | ICD-10-CM | POA: Diagnosis not present

## 2020-06-05 ENCOUNTER — Ambulatory Visit: Payer: BC Managed Care – PPO | Admitting: Gastroenterology

## 2020-06-09 DIAGNOSIS — R197 Diarrhea, unspecified: Secondary | ICD-10-CM | POA: Diagnosis not present

## 2020-08-13 IMAGING — CT CT HEAD WITHOUT CONTRAST
3 series · 15 of 47 positions shown, 18 images · non-contrast
Comparison: 03/28/2015, 08/23/2012.

CLINICAL DATA: Two-day history of headache associated with
dizziness and lightheadedness. Current history of migraines.

EXAM:
CT HEAD WITHOUT CONTRAST
TECHNIQUE: Contiguous axial images were obtained from the base of the skull
through the vertex without intravenous contrast.

[Series 2: head wo · axial · 0.41mm/px · z∈[-169,-44]mm · 9 of 30 slices shown, 12 images]
[im 3/30  brain]
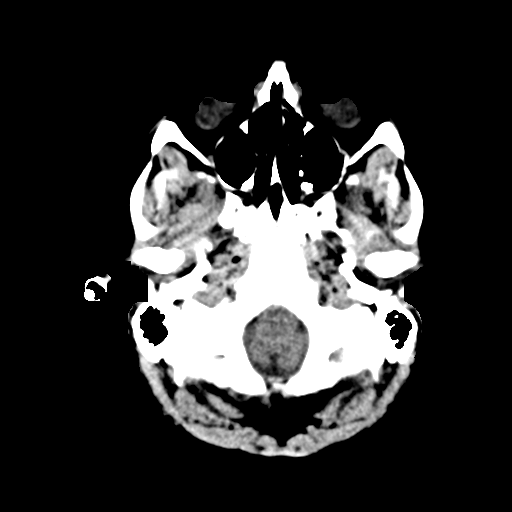
[im 3/30  bone]
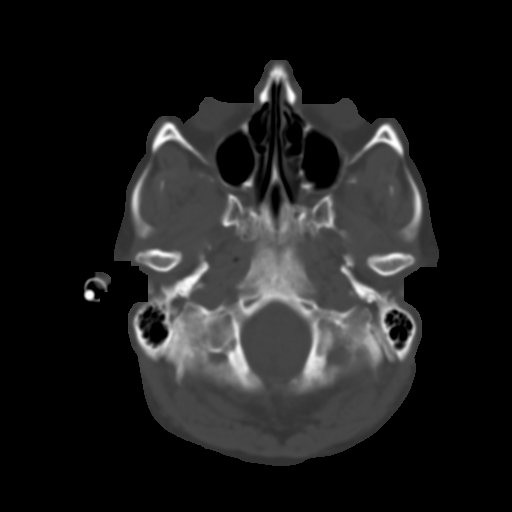
[im 6/30  brain]
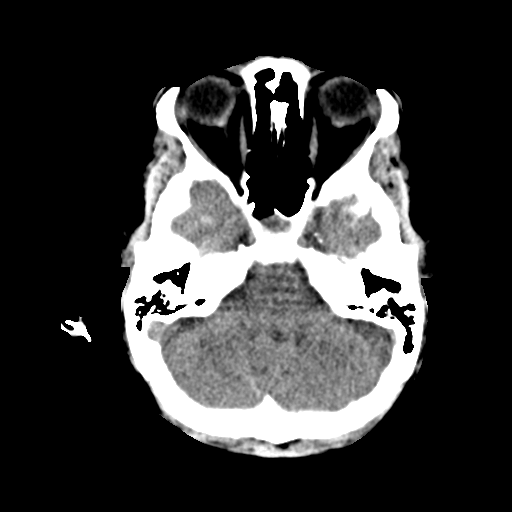
[im 9/30  brain]
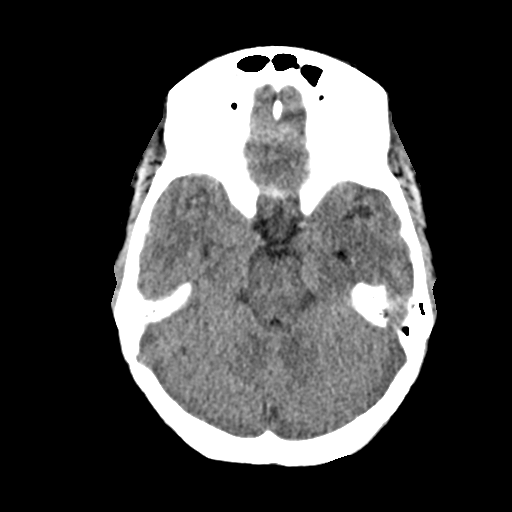
[im 12/30  brain]
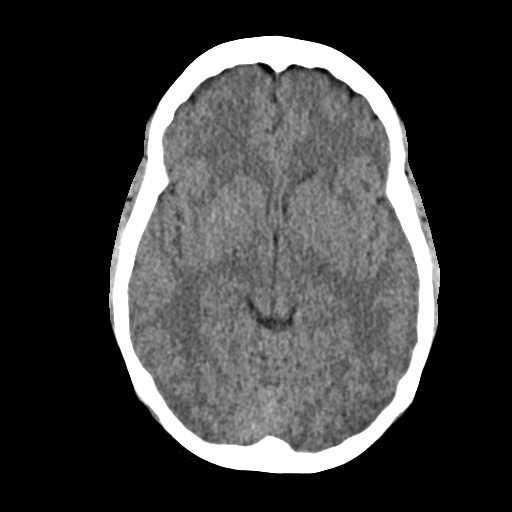
[im 16/30  brain]
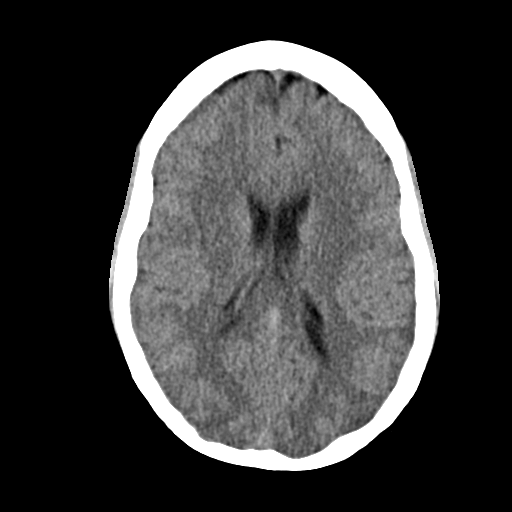
[im 16/30  bone]
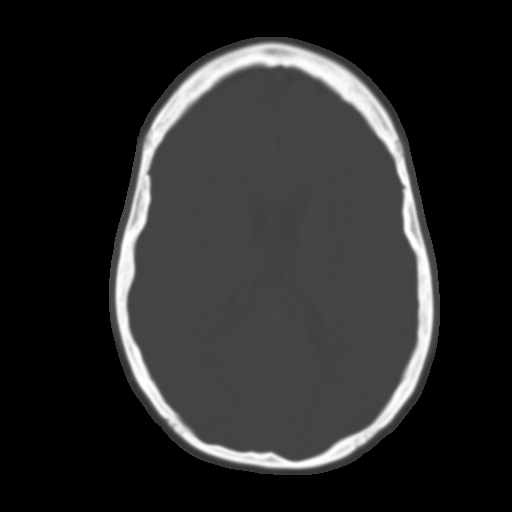
[im 19/30  brain]
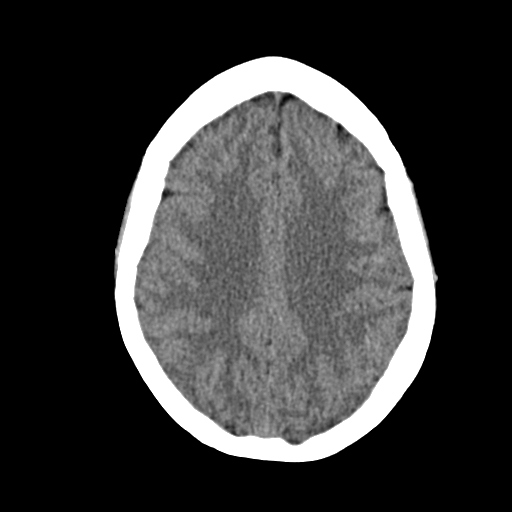
[im 22/30  brain]
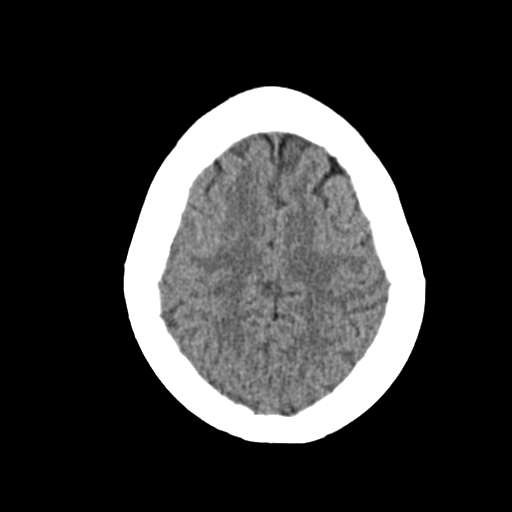
[im 25/30  brain]
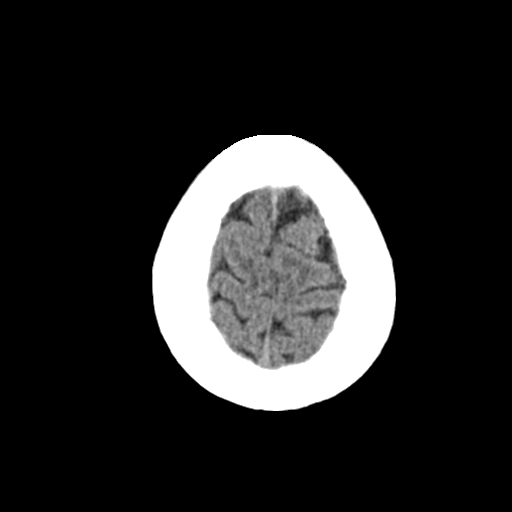
[im 28/30  brain]
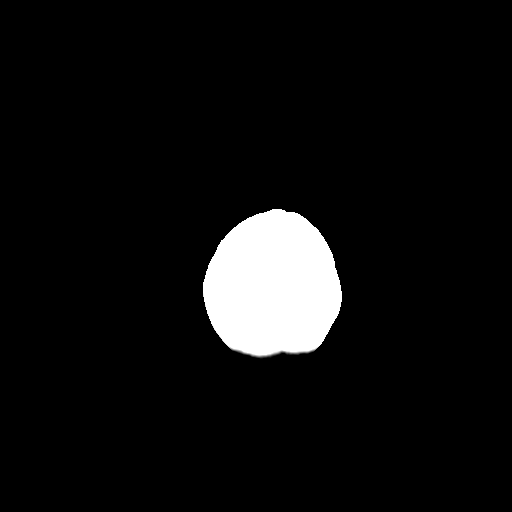
[im 28/30  bone]
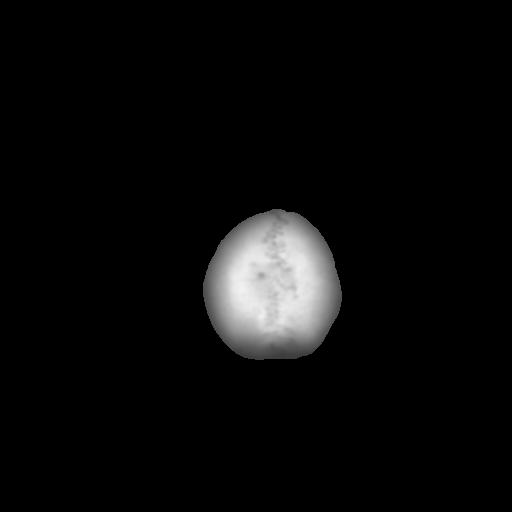

[Series 4: coronal soft · coronal · 0.29mm/px · 3 of 65 slices shown]
[im 22/65  brain]
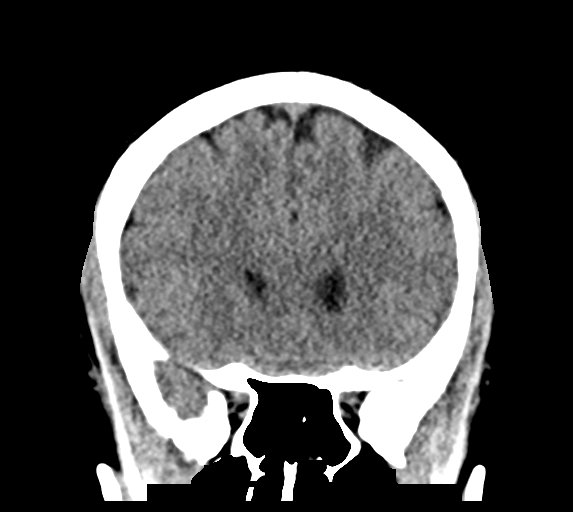
[im 29/65  brain]
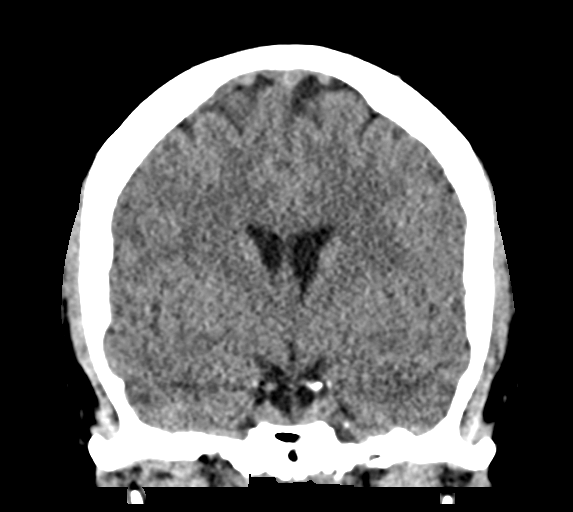
[im 36/65  brain]
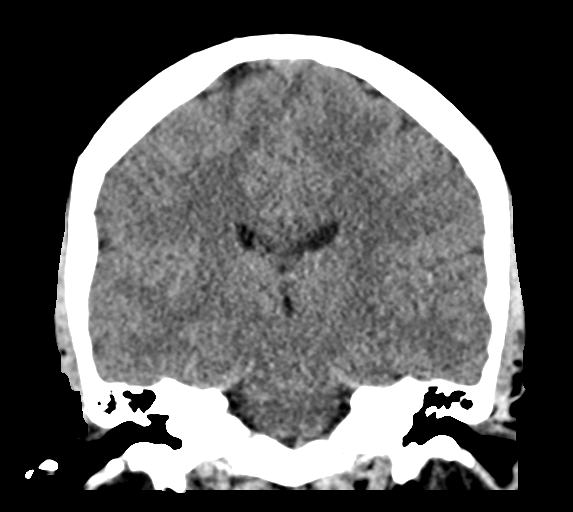

[Series 5: sag soft · sagittal · 0.30mm/px · 3 of 52 slices shown]
[im 18/52  brain]
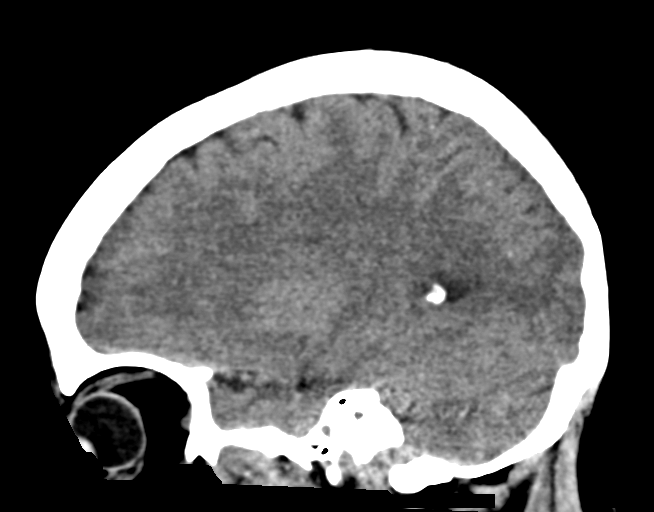
[im 26/52  brain]
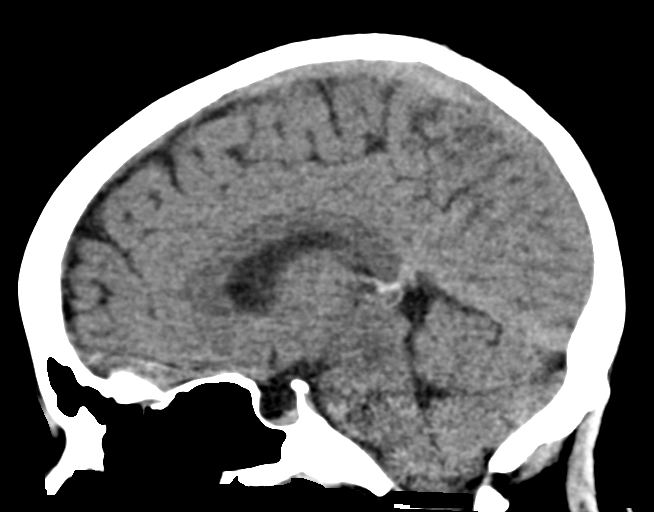
[im 35/52  brain]
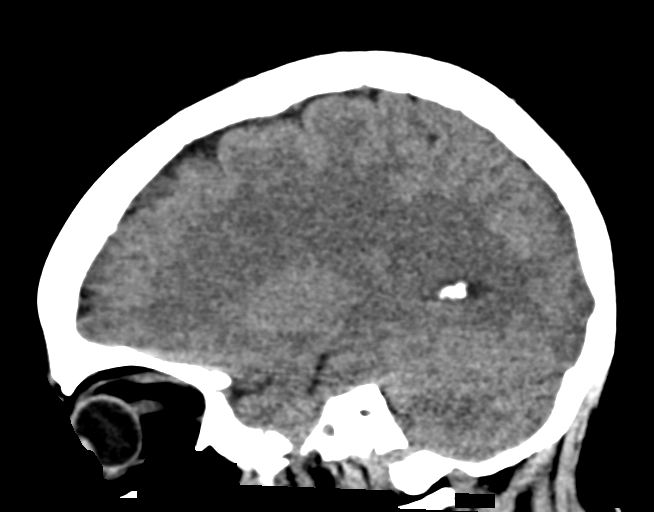

[15 of 47 positions shown; findings below may reference images not displayed]

FINDINGS: Brain: Ventricular system normal in size and appearance for age. No
mass lesion. No midline shift. No acute hemorrhage or hematoma. No
extra-axial fluid collections. No evidence of acute infarction. No
focal brain parenchymal abnormalities.

Vascular: No hyperdense vessel. No visible atherosclerosis.

Skull: No skull fracture or other focal osseous abnormality
involving the skull.

Sinuses/Orbits: Visualized paranasal sinuses, bilateral mastoid air
cells and bilateral middle ear cavities well-aerated. Visualized
orbits and globes normal in appearance.

Other: None.
IMPRESSION: Normal examination.

## 2020-09-30 ENCOUNTER — Ambulatory Visit
Admission: RE | Admit: 2020-09-30 | Discharge: 2020-09-30 | Disposition: A | Discharge status: Home / Self Care | Payer: BC Managed Care – PPO | Source: Ambulatory Visit | Attending: Internal Medicine | Admitting: Internal Medicine

## 2020-09-30 ENCOUNTER — Other Ambulatory Visit: Payer: Self-pay

## 2020-09-30 ENCOUNTER — Other Ambulatory Visit: Payer: Self-pay | Admitting: Internal Medicine

## 2020-09-30 DIAGNOSIS — N63 Unspecified lump in unspecified breast: Secondary | ICD-10-CM

## 2020-09-30 DIAGNOSIS — N6489 Other specified disorders of breast: Secondary | ICD-10-CM | POA: Diagnosis not present

## 2020-09-30 DIAGNOSIS — R922 Inconclusive mammogram: Secondary | ICD-10-CM | POA: Diagnosis not present

## 2020-10-09 ENCOUNTER — Other Ambulatory Visit: Payer: Self-pay

## 2020-10-09 ENCOUNTER — Emergency Department (HOSPITAL_BASED_OUTPATIENT_CLINIC_OR_DEPARTMENT_OTHER)
Admission: EM | Admit: 2020-10-09 | Discharge: 2020-10-09 | Disposition: A | Discharge status: Home / Self Care | Payer: BC Managed Care – PPO | Attending: Emergency Medicine | Admitting: Emergency Medicine

## 2020-10-09 ENCOUNTER — Encounter (HOSPITAL_BASED_OUTPATIENT_CLINIC_OR_DEPARTMENT_OTHER): Payer: Self-pay

## 2020-10-09 DIAGNOSIS — R002 Palpitations: Secondary | ICD-10-CM

## 2020-10-09 DIAGNOSIS — F17290 Nicotine dependence, other tobacco product, uncomplicated: Secondary | ICD-10-CM | POA: Insufficient documentation

## 2020-10-09 DIAGNOSIS — Z79899 Other long term (current) drug therapy: Secondary | ICD-10-CM | POA: Diagnosis not present

## 2020-10-09 DIAGNOSIS — E039 Hypothyroidism, unspecified: Secondary | ICD-10-CM | POA: Insufficient documentation

## 2020-10-09 DIAGNOSIS — I493 Ventricular premature depolarization: Secondary | ICD-10-CM | POA: Insufficient documentation

## 2020-10-09 DIAGNOSIS — I4891 Unspecified atrial fibrillation: Secondary | ICD-10-CM | POA: Insufficient documentation

## 2020-10-09 LAB — CBC WITH DIFFERENTIAL/PLATELET
Abs Immature Granulocytes: 0.02 10*3/uL (ref 0.00–0.07)
Basophils Absolute: 0 10*3/uL (ref 0.0–0.1)
Basophils Relative: 1 %
Eosinophils Absolute: 0.1 10*3/uL (ref 0.0–0.5)
Eosinophils Relative: 1 %
HCT: 40.5 % (ref 36.0–46.0)
Hemoglobin: 13.3 g/dL (ref 12.0–15.0)
Immature Granulocytes: 0 %
Lymphocytes Relative: 27 %
Lymphs Abs: 1.8 10*3/uL (ref 0.7–4.0)
MCH: 28.3 pg (ref 26.0–34.0)
MCHC: 32.8 g/dL (ref 30.0–36.0)
MCV: 86.2 fL (ref 80.0–100.0)
Monocytes Absolute: 0.5 10*3/uL (ref 0.1–1.0)
Monocytes Relative: 8 %
Neutro Abs: 4 10*3/uL (ref 1.7–7.7)
Neutrophils Relative %: 63 %
Platelets: 257 10*3/uL (ref 150–400)
RBC: 4.7 MIL/uL (ref 3.87–5.11)
RDW: 13.8 % (ref 11.5–15.5)
WBC: 6.4 10*3/uL (ref 4.0–10.5)
nRBC: 0 % (ref 0.0–0.2)

## 2020-10-09 LAB — BASIC METABOLIC PANEL
Anion gap: 10 (ref 5–15)
BUN: 10 mg/dL (ref 6–20)
CO2: 22 mmol/L (ref 22–32)
Calcium: 9.1 mg/dL (ref 8.9–10.3)
Chloride: 106 mmol/L (ref 98–111)
Creatinine, Ser: 0.75 mg/dL (ref 0.44–1.00)
GFR, Estimated: >60 mL/min (ref >60)
Glucose, Bld: 89 mg/dL (ref 70–99)
Potassium: 3.6 mmol/L (ref 3.5–5.1)
Sodium: 138 mmol/L (ref 135–145)

## 2020-10-09 LAB — TSH: TSH: 2.376 u[IU]/mL (ref 0.350–4.500)

## 2020-10-09 NOTE — ED Provider Notes (Signed)
[facility] EMERGENCY DEPARTMENT Provider Note  CSN: [id number] Arrival date & time: 10/09/20 2409    History Chief Complaint  Patient presents with  . Palpitations    HPI  Cassidy [name] is a 40 y.o. female with history of hypothyroidism on synthroid reports she has had palpitations since last night at work, described as a feeling of butterflies in her chest that makes her feel like she needs to clear her throat. She checked her HR at home and noted it to be irregular prompting her to come to the ED for evaluation. She has been on a stable dose of synthroid with normal TFT last year but notes she has lost some weight recently. She denies any CP, SOB, fever cough, N/V/D. She was previously on metoprolol for tachycardia but did not tolerate side effects well and was able to come off of it when she lost weight.   Past Medical History:  Diagnosis Date  . Acquired hypothyroidism   . Antiphospholipid antibody syndrome ([place])   . Atrial fibrillation ([place])   . Autoimmune disorder ([place])   . GERD (gastroesophageal reflux disease)   . Mixed hyperlipidemia   . Morbid obesity ([place])   . Restless leg syndrome   . Tachycardia     Past Surgical History:  Procedure Laterality Date  . TONSILLECTOMY      Family History  Problem Relation Age of Onset  . Hyperlipidemia Mother   . Supraventricular tachycardia Mother   . Hyperlipidemia Father   . Healthy Brother   . Breast cancer Maternal Aunt   . Breast cancer Maternal Grandmother     Social History   Tobacco Use  . Smoking status: Current Some Day Smoker    Packs/day: 1.00    Types: E-cigarettes  . Smokeless tobacco: Never Used  . Tobacco comment: vape only  Vaping Use  . Vaping Use: Every day  Substance Use Topics  . Alcohol use: Yes    Comment: social  . Drug use: No     Home Medications Prior to Admission medications   Medication Sig Start Date End Date Taking? Authorizing Provider  levothyroxine (SYNTHROID) 175  MCG tablet TAKE ONE TABLET BY MOUTH DAILY 12/25/18   [provider]  pantoprazole (PROTONIX) 40 MG tablet Take 1 tablet (40 mg total) by mouth daily. 04/30/20   Nicolette Bang, DO  sucralfate (CARAFATE) 1 g tablet Take 1 tablet (1 g total) by mouth 4 (four) times daily -  with meals and at bedtime. 04/30/20   Nicolette Bang, DO  traMADol (ULTRAM) 50 MG tablet Take 1 tablet (50 mg total) by mouth every 6 (six) hours as needed. 04/22/20   Veryl Speak, MD     Allergies    Statins, Benadryl [diphenhydramine], Ciprocinonide [fluocinolone], Ciprofloxacin, Ibuprofen, Nsaids, Tolmetin, and Aspirin   Review of Systems   Review of Systems A comprehensive review of systems was completed and negative except as noted in HPI.    Physical Exam BP 121/69 (BP Location: Left Arm)   Pulse 65   Temp 98.4 F (36.9 C) (Oral)   Resp 16   Ht 5\' 3"  (1.6 m)   Wt 88 kg   LMP 09/25/2020   SpO2 100%   BMI 34.37 kg/m   Physical Exam Vitals and nursing note reviewed.  Constitutional:      Appearance: Normal appearance.  HENT:     Head: Normocephalic and atraumatic.     Nose: Nose normal.     Mouth/Throat:  Mouth: Mucous membranes are moist.  Eyes:     Extraocular Movements: Extraocular movements intact.     Conjunctiva/sclera: Conjunctivae normal.  Cardiovascular:     Rate and Rhythm: Normal rate. Rhythm irregular.  Pulmonary:     Effort: Pulmonary effort is normal.     Breath sounds: Normal breath sounds.  Abdominal:     General: Abdomen is flat.     Palpations: Abdomen is soft.     Tenderness: There is no abdominal tenderness.  Musculoskeletal:        General: No swelling. Normal range of motion.     Cervical back: Neck supple.  Skin:    General: Skin is warm and dry.  Neurological:     General: No focal deficit present.     Mental Status: She is alert.  Psychiatric:        Mood and Affect: Mood normal.      ED Results / Procedures / Treatments    Labs (all labs ordered are listed, but only abnormal results are displayed) Labs Reviewed  BASIC METABOLIC PANEL  CBC WITH DIFFERENTIAL/PLATELET  TSH    EKG EKG Interpretation  Date/Time:  Thursday October 09 2020 08:26:23 EST Ventricular Rate:  85 PR Interval:    QRS Duration: 96 QT Interval:  367 QTC Calculation: 437 R Axis:   57 Text Interpretation: Sinus rhythm Normal ECG No significant change since last tracing Confirmed by Calvert Cantor [phone]) on 10/09/2020 9:17:51 AM    Radiology No results found.  Procedures Procedures  Medications Ordered in the ED Medications - No data to display   MDM Rules/Calculators/A&P MDM Patient's EKG is normal, but on cardiac monitor she is having occasional PVCs which correspond to her symptoms. Will check CBC and BMP. Send TSH but will not need to stay for that result. She was reassured that this is a common and benign problem. If symptoms become more severe or frequent she may need to go back on metoprolol, perhaps at a lower dose.  ED Course  I have reviewed the triage vital signs and the nursing notes.  Pertinent labs & imaging results that were available during my care of the patient were reviewed by me and considered in my medical decision making (see chart for details).  Clinical Course as of 10/09/20 1054  Thu Oct 09, 2020  1000 CBC is normal. BMP without any abnormalities. She was advised TSH would come back later today, if abnormal she can discuss with her endocrinologist. Otherwise will refer to Cardiology for further eval of symptomatic PVCs.  [CS]    Clinical Course User Index [CS] Truddie Hidden, MD    Final Clinical Impression(s) / ED Diagnoses Final diagnoses:  Palpitations  PVC's (premature ventricular contractions)    Rx / DC Orders ED Discharge Orders    None       Truddie Hidden, MD 10/09/20 1054

## 2020-10-09 NOTE — ED Triage Notes (Signed)
Pt arrives with reports of "feeling butterflies" in her chest. States that she then checked her HR and "it felt abnormal", reports feeling like she needs to clear her throat. Speaking in full sentences, reports being on Metoprolol in the past but not currently.

## 2020-10-14 DIAGNOSIS — I4891 Unspecified atrial fibrillation: Secondary | ICD-10-CM | POA: Insufficient documentation

## 2020-10-14 DIAGNOSIS — R000 Tachycardia, unspecified: Secondary | ICD-10-CM | POA: Insufficient documentation

## 2020-10-14 DIAGNOSIS — D6861 Antiphospholipid syndrome: Secondary | ICD-10-CM | POA: Insufficient documentation

## 2020-10-14 DIAGNOSIS — K219 Gastro-esophageal reflux disease without esophagitis: Secondary | ICD-10-CM | POA: Insufficient documentation

## 2020-10-14 DIAGNOSIS — D8989 Other specified disorders involving the immune mechanism, not elsewhere classified: Secondary | ICD-10-CM | POA: Insufficient documentation

## 2020-10-14 DIAGNOSIS — G2581 Restless legs syndrome: Secondary | ICD-10-CM | POA: Insufficient documentation

## 2020-10-14 DIAGNOSIS — E782 Mixed hyperlipidemia: Secondary | ICD-10-CM | POA: Insufficient documentation

## 2020-10-16 ENCOUNTER — Other Ambulatory Visit: Payer: Self-pay

## 2020-10-16 ENCOUNTER — Ambulatory Visit (INDEPENDENT_AMBULATORY_CARE_PROVIDER_SITE_OTHER): Payer: BC Managed Care – PPO

## 2020-10-16 ENCOUNTER — Ambulatory Visit: Payer: BC Managed Care – PPO | Admitting: Cardiology

## 2020-10-16 ENCOUNTER — Encounter: Payer: Self-pay | Admitting: Cardiology

## 2020-10-16 VITALS — BP 110/82 | HR 88 | Ht 63.0 in | Wt 200.0 lb

## 2020-10-16 DIAGNOSIS — D6861 Antiphospholipid syndrome: Secondary | ICD-10-CM

## 2020-10-16 DIAGNOSIS — R002 Palpitations: Secondary | ICD-10-CM

## 2020-10-16 DIAGNOSIS — E8881 Metabolic syndrome: Secondary | ICD-10-CM | POA: Diagnosis not present

## 2020-10-16 NOTE — Progress Notes (Signed)
Cardiology Office Note:    Date:  10/16/2020   ID:  Cristina Gong, DOB 05/29/1981, MRN [id number]  PCP:  Nicolette Bang, DO  Cardiologist:  Berniece Salines, DO  Electrophysiologist:  None   Referring MD: Caryl Never*   " I have had some fluttering in my chest "   History of Present Illness:    Cassidy [name] is a 40 y.o. female with a hx of antiphospholipid syndrome not on any anticoagulation and does not follow with hematology, atrial fibrillation is noted in her chart but the patient adamantly denies any history of A. fib and states her mother does have A. fib, obesity, PVCs, history of previous SVT and hyperlipidemia.  The patient tells me she was diagnosed with antiphospholipid syndrome after she had a stillbirth and she was tested.  At that time was recommended to take Coumadin but she declined given the restrictions on Coumadin.  For many years she has not seen hematology.  She does follow with endocrine giving her his hypothyroidism.  She has had to get her medication adjusted a couple times. To the patient tells me that initially when she was in her teens when she was diagnosed with SVT she was placed on metoprolol she has eventually been able to stop this medication. For recently she has had intermittent chest fluttering sensation.  Thankfully she has not passed out.  She is here with her significant other who confirms the patient medical history.  She notes that she has had 2 episodes where she thinks is related to spicy foods when she had a certain type of chips she felt significant palpitations which she had recently again and felt the same way.  No shortness of breath at this time.  She also mentioned that she has had some fatigue.  Past Medical History:  Diagnosis Date  . Acquired hypothyroidism   . Antiphospholipid antibody syndrome ([place])   . Atrial fibrillation ([place])   . Autoimmune disorder ([place])   . GERD (gastroesophageal reflux disease)   . Mixed  hyperlipidemia   . Morbid obesity ([place])   . Restless leg syndrome   . Tachycardia     Past Surgical History:  Procedure Laterality Date  . TONSILLECTOMY      Current Medications: Current Meds  Medication Sig  . levothyroxine (SYNTHROID) 175 MCG tablet TAKE ONE TABLET BY MOUTH DAILY  . sucralfate (CARAFATE) 1 g tablet Take 1 g by mouth 4 (four) times daily -  with meals and at bedtime. PRN     Allergies:   Statins, Benadryl [diphenhydramine], Ciprocinonide [fluocinolone], Ciprofloxacin, Ibuprofen, Nsaids, Tolmetin, and Aspirin   Social History   Socioeconomic History  . Marital status: Married    Spouse name: Not on file  . Number of children: Not on file  . Years of education: Not on file  . Highest education level: Not on file  Occupational History  . Not on file  Tobacco Use  . Smoking status: Current Some Day Smoker    Packs/day: 1.00    Types: E-cigarettes  . Smokeless tobacco: Never Used  . Tobacco comment: vape only  Vaping Use  . Vaping Use: Every day  Substance and Sexual Activity  . Alcohol use: Yes    Comment: social  . Drug use: No  . Sexual activity: Yes    Partners: Female    Birth control/protection: None  Other Topics Concern  . Not on file  Social History Narrative  . Not on file   Social  Determinants of Health   Financial Resource Strain: Not on file  Food Insecurity: Not on file  Transportation Needs: Not on file  Physical Activity: Not on file  Stress: Not on file  Social Connections: Not on file     Family History: The patient's family history includes Breast cancer in her maternal aunt and maternal grandmother; Healthy in her brother; Hyperlipidemia in her father and mother; Supraventricular tachycardia in her mother.  ROS:   Review of Systems  Constitution: Negative for decreased appetite, fever and weight gain.  HENT: Negative for congestion, ear discharge, hoarse voice and sore throat.   Eyes: Negative for discharge, redness,  vision loss in right eye and visual halos.  Cardiovascular: Negative for chest pain, dyspnea on exertion, leg swelling, orthopnea and palpitations.  Respiratory: Negative for cough, hemoptysis, shortness of breath and snoring.   Endocrine: Negative for heat intolerance and polyphagia.  Hematologic/Lymphatic: Negative for bleeding problem. Does not bruise/bleed easily.  Skin: Negative for flushing, nail changes, rash and suspicious lesions.  Musculoskeletal: Negative for arthritis, joint pain, muscle cramps, myalgias, neck pain and stiffness.  Gastrointestinal: Negative for abdominal pain, bowel incontinence, diarrhea and excessive appetite.  Genitourinary: Negative for decreased libido, genital sores and incomplete emptying.  Neurological: Negative for brief paralysis, focal weakness, headaches and loss of balance.  Psychiatric/Behavioral: Negative for altered mental status, depression and suicidal ideas.  Allergic/Immunologic: Negative for HIV exposure and persistent infections.    EKGs/Labs/Other Studies Reviewed:    The following studies were reviewed today:   EKG: Heart recent EKG in October 09, 2030 shows normal sinus rhythm.  Recent Labs: 04/22/2020: ALT 13 10/09/2020: BUN 10; Creatinine, Ser 0.75; Hemoglobin 13.3; Platelets 257; Potassium 3.6; Sodium 138; TSH 2.376  Recent Lipid Panel    Component Value Date/Time   CHOL 214 (H) 02/28/2020 0907   TRIG 96 02/28/2020 0907   HDL 58 02/28/2020 0907   CHOLHDL 3.7 02/28/2020 0907   LDLCALC 139 (H) 02/28/2020 0907    Physical Exam:    VS:  BP 110/82 (BP Location: Right Arm)   Pulse 88   Ht 5\' 3"  (1.6 m)   Wt 200 lb (90.7 kg)   LMP 09/25/2020   SpO2 99%   BMI 35.43 kg/m     Wt Readings from Last 3 Encounters:  10/16/20 200 lb (90.7 kg)  10/09/20 194 lb (88 kg)  04/22/20 191 lb (86.6 kg)     GEN: Well nourished, well developed in no acute distress HEENT: Normal NECK: No JVD; No carotid bruits LYMPHATICS: No  lymphadenopathy CARDIAC: S1S2 noted,RRR, no murmurs, rubs, gallops RESPIRATORY:  Clear to auscultation without rales, wheezing or rhonchi  ABDOMEN: Soft, non-tender, non-distended, +bowel sounds, no guarding. EXTREMITIES: No edema, No cyanosis, no clubbing MUSCULOSKELETAL:  No deformity  SKIN: Warm and dry NEUROLOGIC:  Alert and oriented x 3, non-focal PSYCHIATRIC:  Normal affect, good insight  ASSESSMENT:    1. Palpitations   2. Metabolic syndrome   3. Antiphospholipid syndrome (HCC)    PLAN:     1.  I would like to rule out a cardiovascular etiology of this palpitation, therefore at this time I would like to placed a zio patch for 14  days. In additon a transthoracic echocardiogram will be ordered to assess LV/RV function and any structural abnormalities. Once these testing have been performed amd reviewed further reccomendations will be made. For now, I do reccomend that the patient goes to the nearest ED if  symptoms recur.  2. I have  encouraged the patient to see hematology given her antiphospholipid syndrome I explained to her that she is at risk for strokes as well as significant DVTs and PEs.  She is agreeable to follow-up with hematology.  3.  Given her metabolic syndrome and fatigue I like to get a vitamin D level.  The patient is in agreement with the above plan. The patient left the office in stable condition.  The patient will follow up in   Medication Adjustments/Labs and Tests Ordered: Current medicines are reviewed at length with the patient today.  Concerns regarding medicines are outlined above.  Orders Placed This Encounter  Procedures  . Vitamin D 1,25 dihydroxy  . Ambulatory referral to Hematology  . LONG TERM MONITOR (3-14 DAYS)  . ECHOCARDIOGRAM COMPLETE   No orders of the defined types were placed in this encounter.   Patient Instructions   Medication Instructions:  Your physician recommends that you continue on your current medications as directed.  Please refer to the Current Medication list given to you today.  *If you need a refill on your cardiac medications before your next appointment, please call your pharmacy*   Lab Work: Your physician recommends that you return for lab work today: vitamin d  If you have labs (blood work) drawn today and your tests are completely normal, you will receive your results only by: Marland Kitchen MyChart Message (if you have MyChart) OR . A paper copy in the mail If you have any lab test that is abnormal or we need to change your treatment, we will call you to review the results.   Testing/Procedures: A zio monitor was ordered today. It will remain on for 14 days. You will then return monitor and event diary in provided box. It takes 1-2 weeks for report to be downloaded and returned to [place]. We will call you with the results. If monitor falls off or has orange flashing light, please call [employer] for further instructions.     Your physician has requested that you have an echocardiogram. Echocardiography is a painless test that uses sound waves to create images of your heart. It provides your doctor with information about the size and shape of your heart and how well your heart's chambers and valves are working. This procedure takes approximately one hour. There are no restrictions for this procedure.     Follow-Up: At [facility], you and your health needs are our priority.  As part of our continuing mission to provide you with exceptional heart care, we have created designated Provider Care Teams.  These Care Teams include your primary Cardiologist (physician) and Advanced Practice Providers (APPs -  Physician Assistants and Nurse Practitioners) who all work together to provide you with the care you need, when you need it.  We recommend signing up for the patient portal called "MyChart".  Sign up information is provided on this After Visit Summary.  MyChart is used to connect with patients for Virtual Visits  (Telemedicine).  Patients are able to view lab/test results, encounter notes, upcoming appointments, etc.  Non-urgent messages can be sent to your provider as well.   To learn more about what you can do with MyChart, go to [email or web address].    Your next appointment:   3 month(s)  The format for your next appointment:   In Person  Provider:   Berniece Salines, DO   Other Instructions   Echocardiogram An echocardiogram is a test that uses sound waves (ultrasound) to produce images of the heart. Images  from an echocardiogram can provide important information about:  Heart size and shape.  The size and thickness and movement of your heart's walls.  Heart muscle function and strength.  Heart valve function or if you have stenosis. Stenosis is when the heart valves are too narrow.  If blood is flowing backward through the heart valves (regurgitation).  A tumor or infectious growth around the heart valves.  Areas of heart muscle that are not working well because of poor blood flow or injury from a heart attack.  Aneurysm detection. An aneurysm is a weak or damaged part of an artery wall. The wall bulges out from the normal force of blood pumping through the body. Tell a health care provider about:  Any allergies you have.  All medicines you are taking, including vitamins, herbs, eye drops, creams, and over-the-counter medicines.  Any blood disorders you have.  Any surgeries you have had.  Any medical conditions you have.  Whether you are pregnant or may be pregnant. What are the risks? Generally, this is a safe test. However, problems may occur, including an allergic reaction to dye (contrast) that may be used during the test. What happens before the test? No specific preparation is needed. You may eat and drink normally. What happens during the test?  You will take off your clothes from the waist up and put on a hospital gown.  Electrodes or electrocardiogram  (ECG)patches may be placed on your chest. The electrodes or patches are then connected to a device that monitors your heart rate and rhythm.  You will lie down on a table for an ultrasound exam. A gel will be applied to your chest to help sound waves pass through your skin.  A handheld device, called a transducer, will be pressed against your chest and moved over your heart. The transducer produces sound waves that travel to your heart and bounce back (or "echo" back) to the transducer. These sound waves will be captured in real-time and changed into images of your heart that can be viewed on a video monitor. The images will be recorded on a computer and reviewed by your health care provider.  You may be asked to change positions or hold your breath for a short time. This makes it easier to get different views or better views of your heart.  In some cases, you may receive contrast through an IV in one of your veins. This can improve the quality of the pictures from your heart. The procedure may vary among health care providers and hospitals.   What can I expect after the test? You may return to your normal, everyday life, including diet, activities, and medicines, unless your health care provider tells you not to do that. Follow these instructions at home:  It is up to you to get the results of your test. Ask your health care provider, or the department that is doing the test, when your results will be ready.  Keep all follow-up visits. This is important. Summary  An echocardiogram is a test that uses sound waves (ultrasound) to produce images of the heart.  Images from an echocardiogram can provide important information about the size and shape of your heart, heart muscle function, heart valve function, and other possible heart problems.  You do not need to do anything to prepare before this test. You may eat and drink normally.  After the echocardiogram is completed, you may return to your  normal, everyday life, unless your health care provider  tells you not to do that. This information is not intended to replace advice given to you by your health care provider. Make sure you discuss any questions you have with your health care provider. Document Revised: 05/06/2020 Document Reviewed: 05/06/2020 Elsevier Patient Education  2021 [place].      Adopting a Healthy Lifestyle.  Know what a healthy weight is for you (roughly BMI <25) and aim to maintain this   Aim for 7+ servings of fruits and vegetables daily   65-80+ fluid ounces of water or unsweet tea for healthy kidneys   Limit to max 1 drink of alcohol per day; avoid smoking/tobacco   Limit animal fats in diet for cholesterol and heart health - choose grass fed whenever available   Avoid highly processed foods, and foods high in saturated/trans fats   Aim for low stress - take time to unwind and care for your mental health   Aim for 150 min of moderate intensity exercise weekly for heart health, and weights twice weekly for bone health   Aim for 7-9 hours of sleep daily   When it comes to diets, agreement about the perfect plan isnt easy to find, even among the experts. Experts at the [employer] developed an idea known as the Healthy Eating Plate. Just imagine a plate divided into logical, healthy portions.   The emphasis is on diet quality:   Load up on vegetables and fruits - one-half of your plate: Aim for color and variety, and remember that potatoes dont count.   Go for whole grains - one-quarter of your plate: Whole wheat, barley, wheat berries, quinoa, oats, brown rice, and foods made with them. If you want pasta, go with whole wheat pasta.   Protein power - one-quarter of your plate: Fish, chicken, beans, and nuts are all healthy, versatile protein sources. Limit red meat.   The diet, however, does go beyond the plate, offering a few other suggestions.   Use healthy plant  oils, such as olive, canola, soy, corn, sunflower and peanut. Check the labels, and avoid partially hydrogenated oil, which have unhealthy trans fats.   If youre thirsty, drink water. Coffee and tea are good in moderation, but skip sugary drinks and limit milk and dairy products to one or two daily servings.   The type of carbohydrate in the diet is more important than the amount. Some sources of carbohydrates, such as vegetables, fruits, whole grains, and beans-are healthier than others.   Finally, stay active  Signed, Berniece Salines, DO  10/16/2020 12:48 PM    [facility]

## 2020-10-16 NOTE — Patient Instructions (Signed)
Medication Instructions:  Your physician recommends that you continue on your current medications as directed. Please refer to the Current Medication list given to you today.  *If you need a refill on your cardiac medications before your next appointment, please call your pharmacy*   Lab Work: Your physician recommends that you return for lab work today: vitamin d  If you have labs (blood work) drawn today and your tests are completely normal, you will receive your results only by: Marland Kitchen MyChart Message (if you have MyChart) OR . A paper copy in the mail If you have any lab test that is abnormal or we need to change your treatment, we will call you to review the results.   Testing/Procedures: A zio monitor was ordered today. It will remain on for 14 days. You will then return monitor and event diary in provided box. It takes 1-2 weeks for report to be downloaded and returned to [place]. We will call you with the results. If monitor falls off or has orange flashing light, please call Zio for further instructions.     Your physician has requested that you have an echocardiogram. Echocardiography is a painless test that uses sound waves to create images of your heart. It provides your doctor with information about the size and shape of your heart and how well your heart's chambers and valves are working. This procedure takes approximately one hour. There are no restrictions for this procedure.     Follow-Up: At [facility], you and your health needs are our priority.  As part of our continuing mission to provide you with exceptional heart care, we have created designated Provider Care Teams.  These Care Teams include your primary Cardiologist (physician) and Advanced Practice Providers (APPs -  Physician Assistants and Nurse Practitioners) who all work together to provide you with the care you need, when you need it.  We recommend signing up for the patient portal called "MyChart".  Sign up  information is provided on this After Visit Summary.  MyChart is used to connect with patients for Virtual Visits (Telemedicine).  Patients are able to view lab/test results, encounter notes, upcoming appointments, etc.  Non-urgent messages can be sent to your provider as well.   To learn more about what you can do with MyChart, go to [email or web address].    Your next appointment:   3 month(s)  The format for your next appointment:   In Person  Provider:   Berniece Salines, DO   Other Instructions   Echocardiogram An echocardiogram is a test that uses sound waves (ultrasound) to produce images of the heart. Images from an echocardiogram can provide important information about:  Heart size and shape.  The size and thickness and movement of your heart's walls.  Heart muscle function and strength.  Heart valve function or if you have stenosis. Stenosis is when the heart valves are too narrow.  If blood is flowing backward through the heart valves (regurgitation).  A tumor or infectious growth around the heart valves.  Areas of heart muscle that are not working well because of poor blood flow or injury from a heart attack.  Aneurysm detection. An aneurysm is a weak or damaged part of an artery wall. The wall bulges out from the normal force of blood pumping through the body. Tell a health care provider about:  Any allergies you have.  All medicines you are taking, including vitamins, herbs, eye drops, creams, and over-the-counter medicines.  Any blood disorders you have.  Any surgeries you have had.  Any medical conditions you have.  Whether you are pregnant or may be pregnant. What are the risks? Generally, this is a safe test. However, problems may occur, including an allergic reaction to dye (contrast) that may be used during the test. What happens before the test? No specific preparation is needed. You may eat and drink normally. What happens during the test?  You  will take off your clothes from the waist up and put on a hospital gown.  Electrodes or electrocardiogram (ECG)patches may be placed on your chest. The electrodes or patches are then connected to a device that monitors your heart rate and rhythm.  You will lie down on a table for an ultrasound exam. A gel will be applied to your chest to help sound waves pass through your skin.  A handheld device, called a transducer, will be pressed against your chest and moved over your heart. The transducer produces sound waves that travel to your heart and bounce back (or "echo" back) to the transducer. These sound waves will be captured in real-time and changed into images of your heart that can be viewed on a video monitor. The images will be recorded on a computer and reviewed by your health care provider.  You may be asked to change positions or hold your breath for a short time. This makes it easier to get different views or better views of your heart.  In some cases, you may receive contrast through an IV in one of your veins. This can improve the quality of the pictures from your heart. The procedure may vary among health care providers and hospitals.   What can I expect after the test? You may return to your normal, everyday life, including diet, activities, and medicines, unless your health care provider tells you not to do that. Follow these instructions at home:  It is up to you to get the results of your test. Ask your health care provider, or the department that is doing the test, when your results will be ready.  Keep all follow-up visits. This is important. Summary  An echocardiogram is a test that uses sound waves (ultrasound) to produce images of the heart.  Images from an echocardiogram can provide important information about the size and shape of your heart, heart muscle function, heart valve function, and other possible heart problems.  You do not need to do anything to prepare before  this test. You may eat and drink normally.  After the echocardiogram is completed, you may return to your normal, everyday life, unless your health care provider tells you not to do that. This information is not intended to replace advice given to you by your health care provider. Make sure you discuss any questions you have with your health care provider. Document Revised: 05/06/2020 Document Reviewed: 05/06/2020 Elsevier Patient Education  2021 [employer].

## 2020-10-17 ENCOUNTER — Telehealth: Payer: Self-pay | Admitting: Family

## 2020-10-17 NOTE — Telephone Encounter (Signed)
Called and scheduled appointment w/ patient.  She is aware of location for visit and has My Chart Access as well.

## 2020-10-24 LAB — VITAMIN D 1,25 DIHYDROXY
Vitamin D 1, 25 (OH)2 Total: 41 pg/mL
Vitamin D2 1, 25 (OH)2: <10 pg/mL
Vitamin D3 1, 25 (OH)2: 39 pg/mL

## 2020-10-26 ENCOUNTER — Telehealth: Payer: Self-pay | Admitting: Physician Assistant

## 2020-10-26 NOTE — Telephone Encounter (Signed)
   The patient called the answering service after-hours today. Chart reviewed. Seen in ED recently with palpitations reported to be correlating with PVCs on cardiac monitor per EDP. EKGs generally reassuring. 14 day monitor is in progress (do not have report yet) and echocardiogram planned. Also h/o antiphospholipid syndrome and remote SVT. She has noticed over the last few days her PVCs have been more frequent. She reports when they were in the ED they were once every 13 beats or so but sometimes this weekend have been once every 6 beats. She denies any chest pain or dyspnea, just her stereotypical symptom she tends to have with PVCs with a "butterfly fluttering" every so often. She has tried to cut down on spicy food and caffeine but this has not made a difference. She's not been sleeping well either (not for any reason of pain or dyspnea), so unclear relationship to this provoking symptoms. Most recent BP 138/68 and HR 90. Pulse 100 after standing up. She has not had any tachycardia. No near-syncope or syncope. She was wondering if she needed to go to the ER for this or if she can try to manage at home until speaking with the office tomorrow. She has a previous rx on hand for scored Toprol 25mg  tablets which she reports are not expired. She was taken off this previously due to BP dipping down after she lost a lot of weight. Regarding med safety, she reports she is not pregnant as she is in same sex relationship. I reviewed with Dr. Gardiner Rhyme since last OV note indicated proceed to ER if symptoms recur. There is no acute new symptom change other than just the frequency. Given the ongoing Covid pandemic and ER exposure risk, we would like to try and manage her at home if possible with recommendation to restart trial of Toprol at 12.5mg  q12hr for now. Encouraged hydration, rest, and increase in potassium rich foods. I also relayed the vit D level result note from her recent OV per her request. Warning sx/ER  precautions reviewed.  Will route to Dr. Harriet Masson for further review and input on formal reorder of prescription metoprolol as well as possibly arranging labs in the office early this week to recheck lytes.  The patient verbalized understanding and gratitude.  Charlie Pitter, PA-C

## 2020-10-26 NOTE — Telephone Encounter (Deleted)
Duplicate, see other note in this chain.

## 2020-10-27 ENCOUNTER — Other Ambulatory Visit: Payer: Self-pay

## 2020-10-27 ENCOUNTER — Telehealth: Payer: Self-pay | Admitting: Cardiology

## 2020-10-27 DIAGNOSIS — I493 Ventricular premature depolarization: Secondary | ICD-10-CM

## 2020-10-27 MED ORDER — METOPROLOL SUCCINATE ER 25 MG PO TB24: 12.5000 mg | ORAL_TABLET | Freq: Every day | ORAL | 1 refills | Status: DC

## 2020-10-27 NOTE — Telephone Encounter (Signed)
sent 

## 2020-10-27 NOTE — Telephone Encounter (Signed)
Please assist, thanks

## 2020-10-27 NOTE — Telephone Encounter (Signed)
Sounds like a great plan.  We can start her Toprol-XL 12.5 mg daily and have her come in for some blood work.  BMP, mag, TSH.  Thank you

## 2020-10-27 NOTE — Telephone Encounter (Signed)
    Pt is returning call to get lab result, pt said if someone can call her back before 12 today. She works at night and she goes to bed at 12 nn.

## 2020-10-27 NOTE — Telephone Encounter (Signed)
Prescription send to [facility] and orders sent to lab.

## 2020-10-28 ENCOUNTER — Telehealth: Payer: Self-pay | Admitting: Cardiology

## 2020-10-28 NOTE — Telephone Encounter (Signed)
New message:     Patient calling stating that the medication metoprolol seems to not be working. Patient is not feeling well.

## 2020-10-28 NOTE — Telephone Encounter (Signed)
Patient's partner states the patient had a PVC after 19 beats and a skip after about 8 beats. They are concerned because the heart rate is irregular. Please advise.

## 2020-10-28 NOTE — Telephone Encounter (Signed)
Spoke with patient, she states she does not want to take the Metoprolol if it is going to mask her symptoms. Patient is informed the message will be relayed to Dr. Harriet Masson. Patient states the is significant cardiac history in her family. Her father has narrowed coronary arteries and has had triple bypass, her mother has a-fib and has had one ablation, her aunt has a-fib and has had 3 ablations.

## 2020-10-29 ENCOUNTER — Ambulatory Visit (HOSPITAL_COMMUNITY)
Admission: RE | Admit: 2020-10-29 | Discharge: 2020-10-29 | Disposition: A | Discharge status: Home / Self Care | Payer: BC Managed Care – PPO | Source: Ambulatory Visit | Attending: Cardiology | Admitting: Cardiology

## 2020-10-29 ENCOUNTER — Other Ambulatory Visit: Payer: Self-pay

## 2020-10-29 DIAGNOSIS — E785 Hyperlipidemia, unspecified: Secondary | ICD-10-CM | POA: Insufficient documentation

## 2020-10-29 DIAGNOSIS — I4891 Unspecified atrial fibrillation: Secondary | ICD-10-CM | POA: Diagnosis not present

## 2020-10-29 DIAGNOSIS — R002 Palpitations: Secondary | ICD-10-CM | POA: Diagnosis not present

## 2020-10-29 DIAGNOSIS — R011 Cardiac murmur, unspecified: Secondary | ICD-10-CM

## 2020-10-29 LAB — ECHOCARDIOGRAM COMPLETE
Area-P 1/2: 4.29 cm2
S' Lateral: 3 cm

## 2020-10-29 NOTE — Progress Notes (Signed)
Echocardiogram 2D Echocardiogram has been performed.  Oneal Deputy [place] Jeff 10/29/2020, 10:49 AM

## 2020-10-30 NOTE — Telephone Encounter (Signed)
Spoke with patient, she states she will just call if things get worse, she feels the medication that was given is starting to help the feeling some.

## 2020-11-07 ENCOUNTER — Ambulatory Visit (HOSPITAL_BASED_OUTPATIENT_CLINIC_OR_DEPARTMENT_OTHER): Payer: BC Managed Care – PPO

## 2020-11-11 DIAGNOSIS — I4891 Unspecified atrial fibrillation: Secondary | ICD-10-CM | POA: Insufficient documentation

## 2020-11-11 DIAGNOSIS — I471 Supraventricular tachycardia: Secondary | ICD-10-CM | POA: Insufficient documentation

## 2020-11-12 ENCOUNTER — Other Ambulatory Visit: Payer: Self-pay | Admitting: Family

## 2020-11-12 DIAGNOSIS — D6861 Antiphospholipid syndrome: Secondary | ICD-10-CM

## 2020-11-12 DIAGNOSIS — O039 Complete or unspecified spontaneous abortion without complication: Secondary | ICD-10-CM

## 2020-11-13 ENCOUNTER — Inpatient Hospital Stay: Payer: BC Managed Care – PPO | Attending: Hematology & Oncology

## 2020-11-13 ENCOUNTER — Encounter: Payer: Self-pay | Admitting: Cardiology

## 2020-11-13 ENCOUNTER — Encounter: Payer: Self-pay | Admitting: Family

## 2020-11-13 ENCOUNTER — Other Ambulatory Visit: Payer: BC Managed Care – PPO

## 2020-11-13 ENCOUNTER — Inpatient Hospital Stay (HOSPITAL_BASED_OUTPATIENT_CLINIC_OR_DEPARTMENT_OTHER): Payer: BC Managed Care – PPO | Admitting: Family

## 2020-11-13 ENCOUNTER — Other Ambulatory Visit: Payer: Self-pay

## 2020-11-13 ENCOUNTER — Ambulatory Visit: Payer: BC Managed Care – PPO | Admitting: Family

## 2020-11-13 ENCOUNTER — Ambulatory Visit: Payer: BC Managed Care – PPO | Admitting: Cardiology

## 2020-11-13 VITALS — HR 68 | Temp 97.8°F | Resp 18 | Ht 63.0 in | Wt 203.0 lb

## 2020-11-13 VITALS — BP 120/90 | HR 70 | Ht 63.0 in | Wt 203.0 lb

## 2020-11-13 DIAGNOSIS — Z79890 Hormone replacement therapy (postmenopausal): Secondary | ICD-10-CM | POA: Insufficient documentation

## 2020-11-13 DIAGNOSIS — F17290 Nicotine dependence, other tobacco product, uncomplicated: Secondary | ICD-10-CM | POA: Insufficient documentation

## 2020-11-13 DIAGNOSIS — Z886 Allergy status to analgesic agent status: Secondary | ICD-10-CM | POA: Insufficient documentation

## 2020-11-13 DIAGNOSIS — E039 Hypothyroidism, unspecified: Secondary | ICD-10-CM | POA: Insufficient documentation

## 2020-11-13 DIAGNOSIS — Z79899 Other long term (current) drug therapy: Secondary | ICD-10-CM

## 2020-11-13 DIAGNOSIS — K589 Irritable bowel syndrome without diarrhea: Secondary | ICD-10-CM | POA: Diagnosis not present

## 2020-11-13 DIAGNOSIS — F17210 Nicotine dependence, cigarettes, uncomplicated: Secondary | ICD-10-CM | POA: Insufficient documentation

## 2020-11-13 DIAGNOSIS — E8881 Metabolic syndrome: Secondary | ICD-10-CM

## 2020-11-13 DIAGNOSIS — Z8249 Family history of ischemic heart disease and other diseases of the circulatory system: Secondary | ICD-10-CM | POA: Diagnosis not present

## 2020-11-13 DIAGNOSIS — I471 Supraventricular tachycardia: Secondary | ICD-10-CM

## 2020-11-13 DIAGNOSIS — Z7289 Other problems related to lifestyle: Secondary | ICD-10-CM | POA: Insufficient documentation

## 2020-11-13 DIAGNOSIS — E669 Obesity, unspecified: Secondary | ICD-10-CM | POA: Diagnosis not present

## 2020-11-13 DIAGNOSIS — D6861 Antiphospholipid syndrome: Secondary | ICD-10-CM

## 2020-11-13 DIAGNOSIS — I491 Atrial premature depolarization: Secondary | ICD-10-CM

## 2020-11-13 DIAGNOSIS — I4719 Other supraventricular tachycardia: Secondary | ICD-10-CM

## 2020-11-13 DIAGNOSIS — O039 Complete or unspecified spontaneous abortion without complication: Secondary | ICD-10-CM

## 2020-11-13 DIAGNOSIS — Z803 Family history of malignant neoplasm of breast: Secondary | ICD-10-CM | POA: Insufficient documentation

## 2020-11-13 LAB — CBC WITH DIFFERENTIAL (CANCER CENTER ONLY)
Abs Immature Granulocytes: 0.02 10*3/uL (ref 0.00–0.07)
Basophils Absolute: 0 10*3/uL (ref 0.0–0.1)
Basophils Relative: 1 %
Eosinophils Absolute: 0.2 10*3/uL (ref 0.0–0.5)
Eosinophils Relative: 2 %
HCT: 39 % (ref 36.0–46.0)
Hemoglobin: 12.5 g/dL (ref 12.0–15.0)
Immature Granulocytes: 0 %
Lymphocytes Relative: 25 %
Lymphs Abs: 2 10*3/uL (ref 0.7–4.0)
MCH: 27.9 pg (ref 26.0–34.0)
MCHC: 32.1 g/dL (ref 30.0–36.0)
MCV: 87.1 fL (ref 80.0–100.0)
Monocytes Absolute: 0.5 10*3/uL (ref 0.1–1.0)
Monocytes Relative: 6 %
Neutro Abs: 5.2 10*3/uL (ref 1.7–7.7)
Neutrophils Relative %: 66 %
Platelet Count: 285 10*3/uL (ref 150–400)
RBC: 4.48 MIL/uL (ref 3.87–5.11)
RDW: 14.1 % (ref 11.5–15.5)
WBC Count: 7.9 10*3/uL (ref 4.0–10.5)
nRBC: 0 % (ref 0.0–0.2)

## 2020-11-13 LAB — CMP (CANCER CENTER ONLY)
ALT: 10 U/L (ref 0–44)
AST: 14 U/L — ABNORMAL LOW (ref 15–41)
Albumin: 4.3 g/dL (ref 3.5–5.0)
Alkaline Phosphatase: 33 U/L — ABNORMAL LOW (ref 38–126)
Anion gap: 7 (ref 5–15)
BUN: 14 mg/dL (ref 6–20)
CO2: 26 mmol/L (ref 22–32)
Calcium: 9.3 mg/dL (ref 8.9–10.3)
Chloride: 104 mmol/L (ref 98–111)
Creatinine: 0.84 mg/dL (ref 0.44–1.00)
GFR, Estimated: >60 mL/min (ref >60)
Glucose, Bld: 94 mg/dL (ref 70–99)
Potassium: 3.7 mmol/L (ref 3.5–5.1)
Sodium: 137 mmol/L (ref 135–145)
Total Bilirubin: 0.8 mg/dL (ref 0.3–1.2)
Total Protein: 7.3 g/dL (ref 6.5–8.1)

## 2020-11-13 LAB — ANTITHROMBIN III: AntiThromb III Func: 91 % (ref 75–120)

## 2020-11-13 LAB — D-DIMER, QUANTITATIVE: D-Dimer, Quant: 0.27 ug/mL-FEU (ref 0.00–0.50)

## 2020-11-13 LAB — LACTATE DEHYDROGENASE: LDH: 158 U/L (ref 98–192)

## 2020-11-13 LAB — SAVE SMEAR(SSMR), FOR PROVIDER SLIDE REVIEW

## 2020-11-13 LAB — MAGNESIUM: Magnesium: 2.1 mg/dL (ref 1.7–2.4)

## 2020-11-13 LAB — VITAMIN B12: Vitamin B-12: 295 pg/mL (ref 180–914)

## 2020-11-13 MED ORDER — DILTIAZEM HCL ER COATED BEADS 120 MG PO CP24: 120.0000 mg | ORAL_CAPSULE | Freq: Every day | ORAL | 3 refills | Status: DC

## 2020-11-13 NOTE — Progress Notes (Addendum)
Hematology/Oncology Consultation   Name: Cassidy [name]      MRN: [id number]    Location: Room/bed info not found  Date: 11/13/2020 Time:9:42 AM   REFERRING PHYSICIAN: Kardie Tobb, DO  REASON FOR CONSULT: Antiphospholipid antibody syndrome with history of miscarriage    DIAGNOSIS: Antiphospholipid antibody syndrome with history of miscarriage  HISTORY OF PRESENT ILLNESS: Ms. Sidell is a very pleasant 40 yo caucasian female with history of antiphospholipid antibody syndrome discovered after she experience a still birth 69 years ago. She states that the placenta was calcified and there was a clot in the umbilical cord. She was offered Coumadin at that time but did want to take. She has not seen Hematology in years.  She is allergic to aspirin.  She has not no other history of thrombotic event.  She is followed by cardiology for paroxysmal atrial tachycardia. She note palpitations often. She states that she has not had improvement on Toprol-XL and as of this morning will be discontinuing and starting Cardizem.  She does like an energy drink daily but otherwise avoids caffeine.  Mother and maternal aunt both have history of atrial fib.  Father has history of narrowed carotid artery with triple bypass. She states that both she and her twin brother have been checked and neither of them have the carotid artery narrowing.  She was previously a smoker but quit several years ago but still vapes and uses nicotine pouches while at work.  She has 1.5 oz of whiskey daily 6 days a week.  She does have acquired hypothyroidism and is on Synthroid daily.  No history of diabetes.  She states that her cycle is regular, every 25 days, with normal flow.  No fever, chills, n/v, cough, rash, SOB, chest pain, abdominal pain or changes in bowel or bladder habits.  She has history of IBS and varies between constipation and diarrhea. She states that she has history of hemorrhoids.  She has generalized joint aches and pains.  She also notes intermittent puffiness in her ankles as well as positional numbness and tingling in her arms. Once she moves the numbness and tingling dissipates.  No falls or syncope to report.  She has maintained a good appetite and is staying well hydrated. Her weight is stable.  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   ROS: All other 10 point review of systems is negative.   PAST MEDICAL HISTORY:   Past Medical History:  Diagnosis Date  . Acquired hypothyroidism   . Antiphospholipid antibody syndrome ([place])   . Atrial fibrillation ([place])   . Autoimmune disorder ([place])   . GERD (gastroesophageal reflux disease)   . Mixed hyperlipidemia   . Morbid obesity ([place])   . Restless leg syndrome   . Tachycardia     ALLERGIES: Allergies  Allergen Reactions  . Statins   . Benadryl [Diphenhydramine] Other (See Comments)    "restless leg, so I need gabapentin with it"  . Ciprocinonide [Fluocinolone] Other (See Comments)    Muscle aches  . Ciprofloxacin     Other reaction(s): Other (See Comments)  . Nsaids Hives  . Tolmetin Hives  . Aspirin Hives      MEDICATIONS:  Current Outpatient Medications on File Prior to Visit  Medication Sig Dispense Refill  . levothyroxine (SYNTHROID) 175 MCG tablet TAKE ONE TABLET BY MOUTH DAILY    . sucralfate (CARAFATE) 1 g tablet Take 1 g by mouth 4 (four) times daily -  with meals and at bedtime.  PRN (Patient not taking: Reported on 11/13/2020)     No current facility-administered medications on file prior to visit.     PAST SURGICAL HISTORY Past Surgical History:  Procedure Laterality Date  . TONSILLECTOMY      FAMILY HISTORY: Family History  Problem Relation Age of Onset  . Hyperlipidemia Mother   . Supraventricular tachycardia Mother   . Hyperlipidemia Father   . Healthy Brother   . Breast cancer Maternal Aunt   . Breast cancer Maternal Grandmother     SOCIAL HISTORY:  reports that she has been smoking  e-cigarettes. She has been smoking about 1.00 pack per day. She has never used smokeless tobacco. She reports current alcohol use. She reports that she does not use drugs.  PERFORMANCE STATUS: The patient's performance status is 0 - Asymptomatic  PHYSICAL EXAM: Most Recent Vital Signs: Height 5\' 3"  (1.6 m), weight 203 lb (92.1 kg), SpO2 100 %. Pulse 70   Temp 97.8 F (36.6 C) (Oral)   Resp 18   Ht 5\' 3"  (1.6 m)   Wt 203 lb (92.1 kg)   SpO2 100%   BMI 35.96 kg/m   General Appearance:    Alert, cooperative, no distress, appears stated age  Head:    Normocephalic, without obvious abnormality, atraumatic  Eyes:    PERRL, conjunctiva/corneas clear, EOM's intact, fundi    benign, both eyes        Throat:   Lips, mucosa, and tongue normal; teeth and gums normal  Neck:   Supple, symmetrical, trachea midline, no adenopathy;    thyroid:  no enlargement/tenderness/nodules; no carotid   bruit or JVD  Back:     Symmetric, no curvature, ROM normal, no CVA tenderness  Lungs:     Clear to auscultation bilaterally, respirations unlabored  Chest Wall:    No tenderness or deformity   Heart:    Regular rate and rhythm, S1 and S2 normal, no murmur, rub   or gallop     Abdomen:     Soft, non-tender, bowel sounds active all four quadrants,    no masses, no organomegaly        Extremities:   Extremities normal, atraumatic, no cyanosis or edema  Pulses:   2+ and symmetric all extremities  Skin:   Skin color, texture, turgor normal, no rashes or lesions  Lymph nodes:   Cervical, supraclavicular, and axillary nodes normal  Neurologic:   CNII-XII intact, normal strength, sensation and reflexes    throughout    LABORATORY DATA:  Results for orders placed or performed in visit on 11/13/20 (from the past 48 hour(s))  CBC with Differential (Cancer Center Only)     Status: None   Collection Time: 11/13/20  9:16 AM  Result Value Ref Range   WBC Count 7.9 4.0 - 10.5 K/uL   RBC 4.48 3.87 - 5.11 MIL/uL    Hemoglobin 12.5 12.0 - 15.0 g/dL   HCT 39.0 36.0 - 46.0 %   MCV 87.1 80.0 - 100.0 fL   MCH 27.9 26.0 - 34.0 pg   MCHC 32.1 30.0 - 36.0 g/dL   RDW 14.1 11.5 - 15.5 %   Platelet Count 285 150 - 400 K/uL   nRBC 0.0 0.0 - 0.2 %   Neutrophils Relative % 66 %   Neutro Abs 5.2 1.7 - 7.7 K/uL   Lymphocytes Relative 25 %   Lymphs Abs 2.0 0.7 - 4.0 K/uL   Monocytes Relative 6 %   Monocytes Absolute 0.5 0.1 -  1.0 K/uL   Eosinophils Relative 2 %   Eosinophils Absolute 0.2 0.0 - 0.5 K/uL   Basophils Relative 1 %   Basophils Absolute 0.0 0.0 - 0.1 K/uL   Immature Granulocytes 0 %   Abs Immature Granulocytes 0.02 0.00 - 0.07 K/uL    Comment: Performed at [facility] at [facility], [address] [place], [place] [address]  Save [facility])     Status: None   Collection Time: 11/13/20  9:16 AM  Result Value Ref Range   Smear Review SMEAR STAINED AND AVAILABLE FOR REVIEW     Comment: Performed at [facility] at [facility], [address] [place], [place] [address]      RADIOGRAPHY: No results found.     PATHOLOGY: None  ASSESSMENT/PLAN: Ms. Adami is a very pleasant 40 yo caucasian female with history of antiphospholipid antibody syndrome discovered after she experience a still birth 44 years ago. She refused Coumadin at that time. So far, she has not had another thrombotic event.  At this time we will just plan to follow-up as needed. No anticoagulation needed at this time per MD. Of note: patient is allergic to aspirin.  She was encouraged to contact our office prior to any surgery, if she develops a thrombus or if she decides to become pregnant again in the future.   All questions were answered and she is in agreement with the plan. She can contact our office with any questions or concerns. We can certainly see her future heme/onc issues.   The patient was discussed with Dr. Marin Olp and he is in agreement with the aforementioned.    Laverna Peace, NP

## 2020-11-13 NOTE — Progress Notes (Signed)
Cardiology Office Note:    Date:  11/13/2020   ID:  Cassidy [name], DOB December 22, 1980, MRN [id number]  PCP:  Nicolette Bang, DO  Cardiologist:  Berniece Salines, DO  Electrophysiologist:  None   Referring MD: Caryl Never*   Chief Complaint  Patient presents with  . Follow-up    History of Present Illness:    Cassidy [name] is a 40 y.o. female with a hx of antiphospholipid syndrome not on anticoagulation plans to see hematology today, PVCs, history of SVT and hyperlipidemia is here today for follow-up visit.  Of note the patient was diagnosed with antiphospholipid syndrome after she had a stillbirth and was tested.  She was recommended Coumadin at that time she declined and had not been on any anticoagulation and had not follow-up with hematology. She does have hypothyroidism and she sees endocrine for this.   I did see the patient on October 24, 2020 for the first time when she reported she had been experiencing significant palpitations and fluttering.  Thankfully she had not passed out but the fluttering was significant and she was concerned about this.  At the end of the visit I placed a monitor and recommend an echocardiogram.  She was able to get his testing done in the interim.  Due to monitor results in the interim I started the patient on metoprolol.  Today she tells me that she has been significantly tired on the metoprolol she feels that this is not helping her symptoms.  She notes that the palpitations are worse even on the metoprolol.   She does have a distant family history that she shared with me that her mother had A. fib ablation, her father had a bypass at age 60 and her father also does have narrowing of his carotid artery.  Past Medical History:  Diagnosis Date  . Acquired hypothyroidism   . Antiphospholipid antibody syndrome ([place])   . Atrial fibrillation ([place])   . Autoimmune disorder ([place])   . GERD (gastroesophageal reflux disease)   . Mixed  hyperlipidemia   . Morbid obesity ([place])   . Restless leg syndrome   . Tachycardia     Past Surgical History:  Procedure Laterality Date  . TONSILLECTOMY      Current Medications: Current Meds  Medication Sig  . diltiazem (CARDIZEM CD) 120 MG 24 hr capsule Take 1 capsule (120 mg total) by mouth daily.  Marland Kitchen levothyroxine (SYNTHROID) 175 MCG tablet TAKE ONE TABLET BY MOUTH DAILY  . sucralfate (CARAFATE) 1 g tablet Take 1 g by mouth 4 (four) times daily -  with meals and at bedtime. PRN  . [DISCONTINUED] metoprolol succinate (TOPROL XL) 25 MG 24 hr tablet Take 0.5 tablets (12.5 mg total) by mouth daily.     Allergies:   Statins, Benadryl [diphenhydramine], Ciprocinonide [fluocinolone], Ciprofloxacin, Ibuprofen, Nsaids, Tolmetin, and Aspirin   Social History   Socioeconomic History  . Marital status: Married    Spouse name: Not on file  . Number of children: Not on file  . Years of education: Not on file  . Highest education level: Not on file  Occupational History  . Not on file  Tobacco Use  . Smoking status: Current Some Day Smoker    Packs/day: 1.00    Types: E-cigarettes  . Smokeless tobacco: Never Used  . Tobacco comment: vape only  Vaping Use  . Vaping Use: Every day  Substance and Sexual Activity  . Alcohol use: Yes    Comment: social  .  Drug use: No  . Sexual activity: Yes    Partners: Female    Birth control/protection: None  Other Topics Concern  . Not on file  Social History Narrative  . Not on file   Social Determinants of Health   Financial Resource Strain: Not on file  Food Insecurity: Not on file  Transportation Needs: Not on file  Physical Activity: Not on file  Stress: Not on file  Social Connections: Not on file     Family History: The patient's family history includes Breast cancer in her maternal aunt and maternal grandmother; Healthy in her brother; Hyperlipidemia in her father and mother; Supraventricular tachycardia in her  mother.  ROS:   Review of Systems  Constitution: Negative for decreased appetite, fever and weight gain.  HENT: Negative for congestion, ear discharge, hoarse voice and sore throat.   Eyes: Negative for discharge, redness, vision loss in right eye and visual halos.  Cardiovascular: Negative for chest pain, dyspnea on exertion, leg swelling, orthopnea and palpitations.  Respiratory: Negative for cough, hemoptysis, shortness of breath and snoring.   Endocrine: Negative for heat intolerance and polyphagia.  Hematologic/Lymphatic: Negative for bleeding problem. Does not bruise/bleed easily.  Skin: Negative for flushing, nail changes, rash and suspicious lesions.  Musculoskeletal: Negative for arthritis, joint pain, muscle cramps, myalgias, neck pain and stiffness.  Gastrointestinal: Negative for abdominal pain, bowel incontinence, diarrhea and excessive appetite.  Genitourinary: Negative for decreased libido, genital sores and incomplete emptying.  Neurological: Negative for brief paralysis, focal weakness, headaches and loss of balance.  Psychiatric/Behavioral: Negative for altered mental status, depression and suicidal ideas.  Allergic/Immunologic: Negative for HIV exposure and persistent infections.    EKGs/Labs/Other Studies Reviewed:    The following studies were reviewed today:   EKG: None today  Transthoracic echocardiogram IMPRESSIONS    1. Left ventricular ejection fraction, by estimation, is 60 to 65%. The  left ventricle has normal function. The left ventricle has no regional  wall motion abnormalities. Left ventricular diastolic parameters were  normal.  2. Right ventricular systolic function is normal. The right ventricular  size is normal. There is normal pulmonary artery systolic pressure. The  estimated right ventricular systolic pressure is 43.3 mmHg.  3. The mitral valve is grossly normal. Trivial mitral valve  regurgitation. No evidence of mitral stenosis.   4. The aortic valve is tricuspid. Aortic valve regurgitation is not  visualized. No aortic stenosis is present.  5. The inferior vena cava is normal in size with greater than 50%  respiratory variability, suggesting right atrial pressure of 3 mmHg.    ZIO monitor The patient wore the monitor for 12 days 22 hours hours starting October 16, 2020. Indication: Palpitations  The minimum heart rate was 43 bpm, maximum heart rate was 169 bpm, and average heart rate was 81 bpm. Predominant underlying rhythm was Sinus Rhythm.   54 Supraventricular Tachycardia runs occurred, the run with the fastest interval lasting 8 beats with a maximum rate of 169 bpm (average 54 bpm); the run with the fastest interval was also the longest.  Premature atrial complexes were occasional (4.0%,57584). Premature Ventricular complexes were rare less than 1%.  No ventricular tachycardia, no pauses, No AV block and no atrial fibrillation present. 97 patient triggered events: One associated with supraventricular tachycardia and the remaining associated with premature atrial complexes sinus rhythm.  Conclusion: This study is remarkable for the following:  1. Symptomatic occasional premature atrial complex.                                  2. Supraventricular tachycardia which is likely atrial tachycardia with variable    Recent Labs: 04/22/2020: ALT 13 10/09/2020: BUN 10; Creatinine, Ser 0.75; Hemoglobin 13.3; Platelets 257; Potassium 3.6; Sodium 138; TSH 2.376  Recent Lipid Panel    Component Value Date/Time   CHOL 214 (H) 02/28/2020 0907   TRIG 96 02/28/2020 0907   HDL 58 02/28/2020 0907   CHOLHDL 3.7 02/28/2020 0907   LDLCALC 139 (H) 02/28/2020 0907    Physical Exam:    VS:  BP 120/90 (BP Location: Right Arm, Patient Position: Sitting, Cuff Size: Normal)   Pulse 70   Ht 5\' 3"  (1.6 m)   Wt 203 lb (92.1 kg)   SpO2 99%   BMI 35.96 kg/m     Wt Readings from Last 3  Encounters:  11/13/20 203 lb (92.1 kg)  10/16/20 200 lb (90.7 kg)  10/09/20 194 lb (88 kg)     GEN: Well nourished, well developed in no acute distress HEENT: Normal NECK: No JVD; No carotid bruits LYMPHATICS: No lymphadenopathy CARDIAC: S1S2 noted,RRR, no murmurs, rubs, gallops RESPIRATORY:  Clear to auscultation without rales, wheezing or rhonchi  ABDOMEN: Soft, non-tender, non-distended, +bowel sounds, no guarding. EXTREMITIES: No edema, No cyanosis, no clubbing MUSCULOSKELETAL:  No deformity  SKIN: Warm and dry NEUROLOGIC:  Alert and oriented x 3, non-focal PSYCHIATRIC:  Normal affect, good insight  ASSESSMENT:    1. PAT (paroxysmal atrial tachycardia) ([place])   2. PAC (premature atrial contraction)   3. Metabolic syndrome   4. Obesity (BMI 30-39.9)   5. Antiphospholipid antibody syndrome (H[facility])    PLAN:     Her monitor did show supraventricular tachycardia which is likely atrial tachycardia however there are some beats that may be AV nodal reentry tachycardia.  What I would like to do today is stop the Toprol since it is making the patient really tired.  Like to start the patient on Cardizem 120 mg daily.  If this does not help with symptom the plan will be to add a low-dose of antiarrhythmic like flecainide to her Cardizem.  Explained this to the patient as well.   In the meantime she has had some numbness and tingling in her face I like to check her B12 level and repeat BMP and mag.  She is going to establish care today with a hematologist for antiphospholipid syndrome.  The patient understands the need to lose weight with diet and exercise. We have discussed specific strategies for this.  The patient is in agreement with the above plan. The patient left the office in stable condition.  The patient will follow up in 4 weeks due to medication change.   Medication Adjustments/Labs and Tests Ordered: Current medicines are reviewed at length with the patient today.   Concerns regarding medicines are outlined above.  Orders Placed This Encounter  Procedures  . Basic metabolic panel  . Magnesium  . Vitamin B12   Meds ordered this encounter  Medications  . diltiazem (CARDIZEM CD) 120 MG 24 hr capsule    Sig: Take 1 capsule (120 mg total) by mouth daily.    Dispense:  90 capsule    Refill:  3    Patient Instructions  Medication Instructions:  Your physician has recommended you make the following change  in your medication:  STOP: Toprol-XL START: Cardizem 120 mg daily   *If you need a refill on your cardiac medications before your next appointment, please call your pharmacy*   Lab Work: Your physician recommends that you return for lab work: [place], Mag, Vitamin B12  If you have labs (blood work) drawn today and your tests are completely normal, you will receive your results only by: Marland Kitchen MyChart Message (if you have MyChart) OR . A paper copy in the mail If you have any lab test that is abnormal or we need to change your treatment, we will call you to review the results.   Testing/Procedures: None   Follow-Up: At [facility], you and your health needs are our priority.  As part of our continuing mission to provide you with exceptional heart care, we have created designated Provider Care Teams.  These Care Teams include your primary Cardiologist (physician) and Advanced Practice Providers (APPs -  Physician Assistants and Nurse Practitioners) who all work together to provide you with the care you need, when you need it.  We recommend signing up for the patient portal called "MyChart".  Sign up information is provided on this After Visit Summary.  MyChart is used to connect with patients for Virtual Visits (Telemedicine).  Patients are able to view lab/test results, encounter notes, upcoming appointments, etc.  Non-urgent messages can be sent to your provider as well.   To learn more about what you can do with MyChart, go to  [email or web address].    Your next appointment:   4 week(s)  The format for your next appointment:   In Person  Provider:   Berniece Salines, DO   Other Instructions        Adopting a Healthy Lifestyle.  Know what a healthy weight is for you (roughly BMI <25) and aim to maintain this   Aim for 7+ servings of fruits and vegetables daily   65-80+ fluid ounces of water or unsweet tea for healthy kidneys   Limit to max 1 drink of alcohol per day; avoid smoking/tobacco   Limit animal fats in diet for cholesterol and heart health - choose grass fed whenever available   Avoid highly processed foods, and foods high in saturated/trans fats   Aim for low stress - take time to unwind and care for your mental health   Aim for 150 min of moderate intensity exercise weekly for heart health, and weights twice weekly for bone health   Aim for 7-9 hours of sleep daily   When it comes to diets, agreement about the perfect plan isnt easy to find, even among the experts. Experts at the [facility] developed an idea known as the Healthy Eating Plate. Just imagine a plate divided into logical, healthy portions.   The emphasis is on diet quality:   Load up on vegetables and fruits - one-half of your plate: Aim for color and variety, and remember that potatoes dont count.   Go for whole grains - one-quarter of your plate: Whole wheat, barley, wheat berries, quinoa, oats, brown rice, and foods made with them. If you want pasta, go with whole wheat pasta.   Protein power - one-quarter of your plate: Fish, chicken, beans, and nuts are all healthy, versatile protein sources. Limit red meat.   The diet, however, does go beyond the plate, offering a few other suggestions.   Use healthy plant oils, such as olive, canola, soy, corn, sunflower and peanut. Check the  labels, and avoid partially hydrogenated oil, which have unhealthy trans fats.   If youre thirsty, drink water.  Coffee and tea are good in moderation, but skip sugary drinks and limit milk and dairy products to one or two daily servings.   The type of carbohydrate in the diet is more important than the amount. Some sources of carbohydrates, such as vegetables, fruits, whole grains, and beans-are healthier than others.   Finally, stay active  Signed, Berniece Salines, DO  11/13/2020 9:11 AM    [place]

## 2020-11-13 NOTE — Patient Instructions (Addendum)
Medication Instructions:  Your physician has recommended you make the following change in your medication:  STOP: Toprol-XL START: Cardizem 120 mg daily   *If you need a refill on your cardiac medications before your next appointment, please call your pharmacy*   Lab Work: Your physician recommends that you return for lab work: [place], Mag, Vitamin B12  If you have labs (blood work) drawn today and your tests are completely normal, you will receive your results only by: Marland Kitchen MyChart Message (if you have MyChart) OR . A paper copy in the mail If you have any lab test that is abnormal or we need to change your treatment, we will call you to review the results.   Testing/Procedures: None   Follow-Up: At [facility], you and your health needs are our priority.  As part of our continuing mission to provide you with exceptional heart care, we have created designated Provider Care Teams.  These Care Teams include your primary Cardiologist (physician) and Advanced Practice Providers (APPs -  Physician Assistants and Nurse Practitioners) who all work together to provide you with the care you need, when you need it.  We recommend signing up for the patient portal called "MyChart".  Sign up information is provided on this After Visit Summary.  MyChart is used to connect with patients for Virtual Visits (Telemedicine).  Patients are able to view lab/test results, encounter notes, upcoming appointments, etc.  Non-urgent messages can be sent to your provider as well.   To learn more about what you can do with MyChart, go to [email or web address].    Your next appointment:   4 week(s)  The format for your next appointment:   In Person  Provider:   Berniece Salines, DO   Other Instructions

## 2020-11-14 ENCOUNTER — Telehealth: Payer: Self-pay | Admitting: Cardiology

## 2020-11-14 ENCOUNTER — Telehealth: Payer: Self-pay | Admitting: *Deleted

## 2020-11-14 LAB — LUPUS ANTICOAGULANT PANEL
DRVVT: 42.2 s (ref 0.0–47.0)
PTT Lupus Anticoagulant: 35.5 s (ref 0.0–51.9)

## 2020-11-14 LAB — BETA-2-GLYCOPROTEIN I ABS, IGG/M/A
Beta-2 Glyco I IgG: 56 GPI IgG units — ABNORMAL HIGH (ref 0–20)
Beta-2-Glycoprotein I IgA: 13 GPI IgA units (ref 0–25)
Beta-2-Glycoprotein I IgM: 21 GPI IgM units (ref 0–32)

## 2020-11-14 LAB — PROTEIN S, TOTAL: Protein S Ag, Total: 73 % (ref 60–150)

## 2020-11-14 LAB — PROTEIN C ACTIVITY: Protein C Activity: 112 % (ref 73–180)

## 2020-11-14 LAB — HOMOCYSTEINE: Homocysteine: 11.5 umol/L (ref 0.0–14.5)

## 2020-11-14 LAB — PROTEIN C, TOTAL: Protein C, Total: 92 % (ref 60–150)

## 2020-11-14 LAB — PROTEIN S ACTIVITY: Protein S Activity: 86 % (ref 63–140)

## 2020-11-14 NOTE — Telephone Encounter (Signed)
No los 11/13/20

## 2020-11-14 NOTE — Telephone Encounter (Signed)
Pt c/o medication issue: 1. Name of Medication: Cardizem  2. How are you currently taking this medication (dosage and times per day)? 1 every 24 hours  3. Are you having a reaction (difficulty breathing--STAT)?  No  4. What is your medication issue? Making her very tired and feel slow and also report how the medication is doing.

## 2020-11-15 LAB — CARDIOLIPIN ANTIBODIES, IGG, IGM, IGA
Anticardiolipin IgA: <9 APL U/mL (ref 0–11)
Anticardiolipin IgG: 62 GPL U/mL — ABNORMAL HIGH (ref 0–14)
Anticardiolipin IgM: 31 MPL U/mL — ABNORMAL HIGH (ref 0–12)

## 2020-11-17 ENCOUNTER — Telehealth: Payer: Self-pay

## 2020-11-17 NOTE — Telephone Encounter (Signed)
Spoke with patient about her symptoms. Patient states she started Cardizem Thursday afternoon and slept all weekend.  She states she is feeling better today, but is having trouble sleeping today. She states the flutters are almost all the way gone. She states she thinks this medicine is working but she just wants to know is it going to keep making her sleepy when she takes it or will she "get use to it" and it will not make her drowsy anymore. Will speak with Dr. Harriet Masson for advice.

## 2020-11-19 ENCOUNTER — Other Ambulatory Visit: Payer: Self-pay

## 2020-11-19 LAB — PROTHROMBIN GENE MUTATION

## 2020-11-19 LAB — FACTOR 5 LEIDEN

## 2020-11-19 MED ORDER — PROPRANOLOL HCL 20 MG PO TABS: 20.0000 mg | ORAL_TABLET | Freq: Two times a day (BID) | ORAL | 1 refills | Status: DC

## 2020-11-19 NOTE — Progress Notes (Signed)
Prescription sent in pharmacy

## 2020-11-26 ENCOUNTER — Other Ambulatory Visit: Payer: Self-pay | Admitting: Family

## 2020-11-27 ENCOUNTER — Telehealth: Payer: Self-pay | Admitting: Cardiology

## 2020-11-27 NOTE — Telephone Encounter (Signed)
Patient called. She feels there is some miscommunication between the MyChart messages that have been sent back and forth between the patient and the office. The patient would like to speak to the Nurse on the phone for clarification.  Patient is due to take her medication at [facility] and wanted clarification before she is due to take her medication

## 2020-11-27 NOTE — Telephone Encounter (Signed)
Please let the patient know that it will be best that she come in for me to understand exactly what is going on, as her messages previously almost sounded as if she was not tolerating the propanolol due to side effects and that is why I suggested a change.  Now with her new message I am not sure what she needs.  So the best thing is for her to come in so we can discuss and I can also evaluate her needs.  I will prefer we communicate face-to-face which could be an in office visit or virtual visit.

## 2020-11-28 ENCOUNTER — Telehealth: Payer: Self-pay

## 2020-11-28 ENCOUNTER — Other Ambulatory Visit: Payer: Self-pay

## 2020-11-28 ENCOUNTER — Ambulatory Visit: Payer: BC Managed Care – PPO | Admitting: Cardiology

## 2020-11-28 ENCOUNTER — Encounter: Payer: Self-pay | Admitting: Cardiology

## 2020-11-28 VITALS — BP 128/82 | HR 77 | Ht 63.5 in | Wt 201.0 lb

## 2020-11-28 DIAGNOSIS — I471 Supraventricular tachycardia: Secondary | ICD-10-CM

## 2020-11-28 MED ORDER — PROPRANOLOL HCL 10 MG PO TABS: 10.0000 mg | ORAL_TABLET | Freq: Three times a day (TID) | ORAL | 3 refills | Status: DC

## 2020-11-28 MED ORDER — FLECAINIDE ACETATE 50 MG PO TABS: 50.0000 mg | ORAL_TABLET | Freq: Two times a day (BID) | ORAL | 3 refills | Status: DC

## 2020-11-28 NOTE — Patient Instructions (Signed)
Medication Instructions:  Your physician has recommended you make the following change in your medication: DECREASE: Propranolol 10 mg daily START:  Flecanide 50 mg twice daily  *If you need a refill on your cardiac medications before your next appointment, please call your pharmacy*   Lab Work: None If you have labs (blood work) drawn today and your tests are completely normal, you will receive your results only by: Marland Kitchen MyChart Message (if you have MyChart) OR . A paper copy in the mail If you have any lab test that is abnormal or we need to change your treatment, we will call you to review the results.   Testing/Procedures: None   Follow-Up: At [facility], you and your health needs are our priority.  As part of our continuing mission to provide you with exceptional heart care, we have created designated Provider Care Teams.  These Care Teams include your primary Cardiologist (physician) and Advanced Practice Providers (APPs -  Physician Assistants and Nurse Practitioners) who all work together to provide you with the care you need, when you need it.  We recommend signing up for the patient portal called "MyChart".  Sign up information is provided on this After Visit Summary.  MyChart is used to connect with patients for Virtual Visits (Telemedicine).  Patients are able to view lab/test results, encounter notes, upcoming appointments, etc.  Non-urgent messages can be sent to your provider as well.   To learn more about what you can do with MyChart, go to [email or web address].    Your next appointment:    March 24th, 2022  The format for your next appointment:   In Person  Provider:   Berniece Salines, DO   Other Instructions

## 2020-11-28 NOTE — Telephone Encounter (Signed)
Spoke with patient about making an appointment with Dr. Harriet Masson to discuss medication reactions she has had and medication changes that have happened since her last visit. Patient is willing to come in today.

## 2020-11-28 NOTE — Telephone Encounter (Signed)
Patient will be seen today at 1pm by Dr. Harriet Masson

## 2020-11-28 NOTE — Progress Notes (Signed)
Cardiology Office Note:    Date:  11/28/2020   ID:  Cristina Gong, DOB 1980-12-01, MRN [id number]  PCP:  Nicolette Bang, DO  Cardiologist:  Berniece Salines, DO  Electrophysiologist:  None   Referring MD: Caryl Never*   I had some diarrhea and stomach aches and pains on the propanolol is making you really tired but the diarrhea has stopped  History of Present Illness:    Cassidy [name] is a 40 y.o. female with a hx of antiphospholipid syndrome not on anticoagulation plans to see hematology today, PVCs, history of SVT and hyperlipidemia is here today for follow-up visit.  Of note the patient was diagnosed with antiphospholipid syndrome after she had a stillbirth and was tested.  She was recommended Coumadin at that time she declined and had not been on any anticoagulation and had not follow-up with hematology. She does have hypothyroidism and she sees endocrine for this.  I did see the patient on October 24, 2020 for the first time when she reported she had been experiencing significant palpitations and fluttering.  Thankfully she had not passed out but the fluttering was significant and she was concerned about this.  At the end of the visit I placed a monitor and recommend an echocardiogram.  She was able to get his testing done in the interim.  I did see the patient in November 13, 2020 at that time we discussed her monitor results which showed paroxysmal atrial tachycardia along with symptomatic PACs.  I started patient on metoprolol and she tells me that her symptoms were worse on metoprolol so stopped this medication.  I started patient on propanolol 20 mg twice a day, she did call reported that she was experiencing some diarrhea and stomach aches and pains on the propanolol recommended during the time that we switch her to Cardizem but she declined reporting that she has significant bad reaction with Cardizem which made her significantly tired.  Today she is here for  follow-up visit.  There are some aches and pains in the diarrhea has resolved but she still is tired.   Past Medical History:  Diagnosis Date  . Acquired hypothyroidism   . Antiphospholipid antibody syndrome ([place])   . Atrial fibrillation ([place])   . Autoimmune disorder ([place])   . GERD (gastroesophageal reflux disease)   . Mixed hyperlipidemia   . Morbid obesity ([place])   . Restless leg syndrome   . Tachycardia     Past Surgical History:  Procedure Laterality Date  . TONSILLECTOMY      Current Medications: Current Meds  Medication Sig  . dicyclomine (BENTYL) 20 MG tablet Take 40 mg by mouth every 6 (six) hours as needed (IBS).  Marland Kitchen levothyroxine (SYNTHROID) 175 MCG tablet TAKE ONE TABLET BY MOUTH DAILY  . Melatonin 3 MG CAPS Take 3 mg by mouth at bedtime.  . sucralfate (CARAFATE) 1 g tablet Take 1 g by mouth 4 (four) times daily -  with meals and at bedtime. PRN  . [DISCONTINUED] propranolol (INDERAL) 20 MG tablet Take 1 tablet (20 mg total) by mouth 2 (two) times daily.     Allergies:   Statins, Benadryl [diphenhydramine], Ciprocinonide [fluocinolone], Ciprofloxacin, Nsaids, Tolmetin, and Aspirin   Social History   Socioeconomic History  . Marital status: Married    Spouse name: Not on file  . Number of children: Not on file  . Years of education: Not on file  . Highest education level: Not on file  Occupational History  .  Not on file  Tobacco Use  . Smoking status: Current Some Day Smoker    Packs/day: 1.00    Types: E-cigarettes  . Smokeless tobacco: Never Used  . Tobacco comment: vape only  Vaping Use  . Vaping Use: Every day  Substance and Sexual Activity  . Alcohol use: Yes    Comment: social  . Drug use: No  . Sexual activity: Yes    Partners: Female    Birth control/protection: None  Other Topics Concern  . Not on file  Social History Narrative  . Not on file   Social Determinants of Health   Financial Resource Strain: Not on file  Food Insecurity:  Not on file  Transportation Needs: Not on file  Physical Activity: Not on file  Stress: Not on file  Social Connections: Not on file     Family History: The patient's family history includes Breast cancer in her maternal aunt and maternal grandmother; Healthy in her brother; Hyperlipidemia in her father and mother; Supraventricular tachycardia in her mother.  ROS:   Review of Systems  Constitution: Negative for decreased appetite, fever and weight gain.  HENT: Negative for congestion, ear discharge, hoarse voice and sore throat.   Eyes: Negative for discharge, redness, vision loss in right eye and visual halos.  Cardiovascular: Negative for chest pain, dyspnea on exertion, leg swelling, orthopnea and palpitations.  Respiratory: Negative for cough, hemoptysis, shortness of breath and snoring.   Endocrine: Negative for heat intolerance and polyphagia.  Hematologic/Lymphatic: Negative for bleeding problem. Does not bruise/bleed easily.  Skin: Negative for flushing, nail changes, rash and suspicious lesions.  Musculoskeletal: Negative for arthritis, joint pain, muscle cramps, myalgias, neck pain and stiffness.  Gastrointestinal: Negative for abdominal pain, bowel incontinence, diarrhea and excessive appetite.  Genitourinary: Negative for decreased libido, genital sores and incomplete emptying.  Neurological: Negative for brief paralysis, focal weakness, headaches and loss of balance.  Psychiatric/Behavioral: Negative for altered mental status, depression and suicidal ideas.  Allergic/Immunologic: Negative for HIV exposure and persistent infections.    EKGs/Labs/Other Studies Reviewed:    The following studies were reviewed today:   EKG:  The ekg ordered today demonstrates   November 04, 2020 Transthoracic echocardiogram IMPRESSIONS  1. Left ventricular ejection fraction, by estimation, is 60 to 65%. The left ventricle has normal function. The left ventricle has no regional wall motion  abnormalities. Left ventricular diastolic parameters were normal.  2. Right ventricular systolic function is normal. The right ventricular size is normal. There is normal pulmonary artery systolic pressure. The estimated right ventricular systolic pressure is 06.2 mmHg.  3. The mitral valve is grossly normal. Trivial mitral valve regurgitation. No evidence of mitral stenosis.  4. The aortic valve is tricuspid. Aortic valve regurgitation is not visualized. No aortic stenosis is present.  5. The inferior vena cava is normal in size with greater than 50% respiratory variability, suggesting right atrial pressure of 3 mmHg.    The patient wore the monitor for 12 days 22 hours hours starting October 16, 2020. Indication: Palpitations  The minimum heart rate was 43 bpm, maximum heart rate was 169 bpm, and average heart rate was 81 bpm. Predominant underlying rhythm was Sinus Rhythm.   54 Supraventricular Tachycardia runs occurred, the run with the fastest interval lasting 8 beats with a maximum rate of 169 bpm (average 54 bpm); the run with the fastest interval was also the longest.  Premature atrial complexes were occasional (4.0%,57584). Premature Ventricular complexes were rare less than 1%.  No ventricular tachycardia, no pauses, No AV block and no atrial fibrillation present. 97 patient triggered events: One associated with supraventricular tachycardia and the remaining associated with premature atrial complexes sinus rhythm.  Conclusion: This study is remarkable for the following:                                  1. Symptomatic occasional premature atrial complex.                                  2. Supraventricular tachycardia which is likely atrial tachycardia with variable block.   Recent Labs: 10/09/2020: TSH 2.376 11/13/2020: ALT 10; BUN 14; Creatinine 0.84; Hemoglobin 12.5; Magnesium 2.1; Platelet Count 285; Potassium 3.7; Sodium 137  Recent Lipid Panel    Component Value  Date/Time   CHOL 214 (H) 02/28/2020 0907   TRIG 96 02/28/2020 0907   HDL 58 02/28/2020 0907   CHOLHDL 3.7 02/28/2020 0907   LDLCALC 139 (H) 02/28/2020 0907    Physical Exam:    VS:  BP 128/82 (BP Location: Right Arm, Patient Position: Sitting)   Pulse 77   Ht 5' 3.5" (1.613 m)   Wt 201 lb (91.2 kg)   SpO2 98%   BMI 35.05 kg/m     Wt Readings from Last 3 Encounters:  11/28/20 201 lb (91.2 kg)  11/13/20 203 lb (92.1 kg)  11/13/20 203 lb (92.1 kg)     GEN: Well nourished, well developed in no acute distress HEENT: Normal NECK: No JVD; No carotid bruits LYMPHATICS: No lymphadenopathy CARDIAC: S1S2 noted,RRR, no murmurs, rubs, gallops RESPIRATORY:  Clear to auscultation without rales, wheezing or rhonchi  ABDOMEN: Soft, non-tender, non-distended, +bowel sounds, no guarding. EXTREMITIES: No edema, No cyanosis, no clubbing MUSCULOSKELETAL:  No deformity  SKIN: Warm and dry NEUROLOGIC:  Alert and oriented x 3, non-focal PSYCHIATRIC:  Normal affect, good insight  ASSESSMENT:    1. PAT (paroxysmal atrial tachycardia) (HCC)    PLAN:     She has had some symptomatic improvement on the propanolol but she has had side effects that is intolerable on her higher dose.  We will like to do is cut back on her propanolol to 10 mg daily.  We discussed all started patient on flecainide 50 mg twice daily in addition to her propanolol.  I talked to the patient about this medication.  At this time she does not have any specific questions.  General questions that she had I was able to answer them.  I will see the patient in 3 weeks.  The patient is in agreement with the above plan. The patient left the office in stable condition.  The patient will follow up in   Medication Adjustments/Labs and Tests Ordered: Current medicines are reviewed at length with the patient today.  Concerns regarding medicines are outlined above.  No orders of the defined types were placed in this encounter.  No  orders of the defined types were placed in this encounter.   Patient Instructions  Medication Instructions:  Your physician has recommended you make the following change in your medication: DECREASE: Propranolol 10 mg daily START:  Flecanide 50 mg twice daily  *If you need a refill on your cardiac medications before your next appointment, please call your pharmacy*   Lab Work: None If you have labs (blood work) drawn today and your  tests are completely normal, you will receive your results only by: Marland Kitchen MyChart Message (if you have MyChart) OR . A paper copy in the mail If you have any lab test that is abnormal or we need to change your treatment, we will call you to review the results.   Testing/Procedures: None   Follow-Up: At [facility], you and your health needs are our priority.  As part of our continuing mission to provide you with exceptional heart care, we have created designated Provider Care Teams.  These Care Teams include your primary Cardiologist (physician) and Advanced Practice Providers (APPs -  Physician Assistants and Nurse Practitioners) who all work together to provide you with the care you need, when you need it.  We recommend signing up for the patient portal called "MyChart".  Sign up information is provided on this After Visit Summary.  MyChart is used to connect with patients for Virtual Visits (Telemedicine).  Patients are able to view lab/test results, encounter notes, upcoming appointments, etc.  Non-urgent messages can be sent to your provider as well.   To learn more about what you can do with MyChart, go to [email or web address].    Your next appointment:    March 24th, 2022  The format for your next appointment:   In Person  Provider:   Berniece Salines, DO   Other Instructions      Adopting a Healthy Lifestyle.  Know what a healthy weight is for you (roughly BMI <25) and aim to maintain this   Aim for 7+ servings of fruits and vegetables  daily   65-80+ fluid ounces of water or unsweet tea for healthy kidneys   Limit to max 1 drink of alcohol per day; avoid smoking/tobacco   Limit animal fats in diet for cholesterol and heart health - choose grass fed whenever available   Avoid highly processed foods, and foods high in saturated/trans fats   Aim for low stress - take time to unwind and care for your mental health   Aim for 150 min of moderate intensity exercise weekly for heart health, and weights twice weekly for bone health   Aim for 7-9 hours of sleep daily   When it comes to diets, agreement about the perfect plan isnt easy to find, even among the experts. Experts at the [place] developed an idea known as the Healthy Eating Plate. Just imagine a plate divided into logical, healthy portions.   The emphasis is on diet quality:   Load up on vegetables and fruits - one-half of your plate: Aim for color and variety, and remember that potatoes dont count.   Go for whole grains - one-quarter of your plate: Whole wheat, barley, wheat berries, quinoa, oats, brown rice, and foods made with them. If you want pasta, go with whole wheat pasta.   Protein power - one-quarter of your plate: Fish, chicken, beans, and nuts are all healthy, versatile protein sources. Limit red meat.   The diet, however, does go beyond the plate, offering a few other suggestions.   Use healthy plant oils, such as olive, canola, soy, corn, sunflower and peanut. Check the labels, and avoid partially hydrogenated oil, which have unhealthy trans fats.   If youre thirsty, drink water. Coffee and tea are good in moderation, but skip sugary drinks and limit milk and dairy products to one or two daily servings.   The type of carbohydrate in the diet is more important than the amount. Some sources of  carbohydrates, such as vegetables, fruits, whole grains, and beans-are healthier than others.   Finally, stay  active  Signed, Berniece Salines, DO  11/28/2020 1:33 PM    [facility]

## 2020-12-01 ENCOUNTER — Telehealth: Payer: Self-pay | Admitting: Cardiology

## 2020-12-01 NOTE — Telephone Encounter (Signed)
Pt c/o medication issue:  1. Name of Medication:  propranolol (INDERAL) 10 MG tablet  2. How are you currently taking this medication (dosage and times per day)? Needs clarification   3. Are you having a reaction (difficulty breathing--STAT)? no  4. What is your medication issue? Patient states she was told to take one 10mg  tablet daily by Dr. Harriet Masson, but the prescription from the pharmacy states one 10 mg tablet 3 times daily. Patient is concerned and would like to know how she is to take the medication.

## 2020-12-02 ENCOUNTER — Other Ambulatory Visit: Payer: Self-pay

## 2020-12-02 MED ORDER — PROPRANOLOL HCL 10 MG PO TABS: 10.0000 mg | ORAL_TABLET | Freq: Every day | ORAL | 3 refills | Status: DC

## 2020-12-02 NOTE — Telephone Encounter (Signed)
The order was entered incorrectly. The corrected order has been sent in. Patient was called to inform her she was correct in taking the Propranolol 10 mg daily.

## 2020-12-02 NOTE — Telephone Encounter (Signed)
Spoke with patient. Orders clarified.

## 2020-12-02 NOTE — Progress Notes (Signed)
The order was entered incorrectly. The corrected order has been sent in. Patient was called to inform her she was correct in taking the Propranolol 10 mg daily.

## 2020-12-16 DIAGNOSIS — I4891 Unspecified atrial fibrillation: Secondary | ICD-10-CM | POA: Insufficient documentation

## 2020-12-18 ENCOUNTER — Encounter: Payer: Self-pay | Admitting: Cardiology

## 2020-12-18 ENCOUNTER — Other Ambulatory Visit: Payer: Self-pay

## 2020-12-18 ENCOUNTER — Ambulatory Visit: Payer: BC Managed Care – PPO | Admitting: Cardiology

## 2020-12-18 VITALS — BP 120/82 | HR 78 | Ht 63.0 in | Wt 205.0 lb

## 2020-12-18 DIAGNOSIS — I471 Supraventricular tachycardia: Secondary | ICD-10-CM

## 2020-12-18 DIAGNOSIS — R42 Dizziness and giddiness: Secondary | ICD-10-CM

## 2020-12-18 DIAGNOSIS — D6861 Antiphospholipid syndrome: Secondary | ICD-10-CM | POA: Diagnosis not present

## 2020-12-18 DIAGNOSIS — Z79899 Other long term (current) drug therapy: Secondary | ICD-10-CM

## 2020-12-18 DIAGNOSIS — E782 Mixed hyperlipidemia: Secondary | ICD-10-CM | POA: Diagnosis not present

## 2020-12-18 NOTE — Patient Instructions (Addendum)
Medication Instructions:  Your physician recommends that you continue on your current medications as directed. Please refer to the Current Medication list given to you today.  *If you need a refill on your cardiac medications before your next appointment, please call your pharmacy*   Lab Work: Your physician recommends that you return for lab work: TODAY: BMET, Mag, Flecainide level If you have labs (blood work) drawn today and your tests are completely normal, you will receive your results only by: Marland Kitchen MyChart Message (if you have MyChart) OR . A paper copy in the mail If you have any lab test that is abnormal or we need to change your treatment, we will call you to review the results.   Testing/Procedures: None   Follow-Up: At [facility], you and your health needs are our priority.  As part of our continuing mission to provide you with exceptional heart care, we have created designated Provider Care Teams.  These Care Teams include your primary Cardiologist (physician) and Advanced Practice Providers (APPs -  Physician Assistants and Nurse Practitioners) who all work together to provide you with the care you need, when you need it.  We recommend signing up for the patient portal called "MyChart".  Sign up information is provided on this After Visit Summary.  MyChart is used to connect with patients for Virtual Visits (Telemedicine).  Patients are able to view lab/test results, encounter notes, upcoming appointments, etc.  Non-urgent messages can be sent to your provider as well.   To learn more about what you can do with MyChart, go to [email or web address].    Your next appointment:   6 month(s)  The format for your next appointment:   In Person  Provider:   Berniece Salines, DO   Other Instructions

## 2020-12-18 NOTE — Progress Notes (Signed)
Cardiology Office Note:    Date:  12/18/2020   ID:  Cassidy [name], DOB 10/27/1980, MRN [id number]  PCP:  Nicolette Bang, DO  Cardiologist:  Berniece Salines, DO  Electrophysiologist:  None   Referring MD: Caryl Never*   I am so a lot better on the medicine with the palpitations but I am still dizzy  History of Present Illness:    Cassidy [name] is a 40 y.o. female with a hx of  with a hx of antiphospholipid syndrome not on anticoagulation plans to see hematology today, PVCs, history of SVT and hyperlipidemia is here today for follow-up visit.  Of note the patient was diagnosed with antiphospholipid syndrome after she had a stillbirth and was tested. She was recommended Coumadin at that time she declined and had not been on any anticoagulation and had not follow-up with hematology. She does have hypothyroidism and she sees endocrine for this.  I did see the patient on October 24, 2020 for the first time when she reported she had been experiencing significant palpitations and fluttering. Thankfully she had not passed out but the fluttering was significant and she was concerned about this. At the end of the visit I placed a monitor and recommend an echocardiogram. She was able to get his testing done in the interim.  I did see the patient in November 13, 2020 at that time we discussed her monitor results which showed paroxysmal atrial tachycardia along with symptomatic PACs.  I started patient on metoprolol and she tells me that her symptoms were worse on metoprolol so stopped this medication.  I started patient on propanolol 20 mg twice a day, she did call reported that she was experiencing some diarrhea and stomach aches and pains on the propanolol recommended during the time that we switch her to Cardizem but she declined reporting that she has significant bad reaction with Cardizem which made her significantly tired.   I saw the patient on November 28, 2020 at that time she  was still experiencing palpitations we will place the patient on flecainide 50 mg twice a day and continue her propanolol 10 mg daily.  Today she tells me her palpitations have improved significantly.  But she is experiencing some dizziness.  She notes that she has a history of vertigo and has significant sinusitis and have been having problem with these recently.  Also tells me she did have some leg calf pain the last couple mornings that she woke up.  It feels crampy.  Past Medical History:  Diagnosis Date  . Acquired hypothyroidism   . Antiphospholipid antibody syndrome ([place])   . Atrial fibrillation ([place])   . Autoimmune disorder ([place])   . GERD (gastroesophageal reflux disease)   . Mixed hyperlipidemia   . Morbid obesity ([place])   . Restless leg syndrome   . Tachycardia     Past Surgical History:  Procedure Laterality Date  . TONSILLECTOMY      Current Medications: Current Meds  Medication Sig  . dicyclomine (BENTYL) 20 MG tablet Take 40 mg by mouth every 6 (six) hours as needed (IBS).  . flecainide (TAMBOCOR) 50 MG tablet Take 1 tablet (50 mg total) by mouth 2 (two) times daily.  Marland Kitchen levothyroxine (SYNTHROID) 175 MCG tablet TAKE ONE TABLET BY MOUTH DAILY  . Melatonin 3 MG CAPS Take 3 mg by mouth at bedtime.  . propranolol (INDERAL) 10 MG tablet Take 1 tablet (10 mg total) by mouth daily.  . sucralfate (CARAFATE) 1 g tablet  Take 1 g by mouth 4 (four) times daily -  with meals and at bedtime. PRN     Allergies:   Statins, Benadryl [diphenhydramine], Ciprocinonide [fluocinolone], Ciprofloxacin, Nsaids, Tolmetin, and Aspirin   Social History   Socioeconomic History  . Marital status: Married    Spouse name: Not on file  . Number of children: Not on file  . Years of education: Not on file  . Highest education level: Not on file  Occupational History  . Not on file  Tobacco Use  . Smoking status: Current Some Day Smoker    Packs/day: 1.00    Types: E-cigarettes  . Smokeless  tobacco: Never Used  . Tobacco comment: vape only  Vaping Use  . Vaping Use: Every day  Substance and Sexual Activity  . Alcohol use: Yes    Comment: social  . Drug use: No  . Sexual activity: Yes    Partners: Female    Birth control/protection: None  Other Topics Concern  . Not on file  Social History Narrative  . Not on file   Social Determinants of Health   Financial Resource Strain: Not on file  Food Insecurity: Not on file  Transportation Needs: Not on file  Physical Activity: Not on file  Stress: Not on file  Social Connections: Not on file     Family History: The patient's family history includes Breast cancer in her maternal aunt and maternal grandmother; Healthy in her brother; Hyperlipidemia in her father and mother; Supraventricular tachycardia in her mother.  ROS:   Review of Systems  Constitution: Negative for decreased appetite, fever and weight gain.  HENT: Negative for congestion, ear discharge, hoarse voice and sore throat.   Eyes: Negative for discharge, redness, vision loss in right eye and visual halos.  Cardiovascular: Negative for chest pain, dyspnea on exertion, leg swelling, orthopnea and palpitations.  Respiratory: Negative for cough, hemoptysis, shortness of breath and snoring.   Endocrine: Negative for heat intolerance and polyphagia.  Hematologic/Lymphatic: Negative for bleeding problem. Does not bruise/bleed easily.  Skin: Negative for flushing, nail changes, rash and suspicious lesions.  Musculoskeletal: Negative for arthritis, joint pain, muscle cramps, myalgias, neck pain and stiffness.  Gastrointestinal: Negative for abdominal pain, bowel incontinence, diarrhea and excessive appetite.  Genitourinary: Negative for decreased libido, genital sores and incomplete emptying.  Neurological: Negative for brief paralysis, focal weakness, headaches and loss of balance.  Psychiatric/Behavioral: Negative for altered mental status, depression and  suicidal ideas.  Allergic/Immunologic: Negative for HIV exposure and persistent infections.    EKGs/Labs/Other Studies Reviewed:    The following studies were reviewed today:   EKG:   None today  November 04, 2020 Transthoracic echocardiogram IMPRESSIONS  1. Left ventricular ejection fraction, by estimation, is 60 to 65%. The left ventricle has normal function. The left ventricle has no regional wall motion abnormalities. Left ventricular diastolic parameters were normal.  2. Right ventricular systolic function is normal. The right ventricular size is normal. There is normal pulmonary artery systolic pressure. The estimated right ventricular systolic pressure is 10.2 mmHg.  3. The mitral valve is grossly normal. Trivial mitral valve regurgitation. No evidence of mitral stenosis.  4. The aortic valve is tricuspid. Aortic valve regurgitation is not visualized. No aortic stenosis is present.  5. The inferior vena cava is normal in size with greater than 50% respiratory variability, suggesting right atrial pressure of 3 mmHg.    The patient wore the monitor for 12 days 22 hours hours starting October 16, 2020.  Indication: Palpitations  The minimum heart rate was 43 bpm, maximum heart rate was 169 bpm, and average heart rate was 81 bpm. Predominant underlying rhythm was Sinus Rhythm.  54 Supraventricular Tachycardia runs occurred, the run with the fastest interval lasting 8 beats with a maximum rate of 169 bpm (average 54 bpm); the run with the fastest interval was also the longest.  Premature atrial complexes were occasional (4.0%,57584). Premature Ventricular complexes were rare less than 1%.  No ventricular tachycardia, no pauses, No AV block and no atrial fibrillation present. 97 patient triggered events: One associated with supraventricular tachycardia and the remaining associated with premature atrial complexes sinus rhythm.  Conclusion: This study is remarkable for the  following: 1. Symptomatic occasional premature atrial complex. 2. Supraventricular tachycardia which is likely atrial tachycardia with variable block.   Recent Labs: 10/09/2020: TSH 2.376 11/13/2020: ALT 10; BUN 14; Creatinine 0.84; Hemoglobin 12.5; Magnesium 2.1; Platelet Count 285; Potassium 3.7; Sodium 137  Recent Lipid Panel    Component Value Date/Time   CHOL 214 (H) 02/28/2020 0907   TRIG 96 02/28/2020 0907   HDL 58 02/28/2020 0907   CHOLHDL 3.7 02/28/2020 0907   LDLCALC 139 (H) 02/28/2020 0907    Physical Exam:    VS:  BP 120/82   Pulse 78   Ht 5\' 3"  (1.6 m)   Wt 205 lb (93 kg)   SpO2 98%   BMI 36.31 kg/m     Wt Readings from Last 3 Encounters:  12/18/20 205 lb (93 kg)  11/28/20 201 lb (91.2 kg)  11/13/20 203 lb (92.1 kg)     GEN: Well nourished, well developed in no acute distress HEENT: Normal NECK: No JVD; No carotid bruits LYMPHATICS: No lymphadenopathy CARDIAC: S1S2 noted,RRR, no murmurs, rubs, gallops RESPIRATORY:  Clear to auscultation without rales, wheezing or rhonchi  ABDOMEN: Soft, non-tender, non-distended, +bowel sounds, no guarding. EXTREMITIES: No edema, No cyanosis, no clubbing MUSCULOSKELETAL:  No deformity  SKIN: Warm and dry NEUROLOGIC:  Alert and oriented x 3, non-focal PSYCHIATRIC:  Normal affect, good insight  ASSESSMENT:    1. PAT (paroxysmal atrial tachycardia) ([place])   2. Antiphospholipid antibody syndrome ([facility])   3. Morbid obesity ([place])   4. Mixed hyperlipidemia   5. Dizziness    PLAN:     1.  She feels better from the palpitation standpoint.  She thinks the flecainide with her propranolol is working.  We will keep her on this regimen.  We will get blood work today for medication management.  2.  Dizziness could be multifactorial related to her sinus as well for history of vertigo.  She has meclizine that she has not tried.  We discussed she also would like to  see ENT I will refer her.  The patient needs to establish care with a primary care doctor.  The patient understands the need to lose weight with diet and exercise. We have discussed specific strategies for this.  The patient is in agreement with the above plan. The patient left the office in stable condition.  The patient will follow up in   Medication Adjustments/Labs and Tests Ordered: Current medicines are reviewed at length with the patient today.  Concerns regarding medicines are outlined above.  No orders of the defined types were placed in this encounter.  No orders of the defined types were placed in this encounter.   Patient Instructions  Medication Instructions:  Your physician recommends that you continue on your current medications as directed. Please refer to  the Current Medication list given to you today.  *If you need a refill on your cardiac medications before your next appointment, please call your pharmacy*   Lab Work: Your physician recommends that you return for lab work: TODAY: BMET, [place] If you have labs (blood work) drawn today and your tests are completely normal, you will receive your results only by: Marland Kitchen MyChart Message (if you have MyChart) OR . A paper copy in the mail If you have any lab test that is abnormal or we need to change your treatment, we will call you to review the results.   Testing/Procedures: None   Follow-Up: At [facility], you and your health needs are our priority.  As part of our continuing mission to provide you with exceptional heart care, we have created designated Provider Care Teams.  These Care Teams include your primary Cardiologist (physician) and Advanced Practice Providers (APPs -  Physician Assistants and Nurse Practitioners) who all work together to provide you with the care you need, when you need it.  We recommend signing up for the patient portal called "MyChart".  Sign up information is provided on this After Visit  Summary.  MyChart is used to connect with patients for Virtual Visits (Telemedicine).  Patients are able to view lab/test results, encounter notes, upcoming appointments, etc.  Non-urgent messages can be sent to your provider as well.   To learn more about what you can do with MyChart, go to [email or web address].    Your next appointment:   6 month(s)  The format for your next appointment:   In Person  Provider:   Berniece Salines, DO   Other Instructions      Adopting a Healthy Lifestyle.  Know what a healthy weight is for you (roughly BMI <25) and aim to maintain this   Aim for 7+ servings of fruits and vegetables daily   65-80+ fluid ounces of water or unsweet tea for healthy kidneys   Limit to max 1 drink of alcohol per day; avoid smoking/tobacco   Limit animal fats in diet for cholesterol and heart health - choose grass fed whenever available   Avoid highly processed foods, and foods high in saturated/trans fats   Aim for low stress - take time to unwind and care for your mental health   Aim for 150 min of moderate intensity exercise weekly for heart health, and weights twice weekly for bone health   Aim for 7-9 hours of sleep daily   When it comes to diets, agreement about the perfect plan isnt easy to find, even among the experts. Experts at the [employer] developed an idea known as the Healthy Eating Plate. Just imagine a plate divided into logical, healthy portions.   The emphasis is on diet quality:   Load up on vegetables and fruits - one-half of your plate: Aim for color and variety, and remember that potatoes dont count.   Go for whole grains - one-quarter of your plate: Whole wheat, barley, wheat berries, quinoa, oats, brown rice, and foods made with them. If you want pasta, go with whole wheat pasta.   Protein power - one-quarter of your plate: Fish, chicken, beans, and nuts are all healthy, versatile protein sources. Limit red  meat.   The diet, however, does go beyond the plate, offering a few other suggestions.   Use healthy plant oils, such as olive, canola, soy, corn, sunflower and peanut. Check the labels, and avoid partially hydrogenated oil, which have  unhealthy trans fats.   If youre thirsty, drink water. Coffee and tea are good in moderation, but skip sugary drinks and limit milk and dairy products to one or two daily servings.   The type of carbohydrate in the diet is more important than the amount. Some sources of carbohydrates, such as vegetables, fruits, whole grains, and beans-are healthier than others.   Finally, stay active  Signed, Berniece Salines, DO  12/18/2020 9:28 AM    [facility]

## 2020-12-22 ENCOUNTER — Other Ambulatory Visit: Payer: Self-pay

## 2020-12-22 MED ORDER — FLECAINIDE ACETATE 50 MG PO TABS: 50.0000 mg | ORAL_TABLET | Freq: Two times a day (BID) | ORAL | 11 refills | Status: DC

## 2020-12-22 NOTE — Progress Notes (Signed)
Refill sent to pharmacy.   

## 2020-12-23 ENCOUNTER — Other Ambulatory Visit: Payer: Self-pay

## 2020-12-23 LAB — BASIC METABOLIC PANEL
BUN/Creatinine Ratio: 13 (ref 9–23)
BUN: 11 mg/dL (ref 6–24)
CO2: 21 mmol/L (ref 20–29)
Calcium: 9.1 mg/dL (ref 8.7–10.2)
Chloride: 100 mmol/L (ref 96–106)
Creatinine, Ser: 0.85 mg/dL (ref 0.57–1.00)
Glucose: 95 mg/dL (ref 65–99)
Potassium: 4.3 mmol/L (ref 3.5–5.2)
Sodium: 134 mmol/L (ref 134–144)
eGFR: 88 mL/min/{1.73_m2} (ref >59)

## 2020-12-23 LAB — FLECAINIDE LEVEL: Flecainide: 0.15 ug/mL — ABNORMAL LOW (ref 0.20–1.00)

## 2020-12-23 LAB — MAGNESIUM: Magnesium: 2.3 mg/dL (ref 1.6–2.3)

## 2020-12-23 MED ORDER — FLECAINIDE ACETATE 100 MG PO TABS
100.0000 mg | ORAL_TABLET | Freq: Two times a day (BID) | ORAL | 11 refills | Status: DC
Start: 2020-12-23 — End: 2021-12-30

## 2020-12-23 NOTE — Progress Notes (Signed)
Prescription sent to pharmacy.

## 2021-01-12 DIAGNOSIS — I4891 Unspecified atrial fibrillation: Secondary | ICD-10-CM | POA: Insufficient documentation

## 2021-01-15 ENCOUNTER — Ambulatory Visit: Payer: BC Managed Care – PPO | Admitting: Cardiology

## 2021-02-17 ENCOUNTER — Encounter: Payer: Self-pay | Admitting: Nurse Practitioner

## 2021-02-18 ENCOUNTER — Encounter: Payer: Self-pay | Admitting: Nurse Practitioner

## 2021-02-18 ENCOUNTER — Other Ambulatory Visit (HOSPITAL_COMMUNITY)
Admission: RE | Admit: 2021-02-18 | Discharge: 2021-02-18 | Disposition: A | Discharge status: Home / Self Care | Payer: BC Managed Care – PPO | Source: Ambulatory Visit | Attending: Nurse Practitioner | Admitting: Nurse Practitioner

## 2021-02-18 ENCOUNTER — Other Ambulatory Visit: Payer: Self-pay

## 2021-02-18 ENCOUNTER — Ambulatory Visit (INDEPENDENT_AMBULATORY_CARE_PROVIDER_SITE_OTHER): Payer: BC Managed Care – PPO | Admitting: Nurse Practitioner

## 2021-02-18 VITALS — BP 118/78 | Ht 63.0 in | Wt 205.0 lb

## 2021-02-18 DIAGNOSIS — Z01419 Encounter for gynecological examination (general) (routine) without abnormal findings: Secondary | ICD-10-CM

## 2021-02-18 DIAGNOSIS — Z113 Encounter for screening for infections with a predominantly sexual mode of transmission: Secondary | ICD-10-CM | POA: Diagnosis not present

## 2021-02-18 DIAGNOSIS — N930 Postcoital and contact bleeding: Secondary | ICD-10-CM | POA: Diagnosis not present

## 2021-02-18 DIAGNOSIS — N921 Excessive and frequent menstruation with irregular cycle: Secondary | ICD-10-CM | POA: Diagnosis not present

## 2021-02-18 NOTE — Patient Instructions (Signed)
Health Maintenance, Female Adopting a healthy lifestyle and getting preventive care are important in promoting health and wellness. Ask your health care provider about:  The right schedule for you to have regular tests and exams.  Things you can do on your own to prevent diseases and keep yourself healthy. What should I know about diet, weight, and exercise? Eat a healthy diet  Eat a diet that includes plenty of vegetables, fruits, low-fat dairy products, and lean protein.  Do not eat a lot of foods that are high in solid fats, added sugars, or sodium.   Maintain a healthy weight Body mass index (BMI) is used to identify weight problems. It estimates body fat based on height and weight. Your health care provider can help determine your BMI and help you achieve or maintain a healthy weight. Get regular exercise Get regular exercise. This is one of the most important things you can do for your health. Most adults should:  Exercise for at least 150 minutes each week. The exercise should increase your heart rate and make you sweat (moderate-intensity exercise).  Do strengthening exercises at least twice a week. This is in addition to the moderate-intensity exercise.  Spend less time sitting. Even light physical activity can be beneficial. Watch cholesterol and blood lipids Have your blood tested for lipids and cholesterol at 42 years of age, then have this test every 5 years. Have your cholesterol levels checked more often if:  Your lipid or cholesterol levels are high.  You are older than 42 years of age.  You are at high risk for heart disease. What should I know about cancer screening? Depending on your health history and family history, you may need to have cancer screening at various ages. This may include screening for:  Breast cancer.  Cervical cancer.  Colorectal cancer.  Skin cancer.  Lung cancer. What should I know about heart disease, diabetes, and high blood  pressure? Blood pressure and heart disease  High blood pressure causes heart disease and increases the risk of stroke. This is more likely to develop in people who have high blood pressure readings, are of African descent, or are overweight.  Have your blood pressure checked: ? Every 3-5 years if you are 18-39 years of age. ? Every year if you are 40 years old or older. Diabetes Have regular diabetes screenings. This checks your fasting blood sugar level. Have the screening done:  Once every three years after age 40 if you are at a normal weight and have a low risk for diabetes.  More often and at a younger age if you are overweight or have a high risk for diabetes. What should I know about preventing infection? Hepatitis B If you have a higher risk for hepatitis B, you should be screened for this virus. Talk with your health care provider to find out if you are at risk for hepatitis B infection. Hepatitis C Testing is recommended for:  Everyone born from 1945 through 1965.  Anyone with known risk factors for hepatitis C. Sexually transmitted infections (STIs)  Get screened for STIs, including gonorrhea and chlamydia, if: ? You are sexually active and are younger than 42 years of age. ? You are older than 42 years of age and your health care provider tells you that you are at risk for this type of infection. ? Your sexual activity has changed since you were last screened, and you are at increased risk for chlamydia or gonorrhea. Ask your health care provider   if you are at risk.  Ask your health care provider about whether you are at high risk for HIV. Your health care provider may recommend a prescription medicine to help prevent HIV infection. If you choose to take medicine to prevent HIV, you should first get tested for HIV. You should then be tested every 3 months for as long as you are taking the medicine. Pregnancy  If you are about to stop having your period (premenopausal) and  you may become pregnant, seek counseling before you get pregnant.  Take 400 to 800 micrograms (mcg) of folic acid every day if you become pregnant.  Ask for birth control (contraception) if you want to prevent pregnancy. Osteoporosis and menopause Osteoporosis is a disease in which the bones lose minerals and strength with aging. This can result in bone fractures. If you are 65 years old or older, or if you are at risk for osteoporosis and fractures, ask your health care provider if you should:  Be screened for bone loss.  Take a calcium or vitamin D supplement to lower your risk of fractures.  Be given hormone replacement therapy (HRT) to treat symptoms of menopause. Follow these instructions at home: Lifestyle  Do not use any products that contain nicotine or tobacco, such as cigarettes, e-cigarettes, and chewing tobacco. If you need help quitting, ask your health care provider.  Do not use street drugs.  Do not share needles.  Ask your health care provider for help if you need support or information about quitting drugs. Alcohol use  Do not drink alcohol if: ? Your health care provider tells you not to drink. ? You are pregnant, may be pregnant, or are planning to become pregnant.  If you drink alcohol: ? Limit how much you use to 0-1 drink a day. ? Limit intake if you are breastfeeding.  Be aware of how much alcohol is in your drink. In the [place] one drink equals one 12 oz bottle of beer (355 mL), one 5 oz glass of wine (148 mL), or one 1 oz glass of hard liquor (44 mL). General instructions  Schedule regular health, dental, and eye exams.  Stay current with your vaccines.  Tell your health care provider if: ? You often feel depressed. ? You have ever been abused or do not feel safe at home. Summary  Adopting a healthy lifestyle and getting preventive care are important in promoting health and wellness.  Follow your health care provider's instructions about healthy  diet, exercising, and getting tested or screened for diseases.  Follow your health care provider's instructions on monitoring your cholesterol and blood pressure. This information is not intended to replace advice given to you by your health care provider. Make sure you discuss any questions you have with your health care provider. Document Revised: 09/06/2018 Document Reviewed: 09/06/2018 Elsevier Patient Education  2021 [employer]  

## 2021-02-18 NOTE — Progress Notes (Signed)
Cassidy [name] 1980/10/04 [id number]   History:  40 y.o. G1P0 presents as new patient to establish care. Complaining of irregular menses and postcoital bleeding that is new for her. Prior to this change she had 24-day cycles with bleeding time of 3-5 days. For the last 4-5 months she has been having cycles sometimes twice per month with heavy bleeding with clots for first 1-2 days with total bleeding time of 5-7 days. She has also been having spotting in between menses and spotting after intercourse. Same sex partner and only uses digital penetration. Normal pap history. She is requesting hysterectomy. Unsuccessful IUD insertion attempt years ago due to curved cervical canal per patient. She also reports that she cannot have estrogen due to autoimmune disease?  Hypothyroidism managed by endocrinology - TSH in January was normal and she has appointment next month. History of autoimmune disease, and cardiac arrhythmia that is managed by cardiology, on Flecainide. Diagnostic mammogram 09/2020 showed stable benign right breast mass, likely fibroadenoma.  Gynecologic History Patient's last menstrual period was 02/11/2021. Period Duration (Days): 5 Period Pattern: (!) Irregular Menstrual Flow: Heavy Dysmenorrhea: (!) Mild Dysmenorrhea Symptoms: Cramping Contraception/Family planning: none, same sex partner  Health Maintenance Last Pap: 2018. Results were: normal Last mammogram: 09/30/2020. Results were: stable right breast mass, likely fibroadenoma. 6 month follow up recommended. Scheduled 02/25/2021. Last colonoscopy: 04/2020. Results were: 5 year recall Last Dexa: Not indicated  Past medical history, past surgical history, family history and social history were all reviewed and documented in the EPIC chart. Married. Works at [employer].   ROS:  A ROS was performed and pertinent positives and negatives are included.  Exam:  Vitals:   02/18/21 0838  BP: 118/78  Weight: 205 lb (93 kg)   Height: 5\' 3"  (1.6 m)   Body mass index is 36.31 kg/m.  General appearance:  Normal Thyroid:  Symmetrical, normal in size, without palpable masses or nodularity. Respiratory  Auscultation:  Clear without wheezing or rhonchi Cardiovascular  Auscultation:  Regular rate, without rubs, murmurs or gallops  Edema/varicosities:  Not grossly evident Abdominal  Soft, tender to LLQ, without masses, guarding or rebound.  Liver/spleen:  No organomegaly noted  Hernia:  None appreciated  Skin  Inspection:  Grossly normal Breasts: Examined lying and sitting.   Right: Without masses, retractions, nipple discharge or axillary adenopathy.   Left: Without masses, retractions, nipple discharge or axillary adenopathy. Genitourinary   Inguinal/mons:  Normal without inguinal adenopathy  External genitalia:  Normal appearing vulva with no masses, tenderness, or lesions  BUS/Urethra/Skene's glands:  Normal  Vagina:  Normal appearing with normal color and discharge, no lesions  Cervix:  Normal appearing without discharge or lesions  Uterus:  Normal in size, shape and contour.  Midline and mobile, nontender  Adnexa/parametria:     Rt: Normal in size, without masses or tenderness.   Lt: Normal in size, without masses or tenderness.  Anus and perineum: Normal  Assessment/Plan:  40 y.o. G1P0 for annual exam.   Well female exam with routine gynecological exam - Plan: Cytology - PAP( [facility]). Education provided on SBEs, importance of preventative screenings, current guidelines, high calcium diet, regular exercise, and multivitamin daily. Preventative labs done elsewhere.  Menorrhagia with irregular cycle - Plan: US PELVIS TRANSVAGINAL NON-OB (TV ONLY). Complaining of irregular menses x 4-5 months. Prior to this change she had 24-day cycles with bleeding time of 3-5 days. Since change, she has been having cycles sometimes twice per month with heavy bleeding with  clots for first 1-2 days with total  bleeding time of 5-7 days.  Postcoital bleeding - Plan: US PELVIS TRANSVAGINAL NON-OB (TV ONLY), GC/chlamydia. She reports spotting after intercourse. Bleeding typically occurs within 24 hours and lasts 1-2 days. Same sex partner and only uses digital penetration.   Breakthrough bleeding - Plan: US PELVIS TRANSVAGINAL NON-OB (TV ONLY). She has also been having spotting in between menses.  Screen for STD (sexually transmitted disease) - Plan: Cytology - PAP( [facility]). GC/Chlamydia added to pap.   Screening for cervical cancer - Normal Pap history.  Pap with HR HPV today.   Screening for breast cancer -  Diagnostic mammogram 09/2020 showed stable benign right breast mass, likely fibroadenoma. Scheduled for 10-month follow up 02/25/2021. Normal breast exam today.  Screening for colon cancer - 2021 colonoscopy. Will repeat at GI's recommended interval.   Return in 1 year for annual.   Tamela Gammon DNP, 9:13 AM 02/18/2021

## 2021-02-24 LAB — CYTOLOGY - PAP
Adequacy: ABSENT
Chlamydia: NEGATIVE
Comment: NEGATIVE
Comment: NEGATIVE
Comment: NORMAL
High risk HPV: NEGATIVE
Neisseria Gonorrhea: NEGATIVE

## 2021-02-25 ENCOUNTER — Other Ambulatory Visit: Payer: Self-pay

## 2021-02-25 ENCOUNTER — Ambulatory Visit
Admission: RE | Admit: 2021-02-25 | Discharge: 2021-02-25 | Disposition: A | Discharge status: Home / Self Care | Payer: BC Managed Care – PPO | Source: Ambulatory Visit | Attending: Internal Medicine | Admitting: Internal Medicine

## 2021-02-25 ENCOUNTER — Other Ambulatory Visit: Payer: Self-pay | Admitting: Internal Medicine

## 2021-02-25 ENCOUNTER — Ambulatory Visit: Payer: BC Managed Care – PPO

## 2021-02-25 DIAGNOSIS — N63 Unspecified lump in unspecified breast: Secondary | ICD-10-CM

## 2021-02-26 ENCOUNTER — Telehealth: Payer: Self-pay

## 2021-02-26 ENCOUNTER — Other Ambulatory Visit: Payer: Self-pay

## 2021-02-26 DIAGNOSIS — R87619 Unspecified abnormal cytological findings in specimens from cervix uteri: Secondary | ICD-10-CM

## 2021-02-26 NOTE — Telephone Encounter (Signed)
Patient called for Pap smear result.  Per Marny Lowenstein, NP result note "  Tamela Gammon, NP  02/25/2021 6:44 AM EDT Back to Top     Please let Isis know her pap came back showing atypical glandular cells and requires more follow up. Please schedule her for a colposcopy with Dr. Dellis Filbert. Also let her know her STD screening is negative. Thank you.    Patient was informed. Procedure explained. Staff message sent to [place] to call patient to schedule appointment.

## 2021-02-26 NOTE — Telephone Encounter (Signed)
Colpo appt scheduled 04/07/21 with Dr. Marguerita Merles.

## 2021-03-11 DIAGNOSIS — E039 Hypothyroidism, unspecified: Secondary | ICD-10-CM | POA: Diagnosis not present

## 2021-03-18 ENCOUNTER — Ambulatory Visit (INDEPENDENT_AMBULATORY_CARE_PROVIDER_SITE_OTHER): Payer: BC Managed Care – PPO

## 2021-03-18 ENCOUNTER — Other Ambulatory Visit: Payer: Self-pay

## 2021-03-18 ENCOUNTER — Ambulatory Visit: Payer: BC Managed Care – PPO | Admitting: Obstetrics & Gynecology

## 2021-03-18 VITALS — BP 134/82

## 2021-03-18 DIAGNOSIS — D219 Benign neoplasm of connective and other soft tissue, unspecified: Secondary | ICD-10-CM | POA: Diagnosis not present

## 2021-03-18 DIAGNOSIS — N921 Excessive and frequent menstruation with irregular cycle: Secondary | ICD-10-CM

## 2021-03-18 DIAGNOSIS — N840 Polyp of corpus uteri: Secondary | ICD-10-CM | POA: Diagnosis not present

## 2021-03-18 DIAGNOSIS — N930 Postcoital and contact bleeding: Secondary | ICD-10-CM | POA: Diagnosis not present

## 2021-03-18 DIAGNOSIS — E039 Hypothyroidism, unspecified: Secondary | ICD-10-CM | POA: Diagnosis not present

## 2021-03-18 DIAGNOSIS — R87619 Unspecified abnormal cytological findings in specimens from cervix uteri: Secondary | ICD-10-CM

## 2021-03-18 DIAGNOSIS — Z79899 Other long term (current) drug therapy: Secondary | ICD-10-CM

## 2021-03-18 DIAGNOSIS — D6861 Antiphospholipid syndrome: Secondary | ICD-10-CM

## 2021-03-18 DIAGNOSIS — E669 Obesity, unspecified: Secondary | ICD-10-CM | POA: Diagnosis not present

## 2021-03-18 NOTE — Progress Notes (Signed)
Cassidy [name] 11-11-1980 [id number]        40 y.o.  G1P0A1 Same sex partner accompanying.  RP: Menometrorrhagia for Pelvic US  HPI: Seen on 02/18/2021 by Marny Lowenstein NP here:  Complaining of irregular menses and postcoital bleeding that is new for her. Prior to this change she had 24-day cycles with bleeding time of 3-5 days. For the last 4-5 months she has been having cycles sometimes twice per month with heavy bleeding with clots for first 1-2 days with total bleeding time of 5-7 days. She has also been having spotting in between menses and spotting after intercourse. Same sex partner and only uses digital penetration. Normal pap history. She is requesting hysterectomy. Unsuccessful IUD insertion attempt years ago due to curved cervical canal per patient. She also reports that she cannot have estrogen due to autoimmune disease?  Hypothyroidism managed by endocrinology - TSH in January was normal and she has appointment next month. History of autoimmune disease, and cardiac arrhythmia that is managed by cardiology, on Flecainide.   OB History  Gravida Para Term Preterm AB Living  1         0  SAB IAB Ectopic Multiple Live Births               # Outcome Date GA Lbr Len/2nd Weight Sex Delivery Anes PTL Lv  1 Gravida         FD    Past medical history,surgical history, problem list, medications, allergies, family history and social history were all reviewed and documented in the EPIC chart.   Directed ROS with pertinent positives and negatives documented in the history of present illness/assessment and plan.  Exam:  Vitals:   03/18/21 1408  BP: 134/82   General appearance:  Normal  Pelvic US today: T/V images.  The uterus is anteverted with multiple fibroids noted, the largest measured at 2.7 cm.  The overall uterine size is measured at 10.47 x 6.73 x 6.56 cm.  The endometrial lining is measured at 10.27 mm.  A 9 mm x 5 mm lesion with a probable feeder vessel is noted at the  endometrium, compatible with an endometrial polyp.  Both ovaries are normal in size with normal follicular pattern time, a dominant follicle is present on the left ovary.  No adnexal mass seen.  No free fluid in the posterior cul-de-sac.   Assessment/Plan:  40 y.o. G1P0   1. Menometrorrhagia Pelvic ultrasound findings thoroughly reviewed with patient.  Uterine fibroids present, the largest measured at 2.7 cm.  Largest diameter of the uterus is at 10.47 cm.  Probable endometrial polyp.  Both ovaries normal.  Menometrorrhagia with postcoital bleeding.  Declines any hormonal therapy.  Previous failure to insert a progesterone IUD.  Contraindication to estrogen therapy due to antiphospholipid syndrome.  Dysrhythmia on flecainide, followed by cardio.  Patient requests surgical treatment.  Robotic total laparoscopic hysterectomy with bilateral salpingectomy and Sonata procedure reviewed.  Patient will inform herself on both procedures.  F/U Preop visit.  Pamphlets on Robotic TLH and Sonata given.  2. Postcoital bleeding As above.  Probably due to endometrial polyp.  3. Fibroids Fibroid uterus.  4. Endometrial polyp 9 mm x 5 mm lesion compatible with an endometrial polyp.  5. Atypical glandular cells of undetermined significance (AGUS) on cervical Pap smear F/U Colposcopy.  6. Antiphospholipid antibody syndrome ([place])  Other orders - Semaglutide-Weight Management (WEGOVY [place]); Inject into the skin. Will start medication 03/19/21. (Patient not taking: Reported on 03/18/2021)  Princess Bruins MD, 2:44 PM 03/18/2021

## 2021-03-20 ENCOUNTER — Other Ambulatory Visit (HOSPITAL_BASED_OUTPATIENT_CLINIC_OR_DEPARTMENT_OTHER): Payer: Self-pay

## 2021-03-20 ENCOUNTER — Telehealth: Payer: Self-pay

## 2021-03-20 NOTE — Telephone Encounter (Signed)
Patient in office for lab draw and she wanted to inform Dr. Harriet Masson she was started on a new medication for weight loss. Semaglutide (WEGOVY -[place])

## 2021-03-22 ENCOUNTER — Encounter: Payer: Self-pay | Admitting: Obstetrics & Gynecology

## 2021-03-25 LAB — FLECAINIDE LEVEL: Flecainide: 0.24 ug/mL (ref 0.20–1.00)

## 2021-03-27 ENCOUNTER — Other Ambulatory Visit: Payer: Self-pay

## 2021-03-27 ENCOUNTER — Ambulatory Visit
Admission: RE | Admit: 2021-03-27 | Discharge: 2021-03-27 | Disposition: A | Discharge status: Home / Self Care | Payer: BC Managed Care – PPO | Source: Ambulatory Visit | Attending: Internal Medicine | Admitting: Internal Medicine

## 2021-03-27 DIAGNOSIS — N63 Unspecified lump in unspecified breast: Secondary | ICD-10-CM

## 2021-03-27 DIAGNOSIS — N6312 Unspecified lump in the right breast, upper inner quadrant: Secondary | ICD-10-CM | POA: Diagnosis not present

## 2021-03-27 DIAGNOSIS — R922 Inconclusive mammogram: Secondary | ICD-10-CM | POA: Diagnosis not present

## 2021-04-07 ENCOUNTER — Encounter: Payer: Self-pay | Admitting: Obstetrics & Gynecology

## 2021-04-07 ENCOUNTER — Other Ambulatory Visit: Payer: Self-pay

## 2021-04-07 ENCOUNTER — Other Ambulatory Visit (HOSPITAL_COMMUNITY)
Admission: RE | Admit: 2021-04-07 | Discharge: 2021-04-07 | Disposition: A | Discharge status: Home / Self Care | Payer: BC Managed Care – PPO | Source: Ambulatory Visit | Attending: Obstetrics & Gynecology | Admitting: Obstetrics & Gynecology

## 2021-04-07 ENCOUNTER — Ambulatory Visit: Payer: BC Managed Care – PPO | Admitting: Obstetrics & Gynecology

## 2021-04-07 VITALS — BP 118/76 | Resp 16

## 2021-04-07 DIAGNOSIS — R87619 Unspecified abnormal cytological findings in specimens from cervix uteri: Secondary | ICD-10-CM | POA: Diagnosis not present

## 2021-04-07 DIAGNOSIS — D219 Benign neoplasm of connective and other soft tissue, unspecified: Secondary | ICD-10-CM

## 2021-04-07 DIAGNOSIS — N921 Excessive and frequent menstruation with irregular cycle: Secondary | ICD-10-CM

## 2021-04-07 DIAGNOSIS — N840 Polyp of corpus uteri: Secondary | ICD-10-CM

## 2021-04-07 NOTE — Progress Notes (Signed)
    Cassidy [name] 18-Mar-1981 [id number]        40 y.o.  G1P0 Same sex partner present.  RP: AGUS/HPV HR Neg for Colpo  HPI: AGUS/HPV HR Neg 01/2021.  STI screen Neg.  Endometrial Polyp/Fibroids with Menometrorrhagia for which patient opts for XI Robotic TLH/Bilateral Salpingectomy.  Will obtain Cardio clearance.   OB History  Gravida Para Term Preterm AB Living  1         0  SAB IAB Ectopic Multiple Live Births               # Outcome Date GA Lbr Len/2nd Weight Sex Delivery Anes PTL Lv  1 Gravida         FD    Past medical history,surgical history, problem list, medications, allergies, family history and social history were all reviewed and documented in the EPIC chart.   Directed ROS with pertinent positives and negatives documented in the history of present illness/assessment and plan.  Exam:  Vitals:   04/07/21 1008  BP: 118/76  Resp: 16   General appearance:  Normal  Colposcopy Procedure Note [facility]CKENNA GAMM 04/07/2021  Indications: AGUS/HPV HR Negative  Procedure Details  The risks and benefits of the procedure and Written informed consent obtained.  Speculum placed in vagina and excellent visualization of cervix achieved, cervix swabbed x 3 with acetic acid solution.  Findings:  Cervix colposcopy: Physical Exam Genitourinary:       Vaginal colposcopy: Normal  Vulvar colposcopy: Normal  Perirectal colposcopy: Normal  The cervix was sprayed with Hurricane before performing the cervical biopsies.  Specimens: Cervical Bxs at 11 and 6 O'Clock  Complications: None, good hemostasis with Silver Nitrate . Plan: Management per cervical Bx results   Assessment/Plan:  40 y.o. G1P0   1. Atypical glandular cells on cervical Pap smear Atypical glandular cells on cervical Pap test May 2022.  Significance reviewed with patient.  Colposcopy procedure explained.  Colposcopy findings reviewed.  Will manage per cervical biopsy results.  Postprocedure precautions  reviewed. - Colposcopy - Surgical pathology( [facility]/ [facility])  2. Menometrorrhagia Menometrorrhagia with uterine fibroids and endometrial polyp.  Patient opts for XI robotic TLH with bilateral salpingectomy.  Will obtain cardiology clearance.  Follow-up preop.  3. Fibroids As above.  4. Endometrial polyp As above.  Other orders - SAXENDA 18 MG/3ML SOPN; Inject into the skin. (Patient not taking: Reported on 04/07/2021) - BD PEN NEEDLE NANO 2ND GEN 32G X 4 MM MISC - UNABLE TO FIND; Med Name: migraines med   Princess Bruins MD, 10:16 AM 04/07/2021

## 2021-04-08 ENCOUNTER — Telehealth: Payer: Self-pay | Admitting: *Deleted

## 2021-04-08 LAB — SURGICAL PATHOLOGY

## 2021-04-08 NOTE — Telephone Encounter (Signed)
-----   Message from Princess Bruins, MD sent at 04/07/2021 10:43 AM EDT ----- Regarding: Schedule surgery Surgery:  XI Robotic Total Laparoscopic Surgery with Bilateral Salpingectomy  Diagnosis:  Menometrorrhagia/Fibroids/Endometrial Polyp  Location: [facility]  Status: Outpatient  Time: 120 Minutes  Assistant: First Available Provider  Urgency: First Available  Pre-Op Appointment: To Be Scheduled  Post-Op Appointment(s): 2 Weeks, 6 Weeks  Time Out Of Work: 6 Weeks

## 2021-04-09 ENCOUNTER — Telehealth: Payer: Self-pay | Admitting: Cardiology

## 2021-04-09 NOTE — Telephone Encounter (Signed)
    Patient Name: Cassidy [name]  DOB: 06-11-1981 MRN: [id number]  Primary Cardiologist: Berniece Salines, DO  Chart reviewed as part of pre-operative protocol coverage.   Left a voicemail for patient to call back for ongoing preop assessment.  Abigail Butts, PA-C 04/09/2021, 12:09 PM

## 2021-04-09 NOTE — Telephone Encounter (Signed)
Spoke with patient.  Reviewed surgery dates. Patient will return call after discussing with spouse. Will need cardiac clearance. Has appt with Dr. Harriet Masson at [facility] on 05/01/21. Advised I will reach out to cardiology to obtain cardiac clearance, their office may f/u with additional instructions. Patient verbalizes understanding and thankful for call.    Call placed to [facility]. Spoke with Caryl Pina.  Cardiac Clearance requested. Will be notified once completed.

## 2021-04-09 NOTE — Telephone Encounter (Signed)
   [facility] Pre-operative Risk Assessment    Patient Name: Cassidy [name]  DOB: August 10, 1981 MRN: [id number]  HEARTCARE STAFF:  - IMPORTANT!!!!!! Under Visit Info/Reason for Call, type in Other and utilize the format Clearance MM/DD/YY or Clearance TBD. Do not use dashes or single digits. - Please review there is not already an duplicate clearance open for this procedure. - If request is for dental extraction, please clarify the # of teeth to be extracted. - If the patient is currently at the dentist's office, call Pre-Op Callback Staff (MA/nurse) to input urgent request.  - If the patient is not currently in the dentist office, please route to the Pre-Op pool.  Request for surgical clearance:  What type of surgery is being performed? Robotic Total Laparoscopic Hysterectomy with Bilateral Salpingectomy  When is this surgery scheduled? 05/19/21  What type of clearance is required (medical clearance vs. Pharmacy clearance to hold med vs. Both)? Medical   Are there any medications that need to be held prior to surgery and how long? No   Practice name and name of physician performing surgery? [facility], Dr. Dellis Filbert   What is the office phone number?   [phone]  7.   What is the office fax number? [phone]  8.   Anesthesia type (None, local, MAC, general) ? General    Trilby Drummer 04/09/2021, 10:15 AM  _________________________________________________________________   (provider comments below)

## 2021-04-09 NOTE — Telephone Encounter (Signed)
Left message to call Anallely Rosell, RN at [facility], [phone].  

## 2021-04-13 NOTE — Telephone Encounter (Signed)
I will send notes to surgeon's office as to clarify procedure with the pt. Per clearance request stated: Robotic Total Laparoscopic Hysterectomy with Bilateral Salpingectomy. Pt stated to the pre op provider today she is not having a TOTAL Hysterectomy but is having a PARTIAL Hysterectomy. I feel this should be address with her surgeon's office for clarification. Pt has appt with Dr. Harriet Masson 05/01/21, which clearance will be addressed at that time. I will forward notes to Dr. Harriet Masson for upcoming appt. I will send notes to Dr. Dellis Filbert as well that pt has appt and to address the need for clarification for procedure being done.

## 2021-04-13 NOTE — Telephone Encounter (Signed)
Patient contacted as part of preoperative surgical protocol.  She reports that she is not having total hysterectomy with bilateral salpingo-oophorectomy, which is the listed procedure on the cardiac clearance request form.  She reports that she continues to have mild dizziness.  She is functional and continues to work.  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  Please send request back to requesting office for clarification on procedure.  She does have follow-up visit scheduled with Dr. Harriet Masson in early August.  We will defer cardiac clearance to her at that time.  Jossie Ng. [place] Name NP-C    04/13/2021, 10:27 AM [place] [address] Office [phone] Fax [phone]

## 2021-04-13 NOTE — Telephone Encounter (Signed)
Pt is calling back

## 2021-04-14 NOTE — Telephone Encounter (Signed)
Cassidy [name] from [facility] calling to clarify per patient's request. She states the patient is having a Robotic Laparoscopic Hysterectomy with Bilateral Salpingectomy. She states she thinks the confusion may be that the patient wants to make sure her ovaries will still be there. If there are any other questions to contact the office back.

## 2021-04-14 NOTE — Telephone Encounter (Signed)
Spoke with patient.  Reviewed surgery dates, patient request to proceed with surgery on 07/29/21. Patient is aware she will need surgical clearance to proceed with surgery once scheduled and is agreeable.   Patient states cardiology would not provide clearance until type of surgery was clear and concerns addressed with Dr. Dellis Filbert.  Patient adamant that ovaries remain.   Advised patient Dr. Dellis Filbert discussed with her a Robotic Total Hysterectomy with bilateral salpingectomy. Advised this is the removal of cervix, uterus and both tubes. Ovaries will remain. Strongly encouraged OV/MyChart visit with Dr. Dellis Filbert if she has any questions regarding the type of surgery that she is planning to proceed with scheduling. Patient declined and states she understands and will further discuss at her pre-op. She will call if she desires OV or any additional questions.   Advised patient I will reach out Cardiology to confirm surgery and date. Patient currently scheduled to see cardiology on 05/01/21. I will return call once surgery date/time confirmed. Will forward to business office.    Call placed to [facility], spoke with Selena. Advised calling per patient request to confirm surgery planning for Robotic Total Laparoscopic Hysterectomy with bilateral salpingectomy. Please return call to office if any additional questions.   Routing to Dr. Ileene Musa.

## 2021-04-15 ENCOUNTER — Encounter: Payer: Self-pay | Admitting: Obstetrics & Gynecology

## 2021-04-15 NOTE — Telephone Encounter (Addendum)
   Patient Name: Cassidy [name]  DOB: Jul 21, 1981 MRN: [id number]  Primary Cardiologist: Berniece Salines, DO  Chart reviewed as part of pre-operative protocol coverage.   Please see initial note for request from 04/09/21.  Per the [facility], she is having a Robotic Laparoscopy Hysterectomy with bilateral salpingectomy on 05/19/21.  She reported mild dizziness on review of her chart. Per Coletta Memos, NP: "She reports that she continues to have mild dizziness.  She is functional and continues to work.  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 Harriet Masson 05/01/21.   She should keep this visit in case further procedures needed at that time.   She is not on any OAC or antiplatelet therapy at this time.   I will update her appointment visit notes to reflect her need for preoperative review at the time of her 05/01/21 visit.   I will send this request to Dr. Harriet Masson and also route back to the preoperative callback pool as an FYI, as I am removing it from the preoperative pool for now given the scheduled appointment.   Arvil Chaco, PA-C 04/15/2021, 9:23 AM

## 2021-04-16 NOTE — Telephone Encounter (Signed)
Surgery request sent.   Routing to [facility].

## 2021-05-01 ENCOUNTER — Other Ambulatory Visit: Payer: Self-pay

## 2021-05-01 ENCOUNTER — Encounter: Payer: Self-pay | Admitting: Cardiology

## 2021-05-01 ENCOUNTER — Ambulatory Visit: Payer: BC Managed Care – PPO | Admitting: Cardiology

## 2021-05-01 VITALS — BP 124/82 | HR 85 | Ht 63.0 in | Wt 214.0 lb

## 2021-05-01 DIAGNOSIS — E669 Obesity, unspecified: Secondary | ICD-10-CM

## 2021-05-01 DIAGNOSIS — Z7689 Persons encountering health services in other specified circumstances: Secondary | ICD-10-CM

## 2021-05-01 DIAGNOSIS — D6861 Antiphospholipid syndrome: Secondary | ICD-10-CM

## 2021-05-01 DIAGNOSIS — I471 Supraventricular tachycardia: Secondary | ICD-10-CM

## 2021-05-01 DIAGNOSIS — R42 Dizziness and giddiness: Secondary | ICD-10-CM

## 2021-05-01 DIAGNOSIS — E782 Mixed hyperlipidemia: Secondary | ICD-10-CM

## 2021-05-01 NOTE — Addendum Note (Signed)
Addended by: Darrel Reach on: 05/01/2021 12:00 PM   Modules accepted: Orders

## 2021-05-01 NOTE — Progress Notes (Signed)
Cardiology Office Note:    Date:  05/01/2021   ID:  Cassidy [name], DOB 1981-01-09, MRN [id number]  PCP:  Nicolette Bang, MD  Cardiologist:  Berniece Salines, DO  Electrophysiologist:  None   Referring MD: Caryl Never*   Overall mild palpitations improvement has some dizziness.   History of Present Illness:    Cassidy [name] is a 40 y.o. female with a hx of antiphospholipid syndrome not on anticoagulation plans to see hematology today, PVCs, history of SVT and hyperlipidemia is here today for follow-up visit.   Of note the patient was diagnosed with antiphospholipid syndrome after she had a stillbirth and was tested.  She was recommended Coumadin at that time she declined and had not been on any anticoagulation and had not follow-up with hematology. She does have hypothyroidism and she sees endocrine for this.   I did see the patient on October 24, 2020 for the first time when she reported she had been experiencing significant palpitations and fluttering.  Thankfully she had not passed out but the fluttering was significant and she was concerned about this.  At the end of the visit I placed a monitor and recommend an echocardiogram.  She was able to get his testing done in the interim.   I did see the patient in November 13, 2020 at that time we discussed her monitor results which showed paroxysmal atrial tachycardia along with symptomatic PACs.  I started patient on metoprolol and she tells me that her symptoms were worse on metoprolol so stopped this medication.  I started patient on propanolol 20 mg twice a day, she did call reported that she was experiencing some diarrhea and stomach aches and pains on the propanolol recommended during the time that we switch her to Cardizem but she declined reporting that she has significant bad reaction with Cardizem which made her significantly tired.     I saw the patient on November 28, 2020 at that time she was still experiencing  palpitations we will place the patient on flecainide 50 mg twice a day and continue her propanolol 10 mg daily.  I saw the patient on December 18, 2020 at that time she had improvement on her flecainide.  She did have some dizziness I referred her to ENT which she had not had any appointments yet.  We also send the patient information to be established with a primary care doctor during that visit.  She has not established yet.  She is planning on laparoscopic hysterectomy which is scheduled for July 29, 2021.  Past Medical History:  Diagnosis Date   Acquired hypothyroidism    Antiphospholipid antibody syndrome (HCC)    Autoimmune disorder (HCC)    GERD (gastroesophageal reflux disease)    Mixed hyperlipidemia    Morbid obesity (HCC)    PAT (paroxysmal atrial tachycardia) (HCC)    Restless leg syndrome    Tachycardia     Past Surgical History:  Procedure Laterality Date   TONSILLECTOMY      Current Medications: Current Meds  Medication Sig   BD PEN NEEDLE NANO 2ND GEN 32G X 4 MM MISC Inject 1 Syringe into the skin See admin instructions. To be used with Saxenda   butalbital-acetaminophen-caffeine (FIORICET) 50-325-40 MG tablet Take 1 tablet by mouth 2 (two) times daily as needed for migraine.   flecainide (TAMBOCOR) 100 MG tablet Take 1 tablet (100 mg total) by mouth 2 (two) times daily.   levothyroxine (SYNTHROID) 175 MCG tablet Take 175  mcg by mouth daily before breakfast.   Melatonin 3 MG CAPS Take 3 mg by mouth at bedtime.   propranolol (INDERAL) 10 MG tablet Take 1 tablet (10 mg total) by mouth daily.   SAXENDA 18 MG/3ML SOPN Inject 18 mg into the skin daily.     Allergies:   Statins, Benadryl [diphenhydramine], Ciprocinonide [fluocinolone], Ciprofloxacin, Nsaids, Tolmetin, and Aspirin   Social History   Socioeconomic History   Marital status: Married    Spouse name: Not on file   Number of children: Not on file   Years of education: Not on file   Highest education  level: Not on file  Occupational History   Not on file  Tobacco Use   Smoking status: Some Days    Packs/day: 1.00    Types: Cigarettes   Smokeless tobacco: Never   Tobacco comments:    Non tabacco nicotine pouch  Vaping Use   Vaping Use: Every day  Substance and Sexual Activity   Alcohol use: Yes    Comment: social   Drug use: No   Sexual activity: Yes    Partners: Female    Birth control/protection: None  Other Topics Concern   Not on file  Social History Narrative   Not on file   Social Determinants of Health   Financial Resource Strain: Not on file  Food Insecurity: Not on file  Transportation Needs: Not on file  Physical Activity: Not on file  Stress: Not on file  Social Connections: Not on file     Family History: The patient's family history includes Atrial fibrillation in her mother; Breast cancer in her maternal aunt and maternal grandmother; Healthy in her brother; Hyperlipidemia in her father and mother; Supraventricular tachycardia in her mother.  ROS:   Review of Systems  Constitution: Negative for decreased appetite, fever and weight gain.  HENT: Negative for congestion, ear discharge, hoarse voice and sore throat.   Eyes: Negative for discharge, redness, vision loss in right eye and visual halos.  Cardiovascular: Negative for chest pain, dyspnea on exertion, leg swelling, orthopnea and palpitations.  Respiratory: Negative for cough, hemoptysis, shortness of breath and snoring.   Endocrine: Negative for heat intolerance and polyphagia.  Hematologic/Lymphatic: Negative for bleeding problem. Does not bruise/bleed easily.  Skin: Negative for flushing, nail changes, rash and suspicious lesions.  Musculoskeletal: Negative for arthritis, joint pain, muscle cramps, myalgias, neck pain and stiffness.  Gastrointestinal: Negative for abdominal pain, bowel incontinence, diarrhea and excessive appetite.  Genitourinary: Negative for decreased libido, genital sores  and incomplete emptying.  Neurological: Negative for brief paralysis, focal weakness, headaches and loss of balance.  Psychiatric/Behavioral: Negative for altered mental status, depression and suicidal ideas.  Allergic/Immunologic: Negative for HIV exposure and persistent infections.    EKGs/Labs/Other Studies Reviewed:    The following studies were reviewed today:   EKG: Sinus rhythm, heart rate 80 bpm.  November 04, 2020 Transthoracic echocardiogram IMPRESSIONS   1. Left ventricular ejection fraction, by estimation, is 60 to 65%. The left ventricle has normal function. The left ventricle has no regional wall motion abnormalities. Left ventricular diastolic parameters were normal.   2. Right ventricular systolic function is normal. The right ventricular size is normal. There is normal pulmonary artery systolic pressure. The estimated right ventricular systolic pressure is [id number] mmHg.   3. The mitral valve is grossly normal. Trivial mitral valve regurgitation. No evidence of mitral stenosis.   4. The aortic valve is tricuspid. Aortic valve regurgitation is not visualized. No aortic  stenosis is present.   5. The inferior vena cava is normal in size with greater than 50% respiratory variability, suggesting right atrial pressure of 3 mmHg.     The patient wore the monitor for 12 days 22 hours hours starting October 16, 2020. Indication: Palpitations   The minimum heart rate was 43 bpm, maximum heart rate was 169 bpm, and average heart rate was 81 bpm. Predominant underlying rhythm was Sinus Rhythm.    54 Supraventricular Tachycardia runs occurred, the run with the fastest interval lasting 8 beats with a maximum rate of 169 bpm (average 54 bpm); the run with the fastest interval was also the longest.   Premature atrial complexes were occasional (4.0%,57584). Premature Ventricular complexes were rare less than 1%.   No ventricular tachycardia, no pauses, No AV block and no atrial  fibrillation present. 97 patient triggered events: One associated with supraventricular tachycardia and the remaining associated with premature atrial complexes sinus rhythm.   Conclusion: This study is remarkable for the following:                                  1. Symptomatic occasional premature atrial complex.                                  2. Supraventricular tachycardia which is likely atrial tachycardia with variable block.    Recent Labs: 10/09/2020: TSH 2.376 11/13/2020: ALT 10; Hemoglobin 12.5; Platelet Count 285 12/18/2020: BUN 11; Creatinine, Ser 0.85; Magnesium 2.3; Potassium 4.3; Sodium 134  Recent Lipid Panel    Component Value Date/Time   CHOL 214 (H) 02/28/2020 0907   TRIG 96 02/28/2020 0907   HDL 58 02/28/2020 0907   CHOLHDL 3.7 02/28/2020 0907   LDLCALC 139 (H) 02/28/2020 0907    Physical Exam:    VS:  BP 124/82   Pulse 85   Ht '5\' 3"'$  (1.6 m)   Wt 214 lb 0.6 oz (97.1 kg)   BMI 37.92 kg/m     Wt Readings from Last 3 Encounters:  05/01/21 214 lb 0.6 oz (97.1 kg)  02/18/21 205 lb (93 kg)  12/18/20 205 lb (93 kg)     GEN: Well nourished, well developed in no acute distress HEENT: Normal NECK: No JVD; No carotid bruits LYMPHATICS: No lymphadenopathy CARDIAC: S1S2 noted,RRR, no murmurs, rubs, gallops RESPIRATORY:  Clear to auscultation without rales, wheezing or rhonchi  ABDOMEN: Soft, non-tender, non-distended, +bowel sounds, no guarding. EXTREMITIES: No edema, No cyanosis, no clubbing MUSCULOSKELETAL:  No deformity  SKIN: Warm and dry NEUROLOGIC:  Alert and oriented x 3, non-focal PSYCHIATRIC:  Normal affect, good insight  ASSESSMENT:    1. Dizziness   2. PSVT (paroxysmal supraventricular tachycardia) ([place])   3. Encounter to establish care   4. PAT (paroxysmal atrial tachycardia) ([place])   5. Antiphospholipid antibody syndrome (HCC)   6. Obesity (BMI 30-39.9)   7. Mixed hyperlipidemia    PLAN:    She has not had any palpitations since I saw  the patient.  She has had more dizziness.  Unfortunately she has not been able to see ENT.  Going to her refer her again to ENT.  She also still needs to establish with a new primary care and we will get a singes referral again.  Continue her flecainide and propanolol.  Her antiphospholipid syndrome she follows with hematology.  She is not being anticoagulated at this time.  The patient understands the need to lose weight with diet and exercise. We have discussed specific strategies for this.  The patient does not have any unstable cardiac conditions.  Upon evaluation today, she can achieve 4 METs or greater without anginal symptoms.  According to [facility] and AHA guidelines, she requires no further cardiac workup prior to her noncardiac surgery and should be at acceptable risk.  Our service is available as necessary in the perioperative period.  The patient is in agreement with the above plan. The patient left the office in stable condition.  The patient will follow up in 1 year or sooner if needed.   Medication Adjustments/Labs and Tests Ordered: Current medicines are reviewed at length with the patient today.  Concerns regarding medicines are outlined above.  Orders Placed This Encounter  Procedures   Ambulatory referral to ENT   Ambulatory referral to [facility]   No orders of the defined types were placed in this encounter.   Patient Instructions  Medication Instructions:  Your physician recommends that you continue on your current medications as directed. Please refer to the Current Medication list given to you today.  *If you need a refill on your cardiac medications before your next appointment, please call your pharmacy*   Lab Work: None If you have labs (blood work) drawn today and your tests are completely normal, you will receive your results only by: [place] (if you have MyChart) OR A paper copy in the mail If you have any lab test that is abnormal or we need  to change your treatment, we will call you to review the results.   Testing/Procedures: None   Follow-Up: At [facility], you and your health needs are our priority.  As part of our continuing mission to provide you with exceptional heart care, we have created designated Provider Care Teams.  These Care Teams include your primary Cardiologist (physician) and Advanced Practice Providers (APPs -  Physician Assistants and Nurse Practitioners) who all work together to provide you with the care you need, when you need it.  We recommend signing up for the patient portal called "MyChart".  Sign up information is provided on this After Visit Summary.  MyChart is used to connect with patients for Virtual Visits (Telemedicine).  Patients are able to view lab/test results, encounter notes, upcoming appointments, etc.  Non-urgent messages can be sent to your provider as well.   To learn more about what you can do with MyChart, go to [email or web address].    Your next appointment:   1 year(s)  The format for your next appointment:   In Person  Provider:   [place], DO [address], [place], [place] [address]    Other Instructions    Adopting a Healthy Lifestyle.  Know what a healthy weight is for you (roughly BMI <25) and aim to maintain this   Aim for 7+ servings of fruits and vegetables daily   65-80+ fluid ounces of water or unsweet tea for healthy kidneys   Limit to max 1 drink of alcohol per day; avoid smoking/tobacco   Limit animal fats in diet for cholesterol and heart health - choose grass fed whenever available   Avoid highly processed foods, and foods high in saturated/trans fats   Aim for low stress - take time to unwind and care for your mental health   Aim for 150 min of moderate intensity exercise weekly for  heart health, and weights twice weekly for bone health   Aim for 7-9 hours of sleep daily   When it comes to diets, agreement about  the perfect plan isnt easy to find, even among the experts. Experts at the [employer] developed an idea known as the Healthy Eating Plate. Just imagine a plate divided into logical, healthy portions.   The emphasis is on diet quality:   Load up on vegetables and fruits - one-half of your plate: Aim for color and variety, and remember that potatoes dont count.   Go for whole grains - one-quarter of your plate: Whole wheat, barley, wheat berries, quinoa, oats, brown rice, and foods made with them. If you want pasta, go with whole wheat pasta.   Protein power - one-quarter of your plate: Fish, chicken, beans, and nuts are all healthy, versatile protein sources. Limit red meat.   The diet, however, does go beyond the plate, offering a few other suggestions.   Use healthy plant oils, such as olive, canola, soy, corn, sunflower and peanut. Check the labels, and avoid partially hydrogenated oil, which have unhealthy trans fats.   If youre thirsty, drink water. Coffee and tea are good in moderation, but skip sugary drinks and limit milk and dairy products to one or two daily servings.   The type of carbohydrate in the diet is more important than the amount. Some sources of carbohydrates, such as vegetables, fruits, whole grains, and beans-are healthier than others.   Finally, stay active  Signed, Berniece Salines, DO  05/01/2021 8:59 AM    [place]

## 2021-05-01 NOTE — Patient Instructions (Signed)
Medication Instructions:  Your physician recommends that you continue on your current medications as directed. Please refer to the Current Medication list given to you today.  *If you need a refill on your cardiac medications before your next appointment, please call your pharmacy*   Lab Work: None If you have labs (blood work) drawn today and your tests are completely normal, you will receive your results only by: [place] (if you have MyChart) OR A paper copy in the mail If you have any lab test that is abnormal or we need to change your treatment, we will call you to review the results.   Testing/Procedures: None   Follow-Up: At [facility], you and your health needs are our priority.  As part of our continuing mission to provide you with exceptional heart care, we have created designated Provider Care Teams.  These Care Teams include your primary Cardiologist (physician) and Advanced Practice Providers (APPs -  Physician Assistants and Nurse Practitioners) who all work together to provide you with the care you need, when you need it.  We recommend signing up for the patient portal called "MyChart".  Sign up information is provided on this After Visit Summary.  MyChart is used to connect with patients for Virtual Visits (Telemedicine).  Patients are able to view lab/test results, encounter notes, upcoming appointments, etc.  Non-urgent messages can be sent to your provider as well.   To learn more about what you can do with MyChart, go to [email or web address].    Your next appointment:   1 year(s)  The format for your next appointment:   In Person  Provider:   [place], DO [address], [place], [place] [address]    Other Instructions

## 2021-05-18 NOTE — Telephone Encounter (Signed)
Routing to [employer].

## 2021-05-19 ENCOUNTER — Other Ambulatory Visit: Payer: Self-pay | Admitting: *Deleted

## 2021-05-19 ENCOUNTER — Other Ambulatory Visit (HOSPITAL_BASED_OUTPATIENT_CLINIC_OR_DEPARTMENT_OTHER): Payer: Self-pay | Admitting: Obstetrics & Gynecology

## 2021-05-19 DIAGNOSIS — M1712 Unilateral primary osteoarthritis, left knee: Secondary | ICD-10-CM | POA: Diagnosis not present

## 2021-05-19 DIAGNOSIS — M25562 Pain in left knee: Secondary | ICD-10-CM | POA: Diagnosis not present

## 2021-05-19 DIAGNOSIS — M79672 Pain in left foot: Secondary | ICD-10-CM | POA: Diagnosis not present

## 2021-05-19 NOTE — Progress Notes (Signed)
No order placed. KW CMA

## 2021-05-19 NOTE — Progress Notes (Signed)
Error - no order placed KW CMA

## 2021-05-25 ENCOUNTER — Telehealth: Payer: Self-pay | Admitting: Cardiology

## 2021-05-25 NOTE — Telephone Encounter (Signed)
   [facility] Pre-operative Risk Assessment    Patient Name: Cassidy [name]  DOB: 04-Aug-1981 MRN: [id number]  HEARTCARE STAFF:  - IMPORTANT!!!!!! Under Visit Info/Reason for Call, type in Other and utilize the format Clearance MM/DD/YY or Clearance TBD. Do not use dashes or single digits. - Please review there is not already an duplicate clearance open for this procedure. - If request is for dental extraction, please clarify the # of teeth to be extracted. - If the patient is currently at the dentist's office, call Pre-Op Callback Staff (MA/nurse) to input urgent request.  - If the patient is not currently in the dentist office, please route to the Pre-Op pool.  Request for surgical clearance:  What type of surgery is being performed? Robotic Total Laparoscopic Hysterectomy with bilateral salpingectomy  When is this surgery scheduled? 07/29/21  What type of clearance is required (medical clearance vs. Pharmacy clearance to hold med vs. Both)? Medical   Are there any medications that need to be held prior to surgery and how long? TBD   Practice name and name of physician performing surgery? [facility], Dr. Dellis Filbert   What is the office phone number? [phone]   7.   What is the office fax number? [phone]  8.   Anesthesia type (None, local, MAC, general) ? [place] 05/25/2021, 1:33 PM  _________________________________________________________________   (provider comments below)

## 2021-05-25 NOTE — Telephone Encounter (Signed)
Per review of Epic, patient was seen by cardiology on 05/01/21.   Call placed to [facility] at [phone], spoke with Caryl Pina. Surgical clearance requested. Patient is scheduled for Robotic Total Laparoscopic Hysterectomy with Bilateral salpingectomy on 07/29/21. Request will be forwarded for f/u.   Call placed to patient, update provided.  Pre-op scheduled for 07/10/21 at 1345.   Routing to [employer].

## 2021-05-25 NOTE — Telephone Encounter (Addendum)
Cardiac Clearance received. See telephone encounter dated 05/25/21 in Epic.  Routing to Dr. Ileene Musa.  [facility] PAT notified.

## 2021-05-25 NOTE — Telephone Encounter (Signed)
    Patient Name: Cassidy [name]  DOB: 10-03-1980 MRN: [id number]  Primary Cardiologist: Berniece Salines, DO  Chart reviewed as part of pre-operative protocol coverage. Patient was recently seen by Dr. Harriet Masson on 05/01/2021 at which time she mentioned need for upcoming hysterectomy in 07/2021. Per Dr. Terrial Rhodes note, "The patient does not have any unstable cardiac conditions.  Upon evaluation today, she can achieve 4 METs or greater without anginal symptoms.  According to [facility] and AHA guidelines, she requires no further cardiac workup prior to her noncardiac surgery and should be at acceptable risk.  Our service is available as necessary in the perioperative period."  I will route this recommendation to the requesting party via [place] fax function and remove from pre-op pool.  Please call with questions.  Darreld Mclean, PA-C 05/25/2021, 1:41 PM

## 2021-05-26 NOTE — Telephone Encounter (Signed)
Spoke with patient regarding surgery benefits. Patient acknowledges understanding of information presented. Patient is aware that benefits presented are professional benefits only. Patient is aware the hospital will call with separate benefits. See account note.  Routing to Glorianne [place], Therapist, sports, for surgery scheduling.

## 2021-05-28 NOTE — Telephone Encounter (Signed)
Left message to call Charlane Westry, RN at [facility], [phone].  

## 2021-06-04 ENCOUNTER — Other Ambulatory Visit: Payer: Self-pay

## 2021-06-04 ENCOUNTER — Ambulatory Visit
Admission: EM | Admit: 2021-06-04 | Discharge: 2021-06-04 | Disposition: A | Discharge status: Home / Self Care | Payer: BC Managed Care – PPO | Attending: Emergency Medicine | Admitting: Emergency Medicine

## 2021-06-04 ENCOUNTER — Telehealth: Payer: Self-pay | Admitting: Emergency Medicine

## 2021-06-04 ENCOUNTER — Emergency Department: Admit: 2021-06-04 | Discharge status: Absent without leave | Payer: Self-pay

## 2021-06-04 ENCOUNTER — Telehealth: Payer: Self-pay | Admitting: *Deleted

## 2021-06-04 ENCOUNTER — Ambulatory Visit (INDEPENDENT_AMBULATORY_CARE_PROVIDER_SITE_OTHER): Payer: BC Managed Care – PPO

## 2021-06-04 DIAGNOSIS — R1033 Periumbilical pain: Secondary | ICD-10-CM

## 2021-06-04 DIAGNOSIS — L0882 Omphalitis not of newborn: Secondary | ICD-10-CM

## 2021-06-04 DIAGNOSIS — R198 Other specified symptoms and signs involving the digestive system and abdomen: Secondary | ICD-10-CM | POA: Diagnosis not present

## 2021-06-04 DIAGNOSIS — S31105A Unspecified open wound of abdominal wall, periumbilic region without penetration into peritoneal cavity, initial encounter: Secondary | ICD-10-CM

## 2021-06-04 DIAGNOSIS — R58 Hemorrhage, not elsewhere classified: Secondary | ICD-10-CM | POA: Diagnosis not present

## 2021-06-04 MED ORDER — CEPHALEXIN 500 MG PO CAPS: 500.0000 mg | ORAL_CAPSULE | Freq: Four times a day (QID) | ORAL | 0 refills | Status: DC

## 2021-06-04 MED ORDER — FLUCONAZOLE 200 MG PO TABS: 200.0000 mg | ORAL_TABLET | Freq: Every day | ORAL | 0 refills | Status: AC

## 2021-06-04 NOTE — Telephone Encounter (Signed)
Patient called she was told by Morley Kos at [facility] that she was unable to order a CT and she should go to her GYN, the ED or here. As per [place] the [facility] will not pay for another visit today. I messaged the PA and advised her that she can order the CT.Called the patient and advised her to wait for a call.

## 2021-06-04 NOTE — ED Triage Notes (Signed)
Patient presents to Urgent Care with complaints of umbilical bleeding. She reports first episode happened 2 weeks ago and again today at 4am this morning. She states she noted reddish and brownish discharge. Pt states the area feels like "her skin is ripping and increases with movement." Pt states she has a hx of umbilical infection that happened 2 years ago. She states this feels different.   Denies fever.

## 2021-06-04 NOTE — Telephone Encounter (Addendum)
I spoke with Dr.Lavoie about patient and she told me she does not want to order CT scan, she will see patient early next week. Patient has antibiotic, which will help if possible infection. I called patient however her voicemail is not set up to relay the above.    Spoke with [place] and patient will be seen on 06/08/21 @ 10:00am. Patient aware.

## 2021-06-04 NOTE — Telephone Encounter (Signed)
Patient called was seen at [facility] urgent Care for umbilicus bleeding today, reports they ordered a X-ray which was normal,but suggested Ct scan abdomen/ pelvis with contrast to rule out abscess.( Office notes and x-ray in epic) Urgent care did prescribe antibiotic for her. Patient has not seen her PCP in over 1 year, reports they don't pick up the phones. She is scheduled for LAPAROSCOPIC BILATERAL SALPINGECTOMY on 07/29/21. Patient asked if you would order CT scan at outpatient imaging center based on notes/x-ray results? Please advise

## 2021-06-04 NOTE — ED Provider Notes (Signed)
[facility]    CSN: [id number] Arrival date & time: 06/04/21  0856      History   Chief Complaint Chief Complaint  Patient presents with   Umbilical bleeding     HPI Cassidy [name] is a 40 y.o. female.   Pt here for umbilicus area bleeding and draining with pain this am . She took a saxenda shot for wt loss approx 6 weeks ago and then noticed abd pain around umbilicus area. She has hx of abd pain and diverticulitis so was unsure of the pain. Denies any fevers , no n/v/d. Denies any pain when voiding. Is suppose to have a hysterectomy pre op next month and wanted to have this treated before. Has not taken anything pta.    Past Medical History:  Diagnosis Date   Acquired hypothyroidism    Antiphospholipid antibody syndrome (HCC)    Autoimmune disorder (HCC)    GERD (gastroesophageal reflux disease)    Mixed hyperlipidemia    Morbid obesity (HCC)    PAT (paroxysmal atrial tachycardia) (HCC)    Restless leg syndrome    Tachycardia     Patient Active Problem List   Diagnosis Date Noted   Atrial fibrillation (HCC)    PAT (paroxysmal atrial tachycardia) (HCC)    Tachycardia    Restless leg syndrome    Morbid obesity (HCC)    Mixed hyperlipidemia    GERD (gastroesophageal reflux disease)    Autoimmune disorder (HCC)    Antiphospholipid antibody syndrome ([place])    Acquired hypothyroidism 09/03/2019    Past Surgical History:  Procedure Laterality Date   TONSILLECTOMY      OB History     Gravida  1   Para      Term      Preterm      AB      Living  0      SAB      IAB      Ectopic      Multiple      Live Births               Home Medications    Prior to Admission medications   Medication Sig Start Date End Date Taking? Authorizing Provider  cephALEXin (KEFLEX) 500 MG capsule Take 1 capsule (500 mg total) by mouth 4 (four) times daily. 06/04/21  Yes Marney Setting, NP  fluconazole (DIFLUCAN) 200 MG tablet Take 1 tablet (200 mg  total) by mouth daily for 7 days. 06/04/21 06/11/21 Yes Marney Setting, NP  BD PEN NEEDLE NANO 2ND GEN 32G X 4 MM MISC Inject 1 Syringe into the skin See admin instructions. To be used with Saxenda 04/02/21   [provider]  butalbital-acetaminophen-caffeine (FIORICET) 50-325-40 MG tablet Take 1 tablet by mouth 2 (two) times daily as needed for migraine.    [provider]  flecainide (TAMBOCOR) 100 MG tablet Take 1 tablet (100 mg total) by mouth 2 (two) times daily. 12/23/20   Tobb, Godfrey Pick, DO  levothyroxine (SYNTHROID) 175 MCG tablet Take 175 mcg by mouth daily before breakfast. 12/25/18   [provider]  Melatonin 3 MG CAPS Take 3 mg by mouth at bedtime.    [provider]  propranolol (INDERAL) 10 MG tablet Take 1 tablet (10 mg total) by mouth daily. 12/02/20   Tobb, Kardie, DO  SAXENDA 18 MG/3ML SOPN Inject 18 mg into the skin daily. 04/06/21   [provider]    Family History  Family History  Problem Relation Age of Onset   Hyperlipidemia Mother    Supraventricular tachycardia Mother    Atrial fibrillation Mother    Hyperlipidemia Father    Healthy Brother    Breast cancer Maternal Aunt    Breast cancer Maternal Grandmother     Social History Social History   Tobacco Use   Smoking status: Some Days    Packs/day: 1.00    Types: Cigarettes   Smokeless tobacco: Never   Tobacco comments:    Non tabacco nicotine pouch  Vaping Use   Vaping Use: Every day  Substance Use Topics   Alcohol use: Yes    Comment: social   Drug use: No     Allergies   Statins, Benadryl [diphenhydramine], Ciprocinonide [fluocinolone], Ciprofloxacin, Nsaids, Tolmetin, and Aspirin   Review of Systems Review of Systems  Constitutional:  Negative for fever.  Respiratory: Negative.    Cardiovascular: Negative.   Gastrointestinal:  Positive for abdominal pain. Negative for blood in stool, constipation, diarrhea, nausea and vomiting.       Small amount of  drainage and bleeding from inner umbilicus area.   Genitourinary: Negative.   Neurological: Negative.     Physical Exam Triage Vital Signs ED Triage Vitals  Enc Vitals Group     BP 06/04/21 1017 (!) 145/83     Pulse Rate 06/04/21 1017 75     Resp 06/04/21 1017 16     Temp 06/04/21 1017 98.1 F (36.7 C)     Temp Source 06/04/21 1017 Oral     SpO2 06/04/21 1017 98 %     Weight --      Height --      Head Circumference --      Peak Flow --      Pain Score 06/04/21 1015 5     Pain Loc --      Pain Edu? --      Excl. in [place]? --    No data found.  Updated Vital Signs BP (!) 145/83 (BP Location: Right Arm)   Pulse 75   Temp 98.1 F (36.7 C) (Oral)   Resp 16   LMP 05/28/2021   SpO2 98%   Visual Acuity Right Eye Distance:   Left Eye Distance:   Bilateral Distance:    Right Eye Near:   Left Eye Near:    Bilateral Near:     Physical Exam Constitutional:      Appearance: She is obese. She is not ill-appearing.  Cardiovascular:     Rate and Rhythm: Normal rate.  Pulmonary:     Effort: Pulmonary effort is normal.  Abdominal:     General: Bowel sounds are normal.     Palpations: Abdomen is soft.     Comments: Umbilicus area has a small amount of blood with clear draining a small pin point opening to upper inner area. When palpated able to expel clear fluid. Pt has pain on palpation.   Skin:    Capillary Refill: Capillary refill takes less than 2 seconds.     Findings: Erythema and lesion present.  Neurological:     Mental Status: She is alert.     UC Treatments / Results  Labs (all labs ordered are listed, but only abnormal results are displayed) Labs Reviewed - No data to display  EKG   Radiology DG Abdomen 1 View  Result Date: 06/04/2021 CLINICAL DATA:  "Umbilicus bleeding and draining, need to r/o abscess" EXAM: ABDOMEN - 1 VIEW COMPARISON:  None. FINDINGS: The bowel gas pattern is normal. No radio-opaque calculi or other significant radiographic  abnormality are seen. No soft tissue abnormality is evident on frontal view. IMPRESSION: Negative. If there is clinical suspicion of an intra-abdominal abscess, a CT of the abdomen and pelvis with contrast should be performed. Electronically Signed   By: Davina Poke D.O.   On: 06/04/2021 12:09    Procedures Procedures (including critical care time)  Medications Ordered in UC Medications - No data to display  Initial Impression / Assessment and Plan / UC Course  I have reviewed the triage vital signs and the nursing notes.  Pertinent labs & imaging results that were available during my care of the patient were reviewed by me and considered in my medical decision making (see chart for details).     We discussed to have an abd x ray completed and if it shows an abscess or fissure you will need to go to the er for a ct scan  Will start on abx for possible infection or abscess to area . Pt understands and agree on treatment plan Take abx full dose with foods  Will call with scan results check your my chart   Called pt with results from x raY  Expressed that for further exam a ct scan would need to be done  Pt can call her pcp and have one scheduled , or can go to the  er or [facility] to have one completed. Not able to r/o an abscess from reading of x ray . Pt understands this plain   Final Clinical Impressions(s) / UC Diagnoses   Final diagnoses:  Umbilical bleeding  Open wound of umbilical region, initial encounter  Periumbilical abdominal pain     Discharge Instructions      We discussed to have an abd x ray completed and if it shows an abscess or fissure you will need to go to the er for a ct scan  Take abx ffull dose with foods  Will call with scan results check your my chart        ED Prescriptions     Medication Sig Dispense Auth. Provider   cephALEXin (KEFLEX) 500 MG capsule Take 1 capsule (500 mg total) by mouth 4 (four) times daily. 20 capsule  Morley Kos L, NP   fluconazole (DIFLUCAN) 200 MG tablet Take 1 tablet (200 mg total) by mouth daily for 7 days. 7 tablet Marney Setting, NP      PDMP not reviewed this encounter.   Marney Setting, NP 06/04/21 [id number]

## 2021-06-04 NOTE — Discharge Instructions (Addendum)
We discussed to have an abd x ray completed and if it shows an abscess or fissure you will need to go to the er for a ct scan  Take abx ffull dose with foods  Will call with scan results check your my chart

## 2021-06-08 ENCOUNTER — Other Ambulatory Visit: Payer: Self-pay

## 2021-06-08 ENCOUNTER — Ambulatory Visit: Payer: BC Managed Care – PPO | Admitting: Obstetrics & Gynecology

## 2021-06-08 ENCOUNTER — Encounter: Payer: Self-pay | Admitting: Obstetrics & Gynecology

## 2021-06-08 VITALS — BP 134/84 | HR 86 | Resp 20

## 2021-06-08 DIAGNOSIS — R198 Other specified symptoms and signs involving the digestive system and abdomen: Secondary | ICD-10-CM | POA: Diagnosis not present

## 2021-06-08 NOTE — Telephone Encounter (Signed)
Spoke with patient while in office. Surgery date request confirmed.  Advised surgery is scheduled for 07/29/21, [facility] at 0830.  Surgery instruction sheet and hospital brochure reviewed, provided to patient in office.  Patient advised if Covid screening and quarantine requirements and agreeable.   Routing to provider. Encounter closed.  Cc: Hayley Carder

## 2021-06-08 NOTE — Progress Notes (Signed)
    Cassidy [name] [birth year] [id number]        40 y.o.  G1P0   RP: Bleeding from the belly button  HPI: Intermittent bleeding from the umbilicus.  Seen in an Urgent Care.  No current bleeding.  No trauma.  Non-tender.  No redness.  No fever.   OB History  Gravida Para Term Preterm AB Living  1         0  SAB IAB Ectopic Multiple Live Births               # Outcome Date GA Lbr Len/2nd Weight Sex Delivery Anes PTL Lv  1 Gravida         FD    Past medical history,surgical history, problem list, medications, allergies, family history and social history were all reviewed and documented in the EPIC chart.   Directed ROS with pertinent positives and negatives documented in the history of present illness/assessment and plan.  Exam:  Vitals:   06/08/21 1016  BP: 134/84  Pulse: 86  Resp: 20   General appearance:  Normal  Abdomen: Umbilicus with no active bleeding.  No nodule or mass felt, NT, no erythema.  Pinpoint opening of the skin from which patient thinks the blood was coming from.  No h/o piercing per patient.  No mole seen.  Gynecologic exam: Deferred   Assessment/Plan:  40 y.o. G1P0   1. Umbilical bleeding  No current bleeding, no nodule or mass, no sign of infection.  Pinpoint opening of the skin.  Decision to refer to [facility] for evaluation and management.  Princess Bruins MD, 10:59 AM 06/08/2021

## 2021-06-13 ENCOUNTER — Encounter: Payer: Self-pay | Admitting: Obstetrics & Gynecology

## 2021-06-24 ENCOUNTER — Ambulatory Visit: Payer: BC Managed Care – PPO | Admitting: Cardiology

## 2021-06-30 ENCOUNTER — Telehealth: Payer: Self-pay | Admitting: *Deleted

## 2021-06-30 NOTE — Telephone Encounter (Signed)
Patient left message on 10/3 requesting return call.  Call returned to patient. Patient states she may need to postpone her surgery scheduled for 07/29/21, due to financial concerns. Will keep surgery as scheduled for now. Patient will return call next week to provide update.   Routing to [employer].

## 2021-07-08 NOTE — Telephone Encounter (Signed)
Patient returned call.  Patient request to cancel surgery scheduled for 07/29/21 due to financial reasons. Does not wish to reschedule at this time. Patient states she will return call after the first of the year to discuss rescheduling. Ore-op and Post-op appts cancelled.   Call placed to central scheduling, spoke with [facility], case cancelled.   I will keep surgery chart.   Routing to provider for final review. Patient is agreeable to disposition. Will close encounter.  Cc: Hayley Carder

## 2021-07-10 ENCOUNTER — Encounter: Payer: BC Managed Care – PPO | Admitting: Obstetrics & Gynecology

## 2021-07-27 ENCOUNTER — Other Ambulatory Visit (HOSPITAL_COMMUNITY): Payer: BC Managed Care – PPO

## 2021-07-29 ENCOUNTER — Encounter (HOSPITAL_BASED_OUTPATIENT_CLINIC_OR_DEPARTMENT_OTHER): Payer: Self-pay

## 2021-07-29 ENCOUNTER — Ambulatory Visit (HOSPITAL_BASED_OUTPATIENT_CLINIC_OR_DEPARTMENT_OTHER): Admit: 2021-07-29 | Payer: BC Managed Care – PPO | Admitting: Obstetrics & Gynecology

## 2021-07-29 SURGERY — XI ROBOTIC ASSISTED LAPAROSCOPIC HYSTERECTOMY AND SALPINGECTOMY
Anesthesia: General | Laterality: Bilateral

## 2021-08-11 ENCOUNTER — Encounter: Payer: BC Managed Care – PPO | Admitting: Obstetrics & Gynecology

## 2021-09-09 ENCOUNTER — Encounter: Payer: BC Managed Care – PPO | Admitting: Obstetrics & Gynecology

## 2021-10-08 ENCOUNTER — Telehealth: Payer: Self-pay

## 2021-10-08 NOTE — Telephone Encounter (Signed)
Spoke with patient regarding rescheduling XI Robotic TLH/BS with Dr. Dellis Filbert. Patient stated that she has new insurance and would like to proceed with scheduling, but wants to know new benefits prior to scheduling. Informed patient I would obtain new benefits and return call. Patient appreciative and agreeable.  Cc: Glorianne Manchester, RN, as Juluis Rainier.

## 2021-10-08 NOTE — Telephone Encounter (Signed)
Spoke with patient regarding surgery benefits. Patient acknowledges understanding of information presented. Patient is aware that benefits presented are professional benefits only. Patient is aware the hospital will call with facility benefits. See account note.  Patient stated that she is going to talk to her wife and her employer and return call to proceed with scheduling.  Routing to Glorianne Manchester, Therapist, sports, as Juluis Rainier.

## 2021-10-09 ENCOUNTER — Telehealth: Payer: Self-pay | Admitting: *Deleted

## 2021-10-09 NOTE — Telephone Encounter (Signed)
Patient called yesterday evening she noticed pelvic pressure when sitting, walking,standing. Reports it was intense that patient could not sleep took tylenol and this did help so she could sleep. No vaginal odor, reports it does burn just a little when she urinates,but reports its not a burn like a UTI. LMP:09/30/21-10/06/21 has dark brown discharge (reports this happens at time) was scheduled for XI ROBOTIC ASSISTED LAPAROSCOPIC HYSTERECTOMY AND SALPINGECTOMY in Nov. 2022,however had to cancel due for finances. Still has pressure feeling today,has Rx for ultram ( from when she broke her toe, prescribed by another provider) has not taken any of this yet. Patient aware of no appointments today. Please advise how you want patient to proceed. Please advise

## 2021-10-09 NOTE — Telephone Encounter (Signed)
Dr.Lavoie replied "Schedule appointment with me next week.  Ibuprofen as needed.  Ultram if Ibuprofen is not enough. "   Patient said she can't take NSAID reports she will use ultram. Message sent to appointments to schedule.

## 2021-10-12 ENCOUNTER — Encounter: Payer: Self-pay | Admitting: Anesthesiology

## 2021-10-12 NOTE — Telephone Encounter (Signed)
Patient scheduled on 10/15/21

## 2021-10-15 ENCOUNTER — Ambulatory Visit: Payer: 59 | Admitting: Obstetrics & Gynecology

## 2021-11-23 NOTE — Telephone Encounter (Signed)
Dr. Dellis Filbert -no return call from patient to date, ok to close encounter?

## 2021-12-02 NOTE — Telephone Encounter (Signed)
Cassidy Bruins, MD  You 4 days ago  ? ?Before closing, I would like her to know that if a Hysterectomy is not well covered?  She has the option of a HSC/Myosure removal of Polyp, D+C and Novasure Endometrial ablation to decrease her menstrual bleeding.  ? ? ?Left message to call Sharee Pimple, RN at [place], [phone]. ? ?

## 2021-12-16 NOTE — Telephone Encounter (Signed)
Spoke with patient to follow up regarding scheduling hysterectomy. Patient stated that she just started a new job and is unable to take 4-6 weeks off of work right now. She is hoping to be able to proceed in September-November. Informed patient of Dr. Assunta Curtis option to proceed with Hysteroscopy/D&C/Myosure/Novaure. Patient stated that she is currently bleeding heavily every 12-15 days and experiencing increased cramping. Patient stated that she would like to proceed with the Hysteroscopy/D&C/Myosure/Novasure at this time to give her some relief until she is able to proceed with XI Robotic TLH/BS. ? ?Patient asked if she would need to obtain cardiac clearance again prior to surgery. Patient stated that she received cardiac clearance in August of last year and does not have another follow up until August of this year.  ? ?Routing to Dr. Dellis Filbert and Glorianne Manchester, RN. ?

## 2021-12-21 NOTE — Telephone Encounter (Signed)
Truddie Hidden, MD  Carder, Hayley 4 days ago  ? ?If no change in medication and clinical state, can proceed with HSC/Myosure/D+C without additional Cardio clearance.  Schedule Preop with me.  ? ?

## 2021-12-21 NOTE — Telephone Encounter (Addendum)
Spoke with patient. Patient reports bleeding is longer, lasting 12-13 days. Reports pelvic pain with no bleeding last week, this has since resolved. Advised as seen below per Dr. Dellis Filbert, patient request to proceed with Hysteroscopy D&C Myosure. Patient requesting to schedule on a Friday. Reviewed surgery dates, patient request to proceed with Surgery on 01/15/22. Pre-op scheduled for 3/29 at 12pm. Advised I will f/u once surgery request confirmed. Patient agreeable.  ? ?Surgery request sent ? ?Routing to Dr. Dellis Filbert -please review and confirm surgery request.  ? ?Cc: Hayley Carder ? ? ? ?

## 2021-12-22 NOTE — Telephone Encounter (Signed)
Princess Bruins, MD  You 19 minutes ago (10:36 AM)  ? ?Yes agree with surgery dates.  ? ?

## 2021-12-23 ENCOUNTER — Ambulatory Visit (INDEPENDENT_AMBULATORY_CARE_PROVIDER_SITE_OTHER): Payer: 59 | Admitting: Obstetrics & Gynecology

## 2021-12-23 ENCOUNTER — Telehealth: Payer: Self-pay | Admitting: *Deleted

## 2021-12-23 ENCOUNTER — Encounter: Payer: Self-pay | Admitting: Obstetrics & Gynecology

## 2021-12-23 VITALS — BP 116/74 | HR 78 | Resp 16 | Ht 62.25 in | Wt 218.0 lb

## 2021-12-23 DIAGNOSIS — D219 Benign neoplasm of connective and other soft tissue, unspecified: Secondary | ICD-10-CM | POA: Diagnosis not present

## 2021-12-23 DIAGNOSIS — N921 Excessive and frequent menstruation with irregular cycle: Secondary | ICD-10-CM | POA: Diagnosis not present

## 2021-12-23 DIAGNOSIS — N840 Polyp of corpus uteri: Secondary | ICD-10-CM | POA: Diagnosis not present

## 2021-12-23 NOTE — Telephone Encounter (Signed)
Call placed to central scheduling, spoke with Delilah Shan, case updated to include Novasure.  ?

## 2021-12-23 NOTE — Telephone Encounter (Signed)
-----   Message from Princess Bruins, MD sent at 12/23/2021 12:45 PM EDT ----- ?Regarding: Surgery scheduling ?Confirming with you that she will have a HSC/Myosure Excision/D+C/Novasure Endometrial Ablation. ? ?Routing to [employer] ?

## 2021-12-23 NOTE — Progress Notes (Signed)
? ? ?  Cassidy [name] 10-06-1980 [id number] ? ? ?     41 y.o.  G1P0A1 Same sex partner present ? ?RP: Preop HSC, Myosure Excision, D+C, Novasure Endometrial Ablation ? ?HPI: Complaining of irregular menses and postcoital bleeding that is new for her. Prior to this change she had 24-day cycles with bleeding time of 3-5 days. For the last 4-5 months she has been having cycles sometimes twice per month with heavy bleeding with clots for first 1-2 days with total bleeding time of 5-7 days. She has also been having spotting in between menses and spotting after intercourse. Same sex partner and only uses digital penetration. Normal pap history. ? ? ?OB History  ?Gravida Para Term Preterm AB Living  ?1         0  ?SAB IAB Ectopic Multiple Live Births  ?        0  ?  ?# Outcome Date GA Lbr Len/2nd Weight Sex Delivery Anes PTL Lv  ?1 Gravida         FD  ? ? ?Past medical history,surgical history, problem list, medications, allergies, family history and social history were all reviewed and documented in the EPIC chart. ? ? ?Directed ROS with pertinent positives and negatives documented in the history of present illness/assessment and plan. ? ?Exam: ? ?Vitals:  ? 12/23/21 1157  ?BP: 116/74  ?Pulse: 78  ?Resp: 16  ?Weight: 218 lb (98.9 kg)  ?Height: 5' 2.25" (1.581 m)  ? ?General appearance:  Normal ? ?Pelvic US 03/18/2021: T/V images.  The uterus is anteverted with multiple fibroids noted, the largest measured at 2.7 cm.  The overall uterine size is measured at 10.47 x 6.73 x 6.56 cm.  The endometrial lining is measured at 10.27 mm.  A 9 mm x 5 mm lesion with a probable feeder vessel is noted at the endometrium, compatible with an endometrial polyp.  Both ovaries are normal in size with normal follicular pattern time, a dominant follicle is present on the left ovary.  No adnexal mass seen.  No free fluid in the posterior cul-de-sac. ? ? ?Assessment/Plan:  41 y.o. G1P0  ? ?1. Menometrorrhagia ?Complaining of irregular menses and  postcoital bleeding that is new for her. Prior to this change she had 24-day cycles with bleeding time of 3-5 days. For the last 4-5 months she has been having cycles sometimes twice per month with heavy bleeding with clots for first 1-2 days with total bleeding time of 5-7 days. She has also been having spotting in between menses and spotting after intercourse. Same sex partner and only uses digital penetration. Normal pap history. Pelvic US c/w an endometrial Polyp.  Decision to proceed with a conservative surgery, HSC, Myosure Excision, D+C, Novasure endometrial ablation.  Surgery and risks thoroughly reviewed.  Preop preparation and postop precautions discussed. ? ?2. Fibroids ?No SM Fibroids, overall uterine size at 10.47 x 6.73 x 6.56 cm. ? ?3. Endometrial polyp ? 9 x 5 mm IU lesion. ? ?Other orders ?- MELATONIN PO; Take 2.5 mg by mouth. gummy  ? ?Princess Bruins MD, 12:19 PM 12/23/2021 ? ? ? ?  ?

## 2021-12-23 NOTE — Telephone Encounter (Signed)
See open telephone encounter dated 10/08/21.  ? ?Encounter closed.  ?

## 2021-12-23 NOTE — Telephone Encounter (Deleted)
-----   Message from Princess Bruins, MD sent at 12/23/2021 12:45 PM EDT ----- ?Regarding: Surgery scheduling ?Confirming with you that she will have a HSC/Myosure Excision/D+C/Novasure Endometrial Ablation. ? ?

## 2021-12-24 NOTE — Telephone Encounter (Signed)
Spoke with patient regarding surgery benefits. Patient acknowledges understanding of information presented. Patient is aware that benefits presented are professional benefits only. Patient is aware the hospital will call with facility benefits. See account note.  Routing to Jill Hamm, RN.  

## 2021-12-24 NOTE — Telephone Encounter (Signed)
Spoke with patient. Surgery date request confirmed.  ?Advised surgery is scheduled for 01/15/22, [facility] at 0830.  ?Surgery instruction sheet and hospital brochure reviewed, printed copy will be mailed.  ?Patient verbalizes understanding and is agreeable.  ? ? 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 Asking if ok to continue melatonin or RX for gabapentin for 7days? Advised I will have to send to Dr. Dellis Filbert to review and advise. ? ?Dr. Dellis Filbert -please advise.  ? ?

## 2021-12-25 ENCOUNTER — Encounter: Payer: Self-pay | Admitting: Obstetrics & Gynecology

## 2021-12-30 ENCOUNTER — Other Ambulatory Visit: Payer: Self-pay | Admitting: Cardiology

## 2021-12-30 NOTE — Telephone Encounter (Signed)
Cassidy Bruins, MD  You 20 minutes ago (8:25 AM)  ? ?Can continue on Melatonin.  ? ?Spoke with patient, advised per Dr. Dellis Filbert.  ? ?Patient states she takes flecainide 100 mg bid. Patient states she discussed medication at pre-op, but unsure if she needs to take prior to surgery or after? Patient typically takes first dose around 10-11am. Advised patient I will review with Dr. Dellis Filbert and f/u.  ? ?Dr. Dellis Filbert -please advise on flecainide.  ?

## 2021-12-31 NOTE — Telephone Encounter (Signed)
Princess Bruins, MD  You 19 hours ago (2:43 PM)  ? ?Recommend taking prior to surgery with a small zip of water.   ? ?Spoke with patient, advised per Dr. Dellis Filbert. Patient will plan to review remaining prescriptions with [facility] PAT when called. Patient is aware to return call to office if any additional questions/concerns.  ? ?Encounter closed.  ?

## 2022-01-12 ENCOUNTER — Encounter (HOSPITAL_BASED_OUTPATIENT_CLINIC_OR_DEPARTMENT_OTHER): Payer: Self-pay | Admitting: Obstetrics & Gynecology

## 2022-01-13 ENCOUNTER — Encounter (HOSPITAL_BASED_OUTPATIENT_CLINIC_OR_DEPARTMENT_OTHER): Payer: Self-pay | Admitting: Obstetrics & Gynecology

## 2022-01-13 ENCOUNTER — Other Ambulatory Visit: Payer: Self-pay

## 2022-01-13 NOTE — Progress Notes (Signed)
Spoke w/ via phone for pre-op interview---pt ?Lab needs dos----  cbc, urine preg              ?Lab results------ current ekg in epic/ chart ?COVID test -----patient states asymptomatic no test needed ?Arrive at ------- 0630 on 01-15-2022 ?NPO after MN NO Solid Food.  Clear liquids from MN until--- 0530 ?Med rec completed ?Medications to take morning of surgery ----- flecainide, propranolol, synthroid ?Diabetic medication ----- n/a ?Patient instructed no nail polish to be worn day of surgery ?Patient instructed to bring photo id and insurance card day of surgery ?Patient aware to have Driver (ride ) / caregiver  for 24 hours after surgery --- spouse, catherine ?Patient Special Instructions ----- n/a ?Pre-Op special Istructions ----- n/a ?Patient verbalized understanding of instructions that were given at this phone interview. ?Patient denies shortness of breath, chest pain, fever, cough at this phone interview.  ? ? ?Anesthesia Review: PAT/ PSVT/ PVCs;  Antiphospholipid antibody syndrome ? ?Cardiologist :  Dr Raliegh Ip. Tobb (lov 05-01-2021 epic) ?EKG :  [id number] epic ?Echo : 10-29-2020 epic ?Event monitor:  11-04-2020 epic ?

## 2022-01-14 NOTE — Anesthesia Preprocedure Evaluation (Addendum)
Anesthesia Evaluation  ?Patient identified by MRN, date of birth, ID band ?Patient awake ? ? ? ?Reviewed: ?Allergy & Precautions, NPO status , Patient's Chart, lab work & pertinent test results ? ?Airway ?Mallampati: I ? ?TM Distance: >3 FB ?Neck ROM: Full ? ? ? Dental ?no notable dental hx. ? ?  ?Pulmonary ?Current Smoker,  ?  ?Pulmonary exam normal ?breath sounds clear to auscultation ? ? ? ? ? ? Cardiovascular ?Exercise Tolerance: Good ?Normal cardiovascular exam+ dysrhythmias (paroxysmal atrial tachycardia) Atrial Fibrillation  ?Rhythm:Regular Rate:Normal ? ? ?  ?Neuro/Psych ? Headaches,   ? GI/Hepatic ?GERD  ,  ?Endo/Other  ?Hypothyroidism obesity ? Renal/GU ?  ? ?  ?Musculoskeletal ?negative musculoskeletal ROS ?(+)  ? Abdominal ?(+) + obese,   ?Peds ? Hematology ?Antiphospholipid antibody syndrome   ?Anesthesia Other Findings ? ? Reproductive/Obstetrics ?negative OB ROS ? ?  ? ? ? ? ? ? ? ? ? ? ? ? ? ?  ?  ? ? ? ? ? ? ? ?Anesthesia Physical ?Anesthesia Plan ? ?ASA: 2 ? ?Anesthesia Plan: General  ? ?Post-op Pain Management: Tylenol PO (pre-op)*  ? ?Induction: Intravenous ? ?PONV Risk Score and Plan: 2 and Treatment may vary due to age or medical condition, Scopolamine patch - Pre-op, Midazolam, Ondansetron and Dexamethasone ? ?Airway Management Planned: LMA ? ?Additional Equipment: None ? ?Intra-op Plan:  ? ?Post-operative Plan: Extubation in OR ? ?Informed Consent: I have reviewed the patients History and Physical, chart, labs and discussed the procedure including the risks, benefits and alternatives for the proposed anesthesia with the patient or authorized representative who has indicated his/her understanding and acceptance.  ? ? ? ?Dental advisory given ? ?Plan Discussed with: CRNA, Anesthesiologist and Surgeon ? ?Anesthesia Plan Comments: (Took flecainide this AM. Norton Blizzard, MD  ?)  ? ? ? ? ? ?Anesthesia Quick Evaluation ? ?

## 2022-01-15 ENCOUNTER — Ambulatory Visit (HOSPITAL_BASED_OUTPATIENT_CLINIC_OR_DEPARTMENT_OTHER)
Admission: RE | Admit: 2022-01-15 | Discharge: 2022-01-15 | Disposition: A | Discharge status: Home / Self Care | Payer: 59 | Attending: Obstetrics & Gynecology | Admitting: Obstetrics & Gynecology

## 2022-01-15 ENCOUNTER — Other Ambulatory Visit: Payer: Self-pay

## 2022-01-15 ENCOUNTER — Ambulatory Visit (HOSPITAL_BASED_OUTPATIENT_CLINIC_OR_DEPARTMENT_OTHER): Payer: 59 | Admitting: Anesthesiology

## 2022-01-15 ENCOUNTER — Encounter (HOSPITAL_BASED_OUTPATIENT_CLINIC_OR_DEPARTMENT_OTHER): Payer: Self-pay | Admitting: Obstetrics & Gynecology

## 2022-01-15 ENCOUNTER — Encounter (HOSPITAL_BASED_OUTPATIENT_CLINIC_OR_DEPARTMENT_OTHER)
Admission: RE | Disposition: A | Discharge status: Home / Self Care | Payer: Self-pay | Source: Home / Self Care | Attending: Obstetrics & Gynecology

## 2022-01-15 DIAGNOSIS — R9389 Abnormal findings on diagnostic imaging of other specified body structures: Secondary | ICD-10-CM | POA: Diagnosis not present

## 2022-01-15 DIAGNOSIS — D6861 Antiphospholipid syndrome: Secondary | ICD-10-CM | POA: Insufficient documentation

## 2022-01-15 DIAGNOSIS — E669 Obesity, unspecified: Secondary | ICD-10-CM | POA: Insufficient documentation

## 2022-01-15 DIAGNOSIS — N921 Excessive and frequent menstruation with irregular cycle: Secondary | ICD-10-CM | POA: Diagnosis not present

## 2022-01-15 DIAGNOSIS — F17210 Nicotine dependence, cigarettes, uncomplicated: Secondary | ICD-10-CM | POA: Diagnosis not present

## 2022-01-15 DIAGNOSIS — Z01818 Encounter for other preprocedural examination: Secondary | ICD-10-CM

## 2022-01-15 DIAGNOSIS — N930 Postcoital and contact bleeding: Secondary | ICD-10-CM | POA: Diagnosis not present

## 2022-01-15 DIAGNOSIS — D259 Leiomyoma of uterus, unspecified: Secondary | ICD-10-CM | POA: Insufficient documentation

## 2022-01-15 DIAGNOSIS — E039 Hypothyroidism, unspecified: Secondary | ICD-10-CM | POA: Diagnosis not present

## 2022-01-15 DIAGNOSIS — N840 Polyp of corpus uteri: Secondary | ICD-10-CM | POA: Insufficient documentation

## 2022-01-15 DIAGNOSIS — D219 Benign neoplasm of connective and other soft tissue, unspecified: Secondary | ICD-10-CM | POA: Diagnosis not present

## 2022-01-15 DIAGNOSIS — R69 Illness, unspecified: Secondary | ICD-10-CM | POA: Diagnosis not present

## 2022-01-15 DIAGNOSIS — N920 Excessive and frequent menstruation with regular cycle: Secondary | ICD-10-CM | POA: Diagnosis not present

## 2022-01-15 DIAGNOSIS — N926 Irregular menstruation, unspecified: Secondary | ICD-10-CM | POA: Diagnosis present

## 2022-01-15 HISTORY — DX: Supraventricular tachycardia, unspecified: I47.10

## 2022-01-15 HISTORY — DX: Irregular menstruation, unspecified: N92.6

## 2022-01-15 HISTORY — DX: Supraventricular tachycardia: I47.1

## 2022-01-15 HISTORY — DX: Postcoital and contact bleeding: N93.0

## 2022-01-15 HISTORY — PX: HYSTEROSCOPY WITH NOVASURE: SHX5574

## 2022-01-15 HISTORY — DX: Leiomyoma of uterus, unspecified: D25.9

## 2022-01-15 HISTORY — DX: Polyp of corpus uteri: N84.0

## 2022-01-15 HISTORY — PX: DILATATION & CURETTAGE/HYSTEROSCOPY WITH MYOSURE: SHX6511

## 2022-01-15 HISTORY — DX: Presence of spectacles and contact lenses: Z97.3

## 2022-01-15 HISTORY — DX: Migraine, unspecified, not intractable, without status migrainosus: G43.909

## 2022-01-15 HISTORY — DX: Dizziness and giddiness: R42

## 2022-01-15 LAB — CBC
HCT: 43.3 % (ref 36.0–46.0)
Hemoglobin: 14.4 g/dL (ref 12.0–15.0)
MCH: 30.3 pg (ref 26.0–34.0)
MCHC: 33.3 g/dL (ref 30.0–36.0)
MCV: 91.2 fL (ref 80.0–100.0)
Platelets: 248 10*3/uL (ref 150–400)
RBC: 4.75 MIL/uL (ref 3.87–5.11)
RDW: 13.5 % (ref 11.5–15.5)
WBC: 9.8 10*3/uL (ref 4.0–10.5)
nRBC: 0 % (ref 0.0–0.2)

## 2022-01-15 LAB — POCT PREGNANCY, URINE: Preg Test, Ur: NEGATIVE

## 2022-01-15 SURGERY — DILATATION & CURETTAGE/HYSTEROSCOPY WITH MYOSURE
Anesthesia: General | Site: Vagina

## 2022-01-15 MED ORDER — OXYCODONE HCL 5 MG/5ML PO SOLN: 5.0000 mg | Freq: Once | ORAL | Status: AC | PRN

## 2022-01-15 MED ORDER — MIDAZOLAM HCL 2 MG/2ML IJ SOLN
INTRAMUSCULAR | Status: AC
  Filled 2022-01-15: qty 2

## 2022-01-15 MED ORDER — LIDOCAINE 2% (20 MG/ML) 5 ML SYRINGE
INTRAMUSCULAR | Status: DC | PRN
  Administered 2022-01-15: 60 mg via INTRAVENOUS

## 2022-01-15 MED ORDER — ONDANSETRON HCL 4 MG/2ML IJ SOLN: 4.0000 mg | Freq: Once | INTRAMUSCULAR | Status: DC | PRN

## 2022-01-15 MED ORDER — MIDAZOLAM HCL 2 MG/2ML IJ SOLN
INTRAMUSCULAR | Status: DC | PRN
  Administered 2022-01-15: 2 mg via INTRAVENOUS

## 2022-01-15 MED ORDER — ACETAMINOPHEN 500 MG PO TABS
1000.0000 mg | ORAL_TABLET | Freq: Once | ORAL | Status: AC
  Administered 2022-01-15: 1000 mg via ORAL

## 2022-01-15 MED ORDER — POVIDONE-IODINE 10 % EX SWAB: 2.0000 "application " | Freq: Once | CUTANEOUS | Status: DC

## 2022-01-15 MED ORDER — PROPOFOL 10 MG/ML IV BOLUS
INTRAVENOUS | Status: DC | PRN
  Administered 2022-01-15: 100 mg via INTRAVENOUS
  Administered 2022-01-15: 50 mg via INTRAVENOUS
  Administered 2022-01-15: 150 mg via INTRAVENOUS

## 2022-01-15 MED ORDER — LIDOCAINE HCL 1 % IJ SOLN
INTRAMUSCULAR | Status: DC | PRN
  Administered 2022-01-15: 20 mL

## 2022-01-15 MED ORDER — LIDOCAINE HCL (PF) 2 % IJ SOLN
INTRAMUSCULAR | Status: AC
  Filled 2022-01-15: qty 5

## 2022-01-15 MED ORDER — OXYCODONE HCL 5 MG PO TABS
5.0000 mg | ORAL_TABLET | Freq: Once | ORAL | Status: AC | PRN
  Administered 2022-01-15: 5 mg via ORAL

## 2022-01-15 MED ORDER — PROPOFOL 10 MG/ML IV BOLUS
INTRAVENOUS | Status: AC
Start: 2022-01-15 — End: ?
  Filled 2022-01-15: qty 20

## 2022-01-15 MED ORDER — CEFAZOLIN SODIUM-DEXTROSE 2-4 GM/100ML-% IV SOLN
INTRAVENOUS | Status: AC
  Filled 2022-01-15: qty 100

## 2022-01-15 MED ORDER — ARTIFICIAL TEARS OPHTHALMIC OINT
TOPICAL_OINTMENT | OPHTHALMIC | Status: AC
  Filled 2022-01-15: qty 3.5

## 2022-01-15 MED ORDER — OXYCODONE HCL 5 MG PO TABS
ORAL_TABLET | ORAL | Status: AC
  Filled 2022-01-15: qty 1

## 2022-01-15 MED ORDER — ONDANSETRON HCL 4 MG/2ML IJ SOLN
INTRAMUSCULAR | Status: DC | PRN
  Administered 2022-01-15: 4 mg via INTRAVENOUS

## 2022-01-15 MED ORDER — ACETAMINOPHEN 500 MG PO TABS
ORAL_TABLET | ORAL | Status: AC
  Filled 2022-01-15: qty 2

## 2022-01-15 MED ORDER — SILVER NITRATE-POT NITRATE 75-25 % EX MISC
CUTANEOUS | Status: DC | PRN
Start: 2022-01-15 — End: 2022-01-15
  Administered 2022-01-15 (×2): 2

## 2022-01-15 MED ORDER — SCOPOLAMINE 1 MG/3DAYS TD PT72
1.0000 | MEDICATED_PATCH | TRANSDERMAL | Status: DC
  Administered 2022-01-15: 1.5 mg via TRANSDERMAL

## 2022-01-15 MED ORDER — FENTANYL CITRATE (PF) 100 MCG/2ML IJ SOLN
INTRAMUSCULAR | Status: DC | PRN
  Administered 2022-01-15: 50 ug via INTRAVENOUS

## 2022-01-15 MED ORDER — LACTATED RINGERS IV SOLN: INTRAVENOUS | Status: DC

## 2022-01-15 MED ORDER — DEXAMETHASONE SODIUM PHOSPHATE 10 MG/ML IJ SOLN
INTRAMUSCULAR | Status: AC
  Filled 2022-01-15: qty 1

## 2022-01-15 MED ORDER — DEXMEDETOMIDINE (PRECEDEX) IN NS 20 MCG/5ML (4 MCG/ML) IV SYRINGE
PREFILLED_SYRINGE | INTRAVENOUS | Status: DC | PRN
  Administered 2022-01-15 (×3): 4 ug via INTRAVENOUS

## 2022-01-15 MED ORDER — ONDANSETRON HCL 4 MG/2ML IJ SOLN
INTRAMUSCULAR | Status: AC
  Filled 2022-01-15: qty 2

## 2022-01-15 MED ORDER — FENTANYL CITRATE (PF) 100 MCG/2ML IJ SOLN
INTRAMUSCULAR | Status: AC
  Filled 2022-01-15: qty 2

## 2022-01-15 MED ORDER — DEXMEDETOMIDINE (PRECEDEX) IN NS 20 MCG/5ML (4 MCG/ML) IV SYRINGE
PREFILLED_SYRINGE | INTRAVENOUS | Status: AC
  Filled 2022-01-15: qty 5

## 2022-01-15 MED ORDER — SCOPOLAMINE 1 MG/3DAYS TD PT72
MEDICATED_PATCH | TRANSDERMAL | Status: AC
  Filled 2022-01-15: qty 1

## 2022-01-15 MED ORDER — SODIUM CHLORIDE 0.9 % IR SOLN
Status: DC | PRN
  Administered 2022-01-15: 3000 mL

## 2022-01-15 MED ORDER — AMISULPRIDE (ANTIEMETIC) 5 MG/2ML IV SOLN: 10.0000 mg | Freq: Once | INTRAVENOUS | Status: DC | PRN

## 2022-01-15 MED ORDER — CEFAZOLIN SODIUM-DEXTROSE 2-4 GM/100ML-% IV SOLN
2.0000 g | INTRAVENOUS | Status: AC
  Administered 2022-01-15: 2 g via INTRAVENOUS

## 2022-01-15 MED ORDER — DEXAMETHASONE SODIUM PHOSPHATE 10 MG/ML IJ SOLN
INTRAMUSCULAR | Status: DC | PRN
  Administered 2022-01-15: 10 mg via INTRAVENOUS

## 2022-01-15 MED ORDER — FENTANYL CITRATE (PF) 100 MCG/2ML IJ SOLN: 25.0000 ug | INTRAMUSCULAR | Status: DC | PRN

## 2022-01-15 SURGICAL SUPPLY — 15 items
ABLATOR SURESOUND NOVASURE (ABLATOR) ×2 IMPLANT
DEVICE MYOSURE REACH (MISCELLANEOUS) ×1 IMPLANT
GLOVE BIO SURGEON STRL SZ 6.5 (GLOVE) ×2 IMPLANT
GLOVE BIOGEL PI IND STRL 7.0 (GLOVE) ×2 IMPLANT
GLOVE BIOGEL PI INDICATOR 7.0 (GLOVE) ×4
GOWN STRL REUS W/TWL LRG LVL3 (GOWN DISPOSABLE) ×4 IMPLANT
IV NS IRRIG 3000ML ARTHROMATIC (IV SOLUTION) ×2 IMPLANT
KIT PROCEDURE FLUENT (KITS) ×2 IMPLANT
KIT TURNOVER CYSTO (KITS) ×2 IMPLANT
PACK VAGINAL MINOR WOMEN LF (CUSTOM PROCEDURE TRAY) ×2 IMPLANT
PAD OB MATERNITY 4.3X12.25 (PERSONAL CARE ITEMS) ×2 IMPLANT
PAD PREP 24X48 CUFFED NSTRL (MISCELLANEOUS) ×2 IMPLANT
SEAL ROD LENS SCOPE MYOSURE (ABLATOR) ×2 IMPLANT
SOL PREP POV-IOD 4OZ 10% (MISCELLANEOUS) ×1 IMPLANT
TOWEL OR 17X26 10 PK STRL BLUE (TOWEL DISPOSABLE) ×3 IMPLANT

## 2022-01-15 NOTE — Op Note (Signed)
Operative Note ? ?01/15/2022 ? ?9:10 AM ? ?PATIENT:  Cassidy [name]  41 y.o. female ? ?PRE-OPERATIVE DIAGNOSIS:  Menometrorrhagia, fibroids, endometrial polyp ? ?POST-OPERATIVE DIAGNOSIS:  Menometrorrhagia, fibroids, thickened endometrium, possible endometrial polyp ? ?PROCEDURE:  Procedure(s): ?DILATATION & CURETTAGE/HYSTEROSCOPY WITH MYOSURE ?HYSTEROSCOPY WITH NOVASURE ? ?SURGEON:  Surgeon(s): ?Princess Bruins, MD ? ?ANESTHESIA:   general ? ?FINDINGS: Thickened endometrium, no distinct polyp.  Ostia well seen. ? ?DESCRIPTION OF OPERATION: Under general anesthesia with laryngeal mask, the patient is in lithotomy position.  She is prepped with Betadine on the suprapubic, vulvar and vaginal areas.  She is draped as usual.  Ancef 2 g is given.  PAS are on.  Timeout is done.  The vaginal exam reveals an anteverted uterus, about 9 cm in length, mobile, no adnexal mass felt.  The speculum is inserted in the vagina and the anterior lip of the cervix is grasped with a tenaculum.  A paracervical block is done with lidocaine 1% a total of 20 cc at 4 and 8:00.  Dilation of the cervix with Pratt dilators up to #21 without difficulty.  The cervical length is measured at 4 cm and the hysterometry is at 8 cm for a uterine cavity length of 4 cm.  The hysteroscope was inserted in the intrauterine cavity and inspection reveals 2 normal ostia, a thickened endometrium and no distinct polyp.  Pictures are taken.  The MyoSure reach is inserted.  The areas of thickened endometrium are excised.  Pictures are taken after the excision.  The hysteroscope with MyoSure are removed.  We further dilate the cervix to [facility] dilator #25.  The NovaSure instrument is inserted in the intra uterine cavity.  The cavity width is measured at 2.8 cm.  The security test is passed.  We proceeded with NovaSure endometrial ablation during 2 minutes at a power of 32 W.  The NovaSure instrument is then easily removed.  Good amount of charred material is seen on  the NovaSure instrument.  The tenaculum is removed from the cervix.  Silver nitrate is used to complete hemostasis at that level.  The speculum is removed.  The patient is brought to recovery room in good and stable status. ? ?ESTIMATED BLOOD LOSS: 5 cc ?FLUID DEFICIT: 80 cc ? ? ?Intake/Output Summary (Last 24 hours) at 01/15/2022 0910 ?Last data filed at 01/15/2022 0848 ?Gross per 24 hour  ?Intake 600 ml  ?Output --  ?Net 600 ml  ?  ? ?BLOOD ADMINISTERED:none  ? ?LOCAL MEDICATIONS USED:  LIDOCAINE 1% 20 cc for Paracervical block  ? ?SPECIMEN:  Source of Specimen:  Excision material from thickened Endometrium and Endometrial curetting. ? ?DISPOSITION OF SPECIMEN:  PATHOLOGY ? ?COUNTS:  YES ? ?PLAN OF CARE: Transfer to PACU ? ?Marie-Lyne LavoieMD9:10 AMT ? ? ?   ?

## 2022-01-15 NOTE — Transfer of Care (Signed)
Immediate Anesthesia Transfer of Care Note ? ?Patient: Cassidy [name] ? ?Procedure(s) Performed: Procedure(s) (LRB): ?DILATATION & CURETTAGE/HYSTEROSCOPY WITH MYOSURE (N/A) ?HYSTEROSCOPY WITH NOVASURE (N/A) ? ?Patient Location: PACU ? ?Anesthesia Type: General ? ?Level of Consciousness: sleepy ? ?Airway & Oxygen Therapy: Patient Spontanous Breathing and Patient connected to face mask oxygen, oral airway remaining. ? ?Post-op Assessment: Report given to PACU RN and Post -op Vital signs reviewed and stable ? ?Post vital signs: Reviewed and stable ? ?Complications: No apparent anesthesia complications ? ?Last Vitals:  ?Vitals Value Taken Time  ?BP 102/72 01/15/22 0918  ?Temp 36.4 ?C 01/15/22 0918  ?Pulse 66 01/15/22 0921  ?Resp 15 01/15/22 0921  ?SpO2 96 % 01/15/22 0921  ?Vitals shown include unvalidated device data. ? ?Last Pain:  ?Vitals:  ? 01/15/22 0709  ?TempSrc: Oral  ?PainSc: 0-No pain  ?   ? ?Patients Stated Pain Goal: 3 (01/15/22 0709) ? ?Complications: No notable events documented. ?

## 2022-01-15 NOTE — H&P (Signed)
[name] is an 41 y.o. female. [id number] Same sex partner  ?  ?RP: HSC, Myosure Excision, D+C, Novasure Endometrial Ablation ?  ?HPI: No change x office visit 12/23/21: Complaining of irregular menses and postcoital bleeding that is new for her. Prior to this change she had 24-day cycles with bleeding time of 3-5 days. For the last 4-5 months she has been having cycles sometimes twice per month with heavy bleeding with clots for first 1-2 days with total bleeding time of 5-7 days. She has also been having spotting in between menses and spotting after intercourse. Same sex partner and only uses digital penetration. Normal pap history. ?  ? ? ?Pertinent Gynecological History: ?Menses:  Menometrorrhagia ?Contraception:  Same sex partner ?Blood transfusions: none ?Sexually transmitted diseases: no past history ?Last mammogram: normal  ?Last pap: normal  ?  ? ?Menstrual History: ? ?Patient's last menstrual period was 01/04/2022 (approximate). ?  ? ?Past Medical History:  ?Diagnosis Date  ? Acquired hypothyroidism   ? endocrinologist-- Lalla Brothers NP ([facility]-- [place])  ? Antiphospholipid antibody syndrome (HCC)   ? hematology/ oncology--- dr Marin Olp;  dx age 22 work-up done due to still birth (umbilical cord w/ clot)  ? Endometrial polyp   ? GERD (gastroesophageal reflux disease)   ? Irregular menses   ? Migraines   ? Mixed hyperlipidemia   ? PAT (paroxysmal atrial tachycardia) ([place])   ? cardiologist--- dr Raliegh Ip. tobb;  long hx takes flecainide and propranolol;   event monitor 11-04-2020 symptomatic occasional PAC SVT likely PAT;  echo 10-29-2020 ef 6-65%  ? Postcoital bleeding   ? PSVT (paroxysmal supraventricular tachycardia) ([place])   ? Restless leg syndrome   ? Uterine leiomyoma   ? Vertigo   ? Wears glasses   ? ? ?Past Surgical History:  ?Procedure Laterality Date  ? COLONOSCOPY WITH ESOPHAGOGASTRODUODENOSCOPY (EGD)  2021  ? TONSILLECTOMY    ? child  ? ? ?Family History  ?Problem Relation Age of Onset  ?  Hyperlipidemia Mother   ? Supraventricular tachycardia Mother   ? Atrial fibrillation Mother   ? Hyperlipidemia Father   ? Healthy Brother   ? Breast cancer Maternal Aunt   ? Breast cancer Maternal Grandmother   ? ? ?Social History:  reports that she has been smoking cigarettes. She has never used smokeless tobacco. She reports current alcohol use. She reports that she does not use drugs. ? ?Allergies:  ?Allergies  ?Allergen Reactions  ? Statins   ?  Muscle aches  ? Benadryl [Diphenhydramine] Other (See Comments)  ?  "restless leg, so I need gabapentin with it"  ? Ciprofloxacin   ?  Flu-like symptoms with muscle aches  ? Nsaids Hives  ?  PER PT THIS INCLUDES ALL TYPES OF NSAIDS THAT CAUSE HIVES  ? Other   ?  Per pt paprika causes tachycardia  ? Tolmetin Hives  ? Aspirin Hives  ? ? ?Medications Prior to Admission  ?Medication Sig Dispense Refill Last Dose  ? acetaminophen (TYLENOL) 500 MG tablet Take 500 mg by mouth every 6 (six) hours as needed.   Past Week  ? flecainide (TAMBOCOR) 100 MG tablet TAKE ONE TABLET BY MOUTH TWICE A DAY (Patient taking differently: Take 100 mg by mouth 2 (two) times daily. Per pt takes 1000 and 2200 (works night shift)) 180 tablet 0 01/15/2022 at 0530  ? levothyroxine (SYNTHROID) 175 MCG tablet Take 175 mcg by mouth daily before breakfast. Monday to Saturday ,  takes half tab on  sunday's   01/14/2022 at 0900  ? MELATONIN PO Take 2.5 mg by mouth daily. Gummy  (WORKS NIGHT SHIFT)   01/14/2022 at 1300  ? propranolol (INDERAL) 10 MG tablet TAKE ONE TABLET BY MOUTH DAILY (Patient taking differently: Take 10 mg by mouth daily.) 90 tablet 0 01/15/2022 at 0530  ? butalbital-acetaminophen-caffeine (FIORICET) 50-325-40 MG tablet Take 1 tablet by mouth 2 (two) times daily as needed for migraine.   More than a month  ? ? ?REVIEW OF SYSTEMS: A ROS was performed and pertinent positives and negatives are included in the history. ? GENERAL: No fevers or chills. HEENT: No change in vision, no earache, sore  throat or sinus congestion. NECK: No pain or stiffness. CARDIOVASCULAR: No chest pain or pressure. No palpitations. PULMONARY: No shortness of breath, cough or wheeze. GASTROINTESTINAL: No abdominal pain, nausea, vomiting or diarrhea, melena or bright red blood per rectum. GENITOURINARY: No urinary frequency, urgency, hesitancy or dysuria. MUSCULOSKELETAL: No joint or muscle pain, no back pain, no recent trauma. DERMATOLOGIC: No rash, no itching, no lesions. ENDOCRINE: No polyuria, polydipsia, no heat or cold intolerance. No recent change in weight. HEMATOLOGICAL: No anemia or easy bruising or bleeding. NEUROLOGIC: No headache, seizures, numbness, tingling or weakness. PSYCHIATRIC: No depression, no loss of interest in normal activity or change in sleep pattern.  ? ? ? ?Blood pressure 140/88, pulse 69, temperature 99.1 ?F (37.3 ?C), temperature source Oral, resp. rate 18, height '5\' 3"'$  (1.6 m), weight 98.3 kg, last menstrual period 01/04/2022, SpO2 98 %. ? ?Physical Exam: ? ? ?Results for orders placed or performed during the hospital encounter of 01/15/22 (from the past 24 hour(s))  ?Pregnancy, urine POC     Status: None  ? Collection Time: 01/15/22  6:49 AM  ?Result Value Ref Range  ? Preg Test, Ur NEGATIVE NEGATIVE  ?CBC     Status: None  ? Collection Time: 01/15/22  7:34 AM  ?Result Value Ref Range  ? WBC 9.8 4.0 - 10.5 K/uL  ? RBC 4.75 3.87 - 5.11 MIL/uL  ? Hemoglobin 14.4 12.0 - 15.0 g/dL  ? HCT 43.3 36.0 - 46.0 %  ? MCV 91.2 80.0 - 100.0 fL  ? MCH 30.3 26.0 - 34.0 pg  ? MCHC 33.3 30.0 - 36.0 g/dL  ? RDW 13.5 11.5 - 15.5 %  ? Platelets 248 150 - 400 K/uL  ? nRBC 0.0 0.0 - 0.2 %  ? ?Pelvic US 03/18/2021: T/V images.  The uterus is anteverted with multiple fibroids noted, the largest measured at 2.7 cm.  The overall uterine size is measured at 10.47 x 6.73 x 6.56 cm.  The endometrial lining is measured at 10.27 mm.  A 9 mm x 5 mm lesion with a probable feeder vessel is noted at the endometrium, compatible with an  endometrial polyp.  Both ovaries are normal in size with normal follicular pattern time, a dominant follicle is present on the left ovary.  No adnexal mass seen.  No free fluid in the posterior cul-de-sac. ?  ?  ?Assessment/Plan:  40 y.o. G1P0  ?  ?1. Menometrorrhagia ?Complaining of irregular menses and postcoital bleeding that is new for her. Prior to this change she had 24-day cycles with bleeding time of 3-5 days. For the last 4-5 months she has been having cycles sometimes twice per month with heavy bleeding with clots for first 1-2 days with total bleeding time of 5-7 days. She has also been having spotting in between menses and spotting after  intercourse. Same sex partner and only uses digital penetration. Normal pap history. Pelvic US c/w an endometrial Polyp.  Decision to proceed with a conservative surgery, HSC, Myosure Excision, D+C, Novasure endometrial ablation. Surgery and risks thoroughly reviewed.  Preop preparation and postop precautions discussed. ?  ?2. Fibroids ?No SM Fibroids, overall uterine size at 10.47 x 6.73 x 6.56 cm. ?  ?3. Endometrial polyp ? 9 x 5 mm IU lesion. ?  ?Other orders ?- MELATONIN PO; Take 2.5 mg by mouth. gummy  ? ?                      Patient was counseled as to the risk of surgery to include the following: ? ?1. Infection (prohylactic antibiotics will be administered) ? ?2. DVT/Pulmonary Embolism (prophylactic pneumo compression stockings will be used) ? ?3.Trauma to internal organs requiring additional surgical procedure to repair any injury to internal organs requiring perhaps additional hospitalization days. ? ?4.Hemmorhage requiring transfusion and blood products which carry risks such as anaphylactic reaction, hepatitis and AIDS ? ?Patient had received literature information on the procedure scheduled and all her questions were answered and fully accepts all risk. ? ? ?  ?Cassidy [name] ?01/15/2022, 7:58 AM ?  ?

## 2022-01-15 NOTE — Anesthesia Postprocedure Evaluation (Signed)
Anesthesia Post Note ? ?Patient: Cassidy [name] ? ?Procedure(s) Performed: DILATATION & CURETTAGE/HYSTEROSCOPY WITH MYOSURE (Vagina ) ?HYSTEROSCOPY WITH NOVASURE (Vagina ) ? ?  ? ?Patient location during evaluation: PACU ?Anesthesia Type: General ?Level of consciousness: awake ?Pain management: pain level controlled ?Vital Signs Assessment: post-procedure vital signs reviewed and stable ?Respiratory status: spontaneous breathing and respiratory function stable ?Cardiovascular status: stable ?Postop Assessment: no apparent nausea or vomiting ?Anesthetic complications: no ? ? ?No notable events documented. ? ?Last Vitals:  ?Vitals:  ? 01/15/22 0945 01/15/22 1030  ?BP: (!) 132/91 135/77  ?Pulse: 65 70  ?Resp: 17 16  ?Temp:  37.1 ?C  ?SpO2: 94% 96%  ?  ?Last Pain:  ?Vitals:  ? 01/15/22 1030  ?TempSrc:   ?PainSc: 3   ? ? ?  ?  ?  ?  ?  ?  ? ?Merlinda Frederick ? ? ? ? ?

## 2022-01-15 NOTE — Discharge Instructions (Addendum)
?  Post Anesthesia Home Care Instructions ? ?Activity: ?Get plenty of rest for the remainder of the day. A responsible individual must stay with you for 24 hours following the procedure.  ?For the next 24 hours, DO NOT: ?-Drive a car ?-Paediatric nurse ?-Drink alcoholic beverages ?-Take any medication unless instructed by your physician ?-Make any legal decisions or sign important papers. ? ?Meals: ?Start with liquid foods such as gelatin or soup. Progress to regular foods as tolerated. Avoid greasy, spicy, heavy foods. If nausea and/or vomiting occur, drink only clear liquids until the nausea and/or vomiting subsides. Call your physician if vomiting continues. ? ?Special Instructions/Symptoms: ?Your throat may feel dry or sore from the anesthesia or the breathing tube placed in your throat during surgery. If this causes discomfort, gargle with warm salt water. The discomfort should disappear within 24 hours. ? ?If you had a scopolamine patch placed behind your ear for the management of post- operative nausea and/or vomiting: ? ?1. The medication in the patch is effective for 72 hours, after which it should be removed.  Wrap patch in a tissue and discard in the trash. Wash hands thoroughly with soap and water. ?2. You may remove the patch earlier than 72 hours if you experience unpleasant side effects which may include dry mouth, dizziness or visual disturbances. ?3. Avoid touching the patch. Wash your hands with soap and water after contact with the patch. ? ?Remove patch behind left ear by Sunday, January 18, 2022. ? ?Oxycodone given at 9:58 AM. ?

## 2022-01-15 NOTE — Anesthesia Procedure Notes (Signed)
Procedure Name: LMA Insertion ?Date/Time: 01/15/2022 8:31 AM ?Performed by: Mechele Claude, CRNA ?Pre-anesthesia Checklist: Patient identified, Emergency Drugs available, Suction available and Patient being monitored ?Patient Re-evaluated:Patient Re-evaluated prior to induction ?Oxygen Delivery Method: Circle system utilized ?Preoxygenation: Pre-oxygenation with 100% oxygen ?Induction Type: IV induction ?Ventilation: Mask ventilation without difficulty ?LMA: LMA inserted ?LMA Size: 4.0 ?Number of attempts: 1 ?Airway Equipment and Method: Bite block ?Placement Confirmation: positive ETCO2 ?Tube secured with: Tape ?Dental Injury: Teeth and Oropharynx as per pre-operative assessment  ? ? ? ? ?

## 2022-01-17 ENCOUNTER — Telehealth: Payer: Self-pay | Admitting: Obstetrics and Gynecology

## 2022-01-17 NOTE — Telephone Encounter (Signed)
Phone call after hours from patient.  ? ?Patient is status post hysteroscopy, dilation and curettage, Novasure endometrial ablation 01/15/22 who calls with constipation.  ? ?No bowel movement since 01/14/22 evening.  ?Has not taken anything to relieve the constipation.  ? ?She has some cramping developing as she has the need to have a BM.  ?Feeling full.  ?Passing flatus.  ?Denies fever.  ?Has some yellow vaginal discharge the clots the size of peas.  ?No significant bleeding.  ? ?Not taking any pain medication. ? ?I recommended a suppository like glycerin for immediate relief.  ?She has Dulcolax stool softener at home, which I recommended she could use daily as needed. ?I also mentioned Senna-S laxative and magnesium citrate if the above do not work well. ? ?I encouraged fresh fruits, vegetables, increased hydration, and a coffee or warm tea.  ? ?She will call tomorrow if she has not gotten relief.  ? ?

## 2022-01-18 ENCOUNTER — Telehealth: Payer: Self-pay

## 2022-01-18 LAB — SURGICAL PATHOLOGY

## 2022-01-18 NOTE — Telephone Encounter (Signed)
Per Dr. Dellis Filbert- ? ?"No such limitation.  Given that she has no incision, can lift as much as she wants without risk associated with postop.  No limitation at all at this time." ? ?Pt notified and voiced understanding. ?Also wants to inquire about the other restrictions she was given to see if she needed to go by them or not. ? ?States x 2 weeks: ?No sex, No baths (can shower), no tampons. ? ?Also reports some light-mod bleeding, nothing too heavy. Pt advised that is to be expected after procedure and if bleeding gets too heavy (soaking pad/tampon qhr) then let us know.  ? ?Please confirm/deny restrictions and anything to add on bleeding? Thanks.  ?

## 2022-01-18 NOTE — Telephone Encounter (Signed)
Pt calling this AM reporting while being discharged this past Friday from [facility] w/ Myosure procedure, she was given restrictions that were not listed on her discharge summary. Pt reports specifically the fact that she should not lift >10-15lbs x 2 weeks. She is stating she will need note for this. Please advise.  ?

## 2022-01-19 ENCOUNTER — Encounter (HOSPITAL_BASED_OUTPATIENT_CLINIC_OR_DEPARTMENT_OTHER): Payer: Self-pay | Admitting: Obstetrics & Gynecology

## 2022-01-22 ENCOUNTER — Encounter: Payer: Self-pay | Admitting: Radiology

## 2022-01-22 ENCOUNTER — Ambulatory Visit: Payer: 59 | Admitting: Radiology

## 2022-01-22 ENCOUNTER — Telehealth: Payer: Self-pay

## 2022-01-22 VITALS — BP 124/76 | Temp 98.4°F

## 2022-01-22 DIAGNOSIS — N898 Other specified noninflammatory disorders of vagina: Secondary | ICD-10-CM | POA: Diagnosis not present

## 2022-01-22 LAB — WET PREP FOR TRICH, YEAST, CLUE

## 2022-01-22 NOTE — Telephone Encounter (Signed)
Pt had D&C w/ mysure on 01/15/22.  ? ?Calling to report having mild cramping and clear/pink moderate discharge since surgery--reports its almost stopped and now is is seeing greyish/brown/sometimes yellowish discharge. Noticed slight itching earlier and some burning after voiding but no dysuria and not constant. Denies odor. Pt took tylenol around 12 due to a HA but I asked her to check to temp. She reported 16F but has more concerns re: discharge. Please advise.  ?

## 2022-01-22 NOTE — Telephone Encounter (Signed)
She needs an appointment. Cassidy [name] has openings and can get me if need be.  ?

## 2022-01-22 NOTE — Progress Notes (Signed)
? ? ? ? ?  Subjective: Cassidy [name] is a 41 y.o. 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 ? ?Review of Systems negative except noted in HPI ? ?Objective: ? ?-Vulva: without lesions or discharge ?-Vagina: discharge present,thin, watery blood tinged ?-Cervix: no lesion or discharge, no CMT ?-Perineum: no lesions ?-Uterus: Mobile, tender with palpation ?-Adnexa: no masses or tenderness ? ? ?Microscopic wet-mount exam shows negative for pathogens, normal epithelial cells.  ? ?Chaperone offered and declined. ? ?Assessment:/Plan:  ? ?1. Vaginal discharge ?Reassured common after procedure. ?Work note given for [employer]. ?- WET PREP FOR [place], YEAST, CLUE ?  ?Avoid intercourse until symptoms are resolved. Safe sex encouraged. Avoid the use of soaps or perfumed products in the peri area. Avoid tub baths and sitting in sweaty or wet clothing for prolonged periods of time.   ?

## 2022-01-22 NOTE — Telephone Encounter (Signed)
Cassidy [name] with pt and placed her on your scheduled for 230. Pt states she feels as if she may have over exaggerated things a bit but would still like to come in. Spoke less about discharge and more about low abdominal pains--kind of feeling like gas but that passed and discomfort was still present.  ?

## 2022-01-28 ENCOUNTER — Encounter: Payer: Self-pay | Admitting: Obstetrics & Gynecology

## 2022-01-28 ENCOUNTER — Ambulatory Visit (INDEPENDENT_AMBULATORY_CARE_PROVIDER_SITE_OTHER): Payer: 59 | Admitting: Obstetrics & Gynecology

## 2022-01-28 VITALS — BP 120/80

## 2022-01-28 DIAGNOSIS — R102 Pelvic and perineal pain: Secondary | ICD-10-CM | POA: Diagnosis not present

## 2022-01-28 DIAGNOSIS — Z09 Encounter for follow-up examination after completed treatment for conditions other than malignant neoplasm: Secondary | ICD-10-CM

## 2022-01-28 MED ORDER — SULFAMETHOXAZOLE-TRIMETHOPRIM 800-160 MG PO TABS: 1.0000 | ORAL_TABLET | Freq: Two times a day (BID) | ORAL | 0 refills | Status: AC

## 2022-01-28 NOTE — Progress Notes (Signed)
? ? ?  SHIVONNE SCHWARTZMAN [birth year] [id number] ? ? ?     41 y.o.  G1P0A1  Same sex partner  ? ?RP: Postop HSC/Myosure/D+C/Novasure Endometrial Ablation on 01/15/22 ? ?HPI: No vaginal bleeding currently.  Came last week for vaginal discharge, gyn exam with Jami NP was normal for Postop 1 week Endometrial Ablation.  Improved vaginal discharge.  Pt c/o pelvic pain & burning when bladder gets full.  No fever. ? ? ?OB History  ?Gravida Para Term Preterm AB Living  ?1         0  ?SAB IAB Ectopic Multiple Live Births  ?        0  ?  ?# Outcome Date GA Lbr Len/2nd Weight Sex Delivery Anes PTL Lv  ?1 Gravida         FD  ? ? ?Past medical history,surgical history, problem list, medications, allergies, family history and social history were all reviewed and documented in the EPIC chart. ? ? ?Directed ROS with pertinent positives and negatives documented in the history of present illness/assessment and plan. ? ?Exam: ? ?Vitals:  ? 01/28/22 1114  ?BP: 120/80  ? ?General appearance:  Normal ? ?Abdomen: Soft, NT ? ?Gynecologic exam: Vulva normal.  Speculum:  Cervix/Vagina normal.  Vaginal discharge with pinkish tint, wnl.  Bimanual exam:  Uterus AV, normal volume, mobile, NT.  No adnexal mass. ? ?U/A:  Yellow cloudy, Pro Trace, Nit Neg.  WBC 6-10, RBC 20-40, Bacteria Many.  U. Culture pending. ? ?Patho 01/15/22:  ?FINAL MICROSCOPIC DIAGNOSIS:  ? ?A.  ENDOMETRIUM, CURETTAGE:  ?-  Secretory endometrium  ?-  Focal features suggestive of endometrial polyp  ?-  No malignancy identified  ? ? ?Assessment/Plan:  41 y.o. G1P0  ? ?1. Status post gynecological surgery, follow-up exam ?Very good postop healing.  No sign of infection.  Patho benign, patient informed.  Can resume all physical and sexual activities. ? ?2. Pelvic pain ?U/A abnormal.  Probable acute cystitis.  Will treat with Bactrim DS 1 tab PO BID x 3 days.  Usage reviewed, prescription sent to pharmacy.  Pending U. Culture. ?- Urinalysis,Complete w/RFL Culture ? ?Other orders ?-  sulfamethoxazole-trimethoprim (BACTRIM DS) 800-160 MG tablet; Take 1 tablet by mouth 2 (two) times daily for 3 days.  ? ?Princess Bruins MD, 11:24 AM 01/28/2022 ? ? ? ?  ?

## 2022-01-29 NOTE — Telephone Encounter (Signed)
Pt seen on 01/28/22.  ?

## 2022-01-30 LAB — CULTURE INDICATED

## 2022-01-30 LAB — URINALYSIS, COMPLETE W/RFL CULTURE
Casts: NONE SEEN /LPF
Crystals: NONE SEEN /HPF
Glucose, UA: NEGATIVE
Hyaline Cast: NONE SEEN /LPF
Ketones, ur: NEGATIVE
Leukocyte Esterase: NEGATIVE
Nitrites, Initial: NEGATIVE
Specific Gravity, Urine: 1.029 (ref 1.001–1.035)
Yeast: NONE SEEN /HPF
pH: 5 (ref 5.0–8.0)

## 2022-01-30 LAB — URINE CULTURE
MICRO NUMBER:: 13351723
Result:: NO GROWTH
SPECIMEN QUALITY:: ADEQUATE

## 2022-02-02 DIAGNOSIS — E039 Hypothyroidism, unspecified: Secondary | ICD-10-CM | POA: Diagnosis not present

## 2022-02-03 DIAGNOSIS — E039 Hypothyroidism, unspecified: Secondary | ICD-10-CM | POA: Diagnosis not present

## 2022-04-01 ENCOUNTER — Other Ambulatory Visit: Payer: Self-pay | Admitting: Cardiology

## 2022-04-02 ENCOUNTER — Other Ambulatory Visit: Payer: Self-pay | Admitting: Cardiology

## 2022-04-02 MED ORDER — PROPRANOLOL HCL 10 MG PO TABS: 10.0000 mg | ORAL_TABLET | Freq: Every day | ORAL | 0 refills | Status: DC

## 2022-04-02 MED ORDER — FLECAINIDE ACETATE 100 MG PO TABS: 100.0000 mg | ORAL_TABLET | Freq: Two times a day (BID) | ORAL | 0 refills | Status: DC

## 2022-04-02 NOTE — Telephone Encounter (Signed)
Sent prescription for 1 month  refill for both medication until patient annual visit  Sent message to patient

## 2022-04-03 IMAGING — MG MM DIGITAL DIAGNOSTIC UNILAT*R* W/ TOMO W/ CAD
4 series · 4 of 12 positions shown · non-contrast
Comparison: Baseline screening mammogram dated 03/25/2020 and right
breast ultrasound dated 03/28/2020.

CLINICAL DATA: Follow-up probably benign mass in the 2 o'clock
position of the right breast.

EXAM:
DIGITAL DIAGNOSTIC RIGHT MAMMOGRAM WITH CAD AND TOMO
ULTRASOUND RIGHT BREAST

[R CC synth-2D]
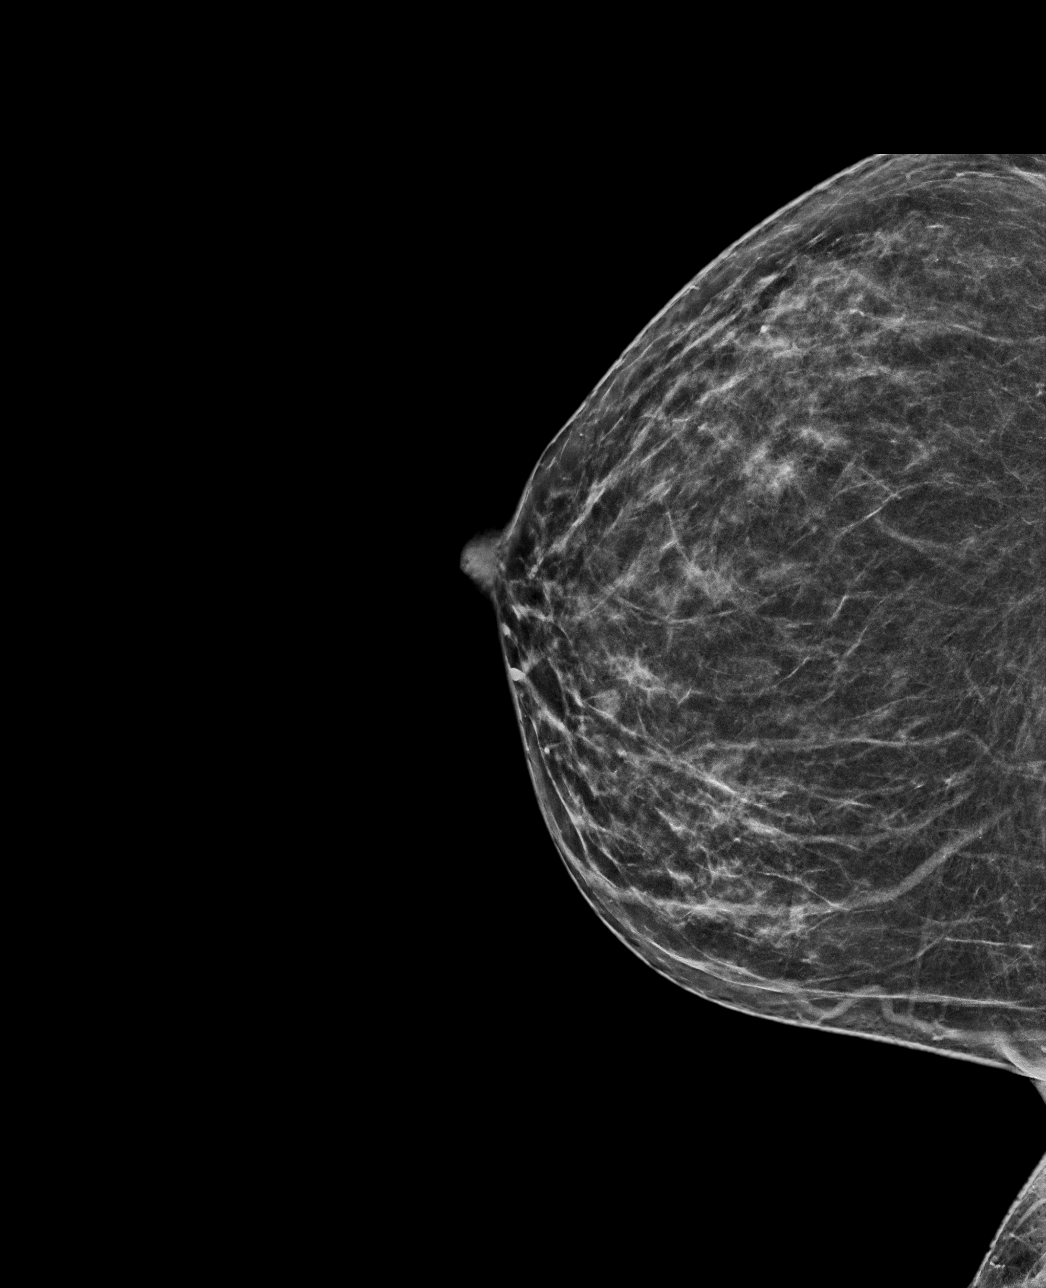

[R MLO synth-2D]
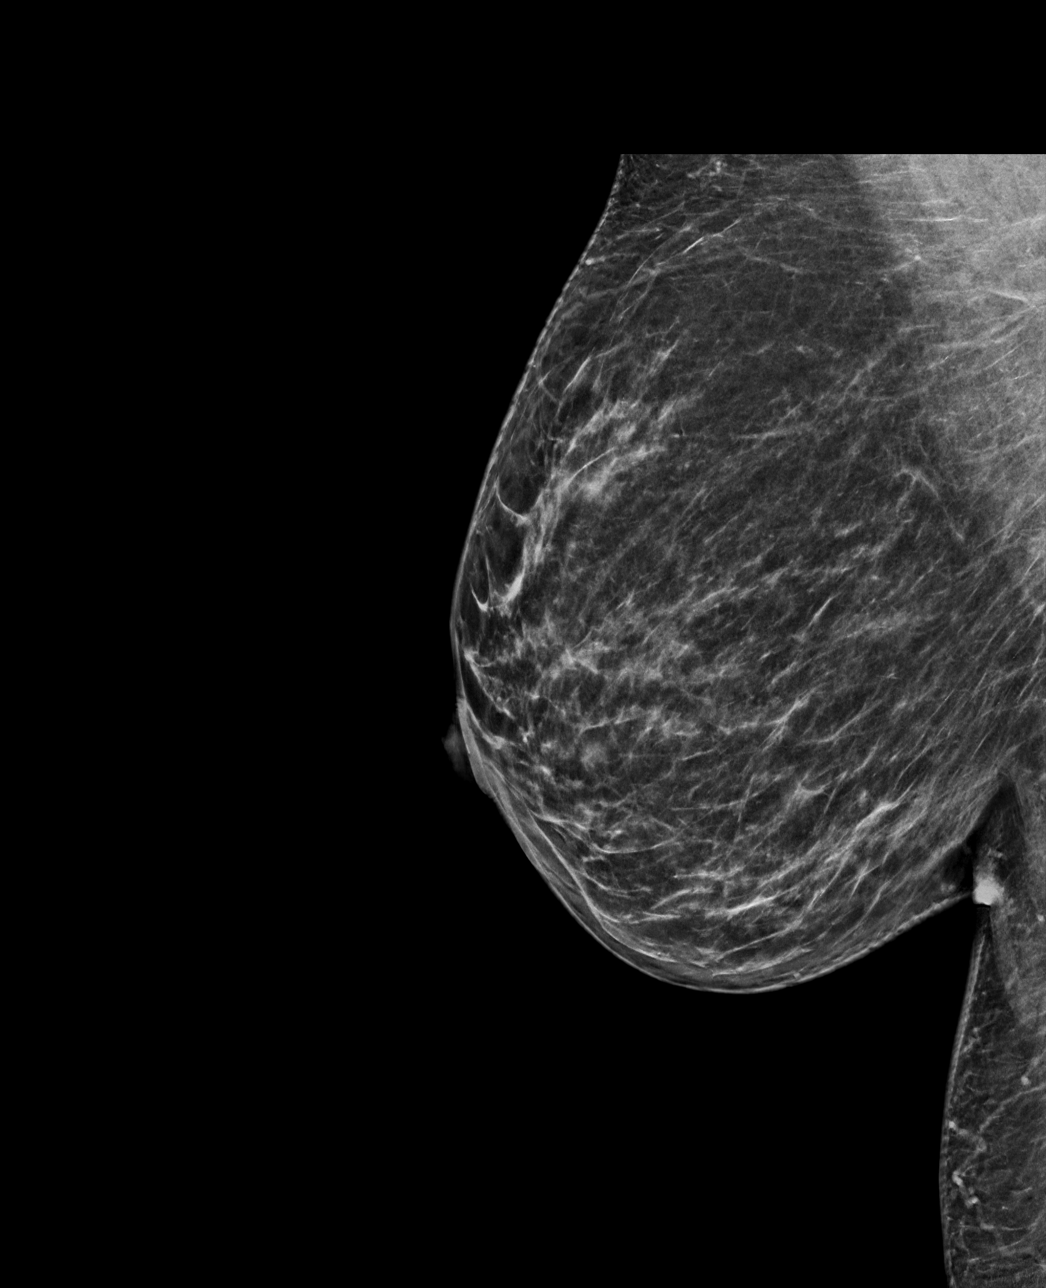

[R MLO tomo · tomo slice 35/69.0]
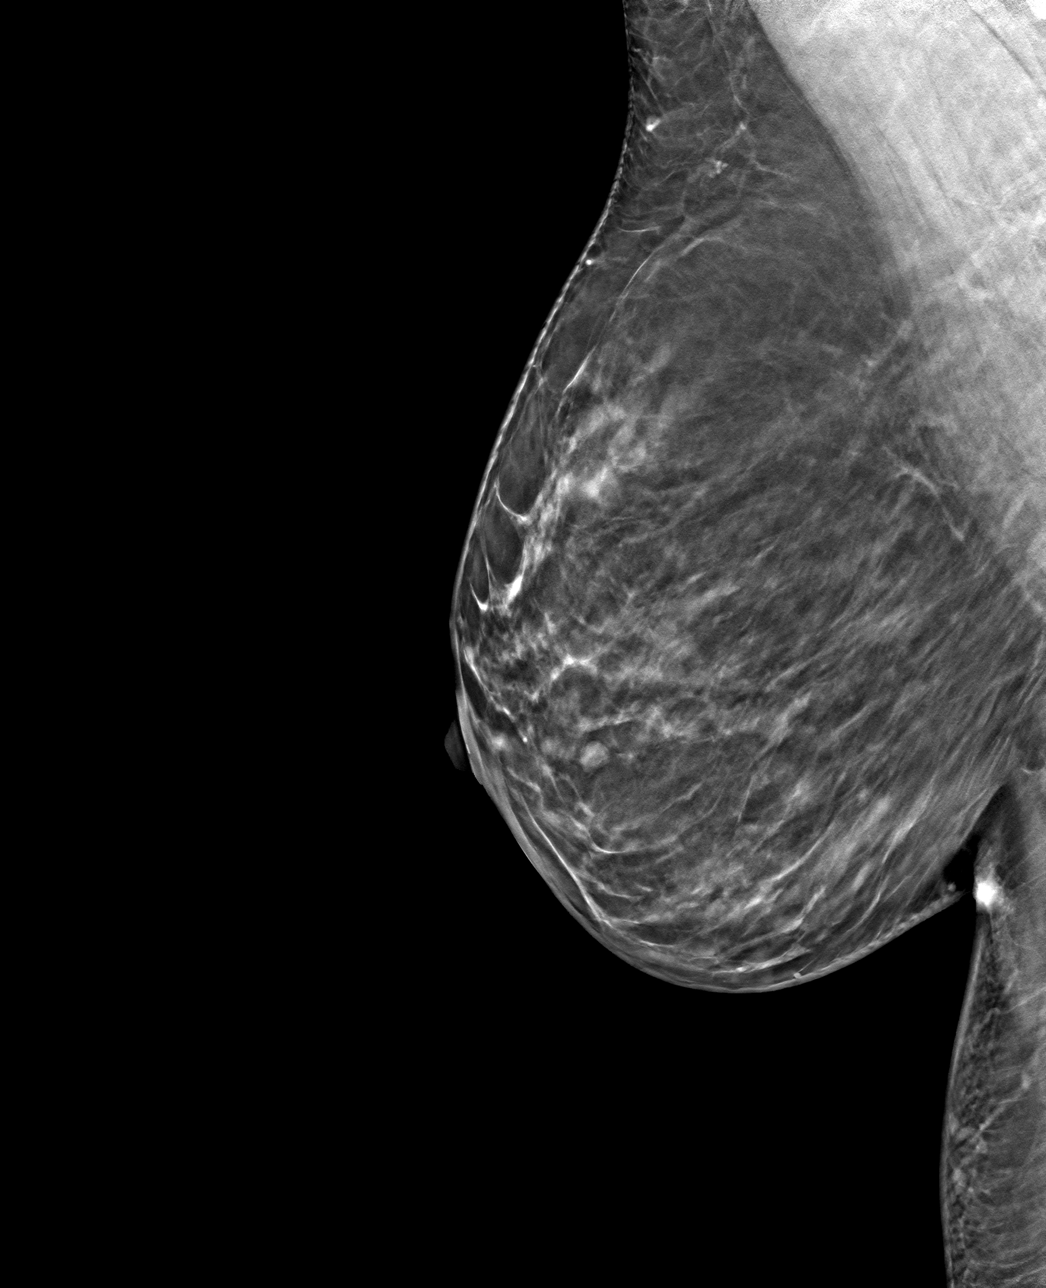

[R CC tomo · tomo slice 32/63.0]
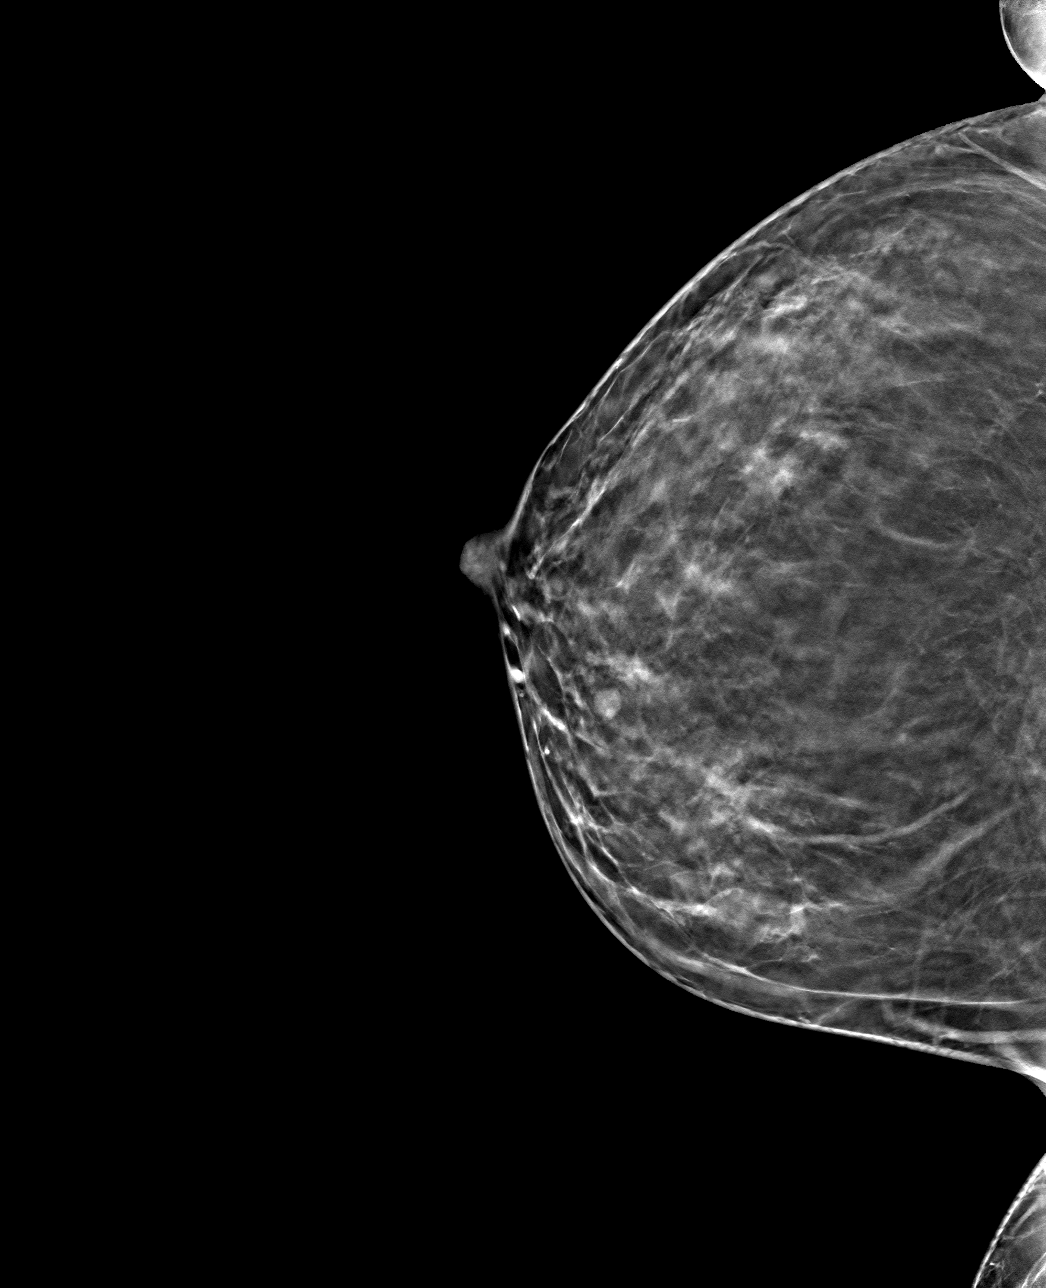

[4 of 12 positions shown; findings below may reference images not displayed]

ACR Breast Density Category b: There are scattered areas of
fibroglandular density.
FINDINGS: Stable small, rounded, circumscribed mass in the anterior aspect of
the right breast, medially. No interval findings suspicious for
malignancy.

Mammographic images were processed with CAD.

Targeted ultrasound is performed, showing a 6 x 5 x 4 mm oval,
horizontally oriented, circumscribed, hypoechoic mass in the 2
o'clock position of the right breast, 1 cm from the nipple. This is
unchanged in size and appearance, measuring 7 x 5 x 4 mm on
03/28/2020.
IMPRESSION: Stable right breast probably benign mass, most likely representing a
benign fibroadenoma.

RECOMMENDATION:
Bilateral diagnostic mammogram and right breast ultrasound in 6
months. The option of ultrasound-guided core needle biopsy was also
discussed with the patient but not recommended at this time. She is
currently comfortable with the 6 month follow-up.

I have discussed the findings and recommendations with the patient.
If applicable, a reminder letter will be sent to the patient
regarding the next appointment.

BI-RADS CATEGORY  3: Probably benign.

## 2022-05-01 ENCOUNTER — Other Ambulatory Visit: Payer: Self-pay | Admitting: Cardiology

## 2022-05-03 ENCOUNTER — Ambulatory Visit (INDEPENDENT_AMBULATORY_CARE_PROVIDER_SITE_OTHER): Payer: 59 | Admitting: Cardiology

## 2022-05-03 ENCOUNTER — Encounter: Payer: Self-pay | Admitting: Cardiology

## 2022-05-03 VITALS — BP 140/92 | HR 72 | Ht 63.5 in | Wt 222.8 lb

## 2022-05-03 DIAGNOSIS — Z79899 Other long term (current) drug therapy: Secondary | ICD-10-CM | POA: Diagnosis not present

## 2022-05-03 DIAGNOSIS — Z7689 Persons encountering health services in other specified circumstances: Secondary | ICD-10-CM | POA: Diagnosis not present

## 2022-05-03 DIAGNOSIS — I471 Supraventricular tachycardia, unspecified: Secondary | ICD-10-CM

## 2022-05-03 MED ORDER — FLECAINIDE ACETATE 100 MG PO TABS: 100.0000 mg | ORAL_TABLET | Freq: Two times a day (BID) | ORAL | 3 refills | Status: DC

## 2022-05-03 MED ORDER — PROPRANOLOL HCL 10 MG PO TABS: 10.0000 mg | ORAL_TABLET | Freq: Every day | ORAL | 3 refills | Status: DC

## 2022-05-03 NOTE — Progress Notes (Signed)
Cardiology Office Note:    Date:  05/03/2022   ID:  Cassidy [name], DOB 09/16/1981, MRN [id number]  PCP:  Cassidy Bang, MD  Cardiologist:  Cassidy Salines, DO  Electrophysiologist:  None   Referring MD: Cassidy [name]*   " I am ok"  History of Present Illness:    Cassidy [name] is a 41 y.o. female with a hx of  antiphospholipid syndrome not on anticoagulation plans to see hematology today, PVCs, history of SVT and hyperlipidemia is here today for follow-up visit.   Of note the patient was diagnosed with antiphospholipid syndrome after she had a stillbirth and was tested.  She was recommended Coumadin at that time she declined and had not been on any anticoagulation and had not follow-up with hematology. She does have hypothyroidism and she sees endocrine for this.   I did see the patient on October 24, 2020 for the first time when she reported she had been experiencing significant palpitations and fluttering.  Thankfully she had not passed out but the fluttering was significant and she was concerned about this.  At the end of the visit I placed a monitor and recommend an echocardiogram.  She was able to get his testing done in the interim.   I did see the patient in November 13, 2020 at that time we discussed her monitor results which showed paroxysmal atrial tachycardia along with symptomatic PACs.  I started patient on metoprolol and she tells me that her symptoms were worse on metoprolol so stopped this medication.  I started patient on propanolol 20 mg twice a day, she did call reported that she was experiencing some diarrhea and stomach aches and pains on the propanolol recommended during the time that we switch her to Cardizem but she declined reporting that she has significant bad reaction with Cardizem which made her significantly tired.   I saw the patient on November 28, 2020 at that time she was still experiencing palpitations we will place the patient on flecainide 50 mg  twice a day and continue her propanolol 10 mg daily.   I saw the patient on December 18, 2020 at that time she had improvement on her flecainide.  She did have some dizziness I referred her to ENT which she had not had any appointments yet.  At her visit on May 01, 2021 at that time we continue her flecainide and propranolol.  Since I saw the patient she has had a GYN procedure.  She notes that she has been taking CBD to help with her stress level.  This has been helping.  But she is very to see how this medication is affecting her flecainide.  She is here today with her significant other.    Past Medical History:  Diagnosis Date   Acquired hypothyroidism    endocrinologist-- Cassidy Brothers NP ([facility] [place])   Antiphospholipid antibody syndrome [facility])    hematology/ oncology--- Cassidy Cassidy [name];  dx age 75 work-up done due to still birth (umbilical cord w/ clot)   Endometrial polyp    GERD (gastroesophageal reflux disease)    Irregular menses    Migraines    Mixed hyperlipidemia    PAT (paroxysmal atrial tachycardia) [facility])    cardiologist--- Cassidy [name];  long hx takes flecainide and propranolol;   event monitor 11-04-2020 symptomatic occasional PAC SVT likely PAT;  echo 10-29-2020 ef 6-65%   Postcoital bleeding    PSVT (paroxysmal supraventricular tachycardia) (HCC)    Restless leg syndrome  Uterine leiomyoma    Vertigo    Wears glasses     Past Surgical History:  Procedure Laterality Date   COLONOSCOPY WITH ESOPHAGOGASTRODUODENOSCOPY (EGD)  2021   DILATATION & CURETTAGE/HYSTEROSCOPY WITH MYOSURE N/A 01/15/2022   Procedure: DILATATION & CURETTAGE/HYSTEROSCOPY WITH MYOSURE;  Surgeon: Cassidy Bruins, MD;  Location: [place];  Service: Gynecology;  Laterality: N/A;   HYSTEROSCOPY WITH NOVASURE N/A 01/15/2022   Procedure: HYSTEROSCOPY WITH NOVASURE;  Surgeon: Cassidy Bruins, MD;  Location: [place];  Service: Gynecology;  Laterality: N/A;    TONSILLECTOMY     child    Current Medications: Current Meds  Medication Sig   acetaminophen (TYLENOL) 500 MG tablet Take 500 mg by mouth every 6 (six) hours as needed.   butalbital-acetaminophen-caffeine (FIORICET) 50-325-40 MG tablet Take 1 tablet by mouth 2 (two) times daily as needed for migraine.   levothyroxine (SYNTHROID) 175 MCG tablet Take 175 mcg by mouth daily before breakfast. Monday to Saturday ,  takes half tab on sunday's   OVER THE COUNTER MEDICATION as needed (sleep). 1/2 tablet at bedtime as needed for sleep   [DISCONTINUED] flecainide (TAMBOCOR) 100 MG tablet Take 1 tablet (100 mg total) by mouth 2 (two) times daily.   [DISCONTINUED] propranolol (INDERAL) 10 MG tablet Take 1 tablet (10 mg total) by mouth daily.     Allergies:   Statins, Benadryl [diphenhydramine], Ciprofloxacin, Nsaids, Other, Tolmetin, and Aspirin   Social History   Socioeconomic History   Marital status: Married    Spouse name: Not on file   Number of children: Not on file   Years of education: Not on file   Highest education level: Not on file  Occupational History   Not on file  Tobacco Use   Smoking status: Every Day    Years: 28.00    Types: Cigarettes   Smokeless tobacco: [name]   Tobacco comments:    01-13-2022  per pt smokes 5 cig per day and uses nicotine pouch's patient also vapes  Vaping Use   Vaping Use: Former   Devices: quit 2022  Substance and Sexual Activity   Alcohol use: Yes   Drug use: [name]   Sexual activity: Yes    Partners: Female    Birth control/protection: None    Comment: not in past 2 weeks  Other Topics Concern   Not on file  Social History Narrative   Not on file   Social Determinants of Health   Financial Resource Strain: Not on file  Food Insecurity: Not on file  Transportation Needs: Not on file  Physical Activity: Not on file  Stress: Not on file  Social Connections: Not on file     Family History: The patient's family history includes  Atrial fibrillation in her mother; Breast cancer in her maternal aunt and maternal grandmother; Healthy in her brother; Hyperlipidemia in her father and mother; Supraventricular tachycardia in her mother.  ROS:   Review of Systems  Constitution: Negative for decreased appetite, fever and weight gain.  HENT: Negative for congestion, ear discharge, hoarse voice and sore throat.   Eyes: Negative for discharge, redness, vision loss in right eye and visual halos.  Cardiovascular: Negative for chest pain, dyspnea on exertion, leg swelling, orthopnea and palpitations.  Respiratory: Negative for cough, hemoptysis, shortness of breath and snoring.   Endocrine: Negative for heat intolerance and polyphagia.  Hematologic/Lymphatic: Negative for bleeding problem. Does not bruise/bleed easily.  Skin: Negative for flushing, nail changes, rash and suspicious lesions.  Musculoskeletal: Negative for arthritis, joint pain, muscle cramps, myalgias, neck pain and stiffness.  Gastrointestinal: Negative for abdominal pain, bowel incontinence, diarrhea and excessive appetite.  Genitourinary: Negative for decreased libido, genital sores and incomplete emptying.  Neurological: Negative for brief paralysis, focal weakness, headaches and loss of balance.  Psychiatric/Behavioral: Negative for altered mental status, depression and suicidal ideas.  Allergic/Immunologic: Negative for HIV exposure and persistent infections.    EKGs/Labs/Other Studies Reviewed:    The following studies were reviewed today:   EKG:  The ekg ordered today demonstrates sinus rhythm Heart rate 72 bpm.  November 04, 2020 Transthoracic echocardiogram IMPRESSIONS   1. Left ventricular ejection fraction, by estimation, is 60 to 65%. The left ventricle has normal function. The left ventricle has no regional wall motion abnormalities. Left ventricular diastolic parameters were normal.   2. Right ventricular systolic function is normal. The right  ventricular size is normal. There is normal pulmonary artery systolic pressure. The estimated right ventricular systolic pressure is 81.4 mmHg.   3. The mitral valve is grossly normal. Trivial mitral valve regurgitation. No evidence of mitral stenosis.   4. The aortic valve is tricuspid. Aortic valve regurgitation is not visualized. No aortic stenosis is present.   5. The inferior vena cava is normal in size with greater than 50% respiratory variability, suggesting right atrial pressure of 3 mmHg.     The patient wore the monitor for 12 days 22 hours hours starting October 16, 2020. Indication: Palpitations   The minimum heart rate was 43 bpm, maximum heart rate was 169 bpm, and average heart rate was 81 bpm. Predominant underlying rhythm was Sinus Rhythm.    54 Supraventricular Tachycardia runs occurred, the run with the fastest interval lasting 8 beats with a maximum rate of 169 bpm (average 54 bpm); the run with the fastest interval was also the longest.   Premature atrial complexes were occasional (4.0%,57584). Premature Ventricular complexes were rare less than 1%.   No ventricular tachycardia, no pauses, No AV block and no atrial fibrillation present. 97 patient triggered events: One associated with supraventricular tachycardia and the remaining associated with premature atrial complexes sinus rhythm.   Conclusion: This study is remarkable for the following:                                  1. Symptomatic occasional premature atrial complex.                                  2. Supraventricular tachycardia which is likely atrial tachycardia with variable block.  Recent Labs: 01/15/2022: Hemoglobin 14.4; Platelets 248  Recent Lipid Panel    Component Value Date/Time   CHOL 214 (H) 02/28/2020 0907   TRIG 96 02/28/2020 0907   HDL 58 02/28/2020 0907   CHOLHDL 3.7 02/28/2020 0907   LDLCALC 139 (H) 02/28/2020 0907    Physical Exam:    VS:  BP (!) 140/92   Pulse 72   Ht 5' 3.5"  (1.613 m)   Wt 222 lb 12.8 oz (101.1 kg)   LMP 04/17/2022   SpO2 98%   BMI 38.85 kg/m     Wt Readings from Last 3 Encounters:  05/03/22 222 lb 12.8 oz (101.1 kg)  01/15/22 216 lb 11.2 oz (98.3 kg)  12/23/21 218 lb (98.9 kg)     GEN: Well nourished, well developed  in no acute distress HEENT: Normal NECK: No JVD; No carotid bruits LYMPHATICS: No lymphadenopathy CARDIAC: S1S2 noted,RRR, no murmurs, rubs, gallops RESPIRATORY:  Clear to auscultation without rales, wheezing or rhonchi  ABDOMEN: Soft, non-tender, non-distended, +bowel sounds, no guarding. EXTREMITIES: No edema, No cyanosis, no clubbing MUSCULOSKELETAL:  No deformity  SKIN: Warm and dry NEUROLOGIC:  Alert and oriented x 3, non-focal PSYCHIATRIC:  Normal affect, good insight  ASSESSMENT:    1. Medication management   2. Encounter to establish care with new doctor   3. High risk medication use   4. PAT   5. PSVT (paroxysmal supraventricular tachycardia) (HCC)    PLAN:     Paroxysmal SVT/PAT-will continue the flecainide and propranolol.  Get flecainide level today.  Mixed hyperlipidemia-she prefers diet modification.  The patient understands the need to lose weight with diet and exercise. We have discussed specific strategies for this.  The patient is in agreement with the above plan. The patient left the office in stable condition.  The patient will follow up in 1 year or sooner if needed.   Medication Adjustments/Labs and Tests Ordered: Current medicines are reviewed at length with the patient today.  Concerns regarding medicines are outlined above.  Orders Placed This Encounter  Procedures   Flecainide level   TSH+T4F+T3Free   Ambulatory Referral to Primary Care   EKG 12-Lead   Meds ordered this encounter  Medications   flecainide (TAMBOCOR) 100 MG tablet    Sig: Take 1 tablet (100 mg total) by mouth 2 (two) times daily.    Dispense:  180 tablet    Refill:  3   propranolol (INDERAL) 10 MG tablet     Sig: Take 1 tablet (10 mg total) by mouth daily.    Dispense:  90 tablet    Refill:  3    Patient Instructions  Medication Instructions:  Your physician recommends that you continue on your current medications as directed. Please refer to the Current Medication list given to you today.  *If you need a refill on your cardiac medications before your next appointment, please call your pharmacy*   Lab Work: TODAY: TSH, Flecainide If you have labs (blood work) drawn today and your tests are completely normal, you will receive your results only by: [place] (if you have MyChart) OR A paper copy in the mail If you have any lab test that is abnormal or we need to change your treatment, we will call you to review the results.   Testing/Procedures: None   Follow-Up: At [facility], you and your health needs are our priority.  As part of our continuing mission to provide you with exceptional heart care, we have created designated Provider Care Teams.  These Care Teams include your primary Cardiologist (physician) and Advanced Practice Providers (APPs -  Physician Assistants and Nurse Practitioners) who all work together to provide you with the care you need, when you need it.  We recommend signing up for the patient portal called "MyChart".  Sign up information is provided on this After Visit Summary.  MyChart is used to connect with patients for Virtual Visits (Telemedicine).  Patients are able to view lab/test results, encounter notes, upcoming appointments, etc.  Non-urgent messages can be sent to your provider as well.   To learn more about what you can do with MyChart, go to [email or web address].    Your next appointment:   12 month(s)  The format for your next appointment:   In Person  Provider:  Berniece Salines, DO     Other Instructions   Important Information About Sugar         Adopting a Healthy Lifestyle.  Know what a healthy weight is for you  (roughly BMI <25) and aim to maintain this   Aim for 7+ servings of fruits and vegetables daily   65-80+ fluid ounces of water or unsweet tea for healthy kidneys   Limit to max 1 drink of alcohol per day; avoid smoking/tobacco   Limit animal fats in diet for cholesterol and heart health - choose grass fed whenever available   Avoid highly processed foods, and foods high in saturated/trans fats   Aim for low stress - take time to unwind and care for your mental health   Aim for 150 min of moderate intensity exercise weekly for heart health, and weights twice weekly for bone health   Aim for 7-9 hours of sleep daily   When it comes to diets, agreement about the perfect plan isnt easy to find, even among the experts. Experts at the [employer] developed an idea known as the Healthy Eating Plate. Just imagine a plate divided into logical, healthy portions.   The emphasis is on diet quality:   Load up on vegetables and fruits - one-half of your plate: Aim for color and variety, and remember that potatoes dont count.   Go for whole grains - one-quarter of your plate: Whole wheat, barley, wheat berries, quinoa, oats, brown rice, and foods made with them. If you want pasta, go with whole wheat pasta.   Protein power - one-quarter of your plate: Fish, chicken, beans, and nuts are all healthy, versatile protein sources. Limit red meat.   The diet, however, does go beyond the plate, offering a few other suggestions.   Use healthy plant oils, such as olive, canola, soy, corn, sunflower and peanut. Check the labels, and avoid partially hydrogenated oil, which have unhealthy trans fats.   If youre thirsty, drink water. Coffee and tea are good in moderation, but skip sugary drinks and limit milk and dairy products to one or two daily servings.   The type of carbohydrate in the diet is more important than the amount. Some sources of carbohydrates, such as vegetables, fruits,  whole grains, and beans-are healthier than others.   Finally, stay active  Signed, Cassidy Salines, DO  05/03/2022 10:33 AM    [place]

## 2022-05-03 NOTE — Patient Instructions (Signed)
Medication Instructions:  Your physician recommends that you continue on your current medications as directed. Please refer to the Current Medication list given to you today.  *If you need a refill on your cardiac medications before your next appointment, please call your pharmacy*   Lab Work: TODAY: TSH, Flecainide If you have labs (blood work) drawn today and your tests are completely normal, you will receive your results only by: [place] (if you have MyChart) OR A paper copy in the mail If you have any lab test that is abnormal or we need to change your treatment, we will call you to review the results.   Testing/Procedures: None   Follow-Up: At [facility], you and your health needs are our priority.  As part of our continuing mission to provide you with exceptional heart care, we have created designated Provider Care Teams.  These Care Teams include your primary Cardiologist (physician) and Advanced Practice Providers (APPs -  Physician Assistants and Nurse Practitioners) who all work together to provide you with the care you need, when you need it.  We recommend signing up for the patient portal called "MyChart".  Sign up information is provided on this After Visit Summary.  MyChart is used to connect with patients for Virtual Visits (Telemedicine).  Patients are able to view lab/test results, encounter notes, upcoming appointments, etc.  Non-urgent messages can be sent to your provider as well.   To learn more about what you can do with MyChart, go to [email or web address].    Your next appointment:   12 month(s)  The format for your next appointment:   In Person  Provider:   Berniece Salines, DO     Other Instructions   Important Information About Sugar

## 2022-05-11 LAB — TSH+T4F+T3FREE
Free T4: 1.59 ng/dL (ref 0.82–1.77)
T3, Free: 1.8 pg/mL — ABNORMAL LOW (ref 2.0–4.4)
TSH: 5.31 u[IU]/mL — ABNORMAL HIGH (ref 0.450–4.500)

## 2022-05-11 LAB — FLECAINIDE LEVEL: Flecainide: 0.57 ug/ml (ref 0.20–1.00)

## 2022-06-09 DIAGNOSIS — E039 Hypothyroidism, unspecified: Secondary | ICD-10-CM | POA: Diagnosis not present

## 2022-06-10 ENCOUNTER — Ambulatory Visit
Admission: EM | Admit: 2022-06-10 | Discharge: 2022-06-10 | Disposition: A | Discharge status: Home / Self Care | Payer: 59 | Attending: Emergency Medicine | Admitting: Emergency Medicine

## 2022-06-10 DIAGNOSIS — J988 Other specified respiratory disorders: Secondary | ICD-10-CM | POA: Insufficient documentation

## 2022-06-10 DIAGNOSIS — R5383 Other fatigue: Secondary | ICD-10-CM | POA: Diagnosis not present

## 2022-06-10 DIAGNOSIS — B9789 Other viral agents as the cause of diseases classified elsewhere: Secondary | ICD-10-CM | POA: Diagnosis not present

## 2022-06-10 DIAGNOSIS — D6861 Antiphospholipid syndrome: Secondary | ICD-10-CM | POA: Insufficient documentation

## 2022-06-10 DIAGNOSIS — Z20822 Contact with and (suspected) exposure to covid-19: Secondary | ICD-10-CM | POA: Insufficient documentation

## 2022-06-10 LAB — RESP PANEL BY RT-PCR (FLU A&B, COVID) ARPGX2
Influenza A by PCR: NEGATIVE
Influenza B by PCR: NEGATIVE
SARS Coronavirus 2 by RT PCR: NEGATIVE

## 2022-06-10 MED ORDER — ONDANSETRON 4 MG PO TBDP: 4.0000 mg | ORAL_TABLET | Freq: Three times a day (TID) | ORAL | 0 refills | Status: DC | PRN

## 2022-06-10 NOTE — Discharge Instructions (Addendum)
The result of your viral PCR testing will be posted to your MyChart once it is complete, typically this takes 6 to 12 hours.    If your COVID-19 PCR test is positive, you will be contacted by phone.  Please discuss with the callback nurse whether or not you would benefit from antiviral therapy treatment for COVID-19.  If your influenza PCR test is positive, please discuss with the callback nurse whether or not you would benefit from antiviral therapy for influenza.    If both your COVID-19 and PCR tests are negative, then your illness is likely due to one of the many less serious illnesses that are circulating in our community right now.  Conservative care is recommended.  Remain at home until you are fever free for 24 hours.  I provided you with a note to return to work in 3 days.  The results of your complete blood count and metabolic panel will post to MyChart once they are complete, typically this takes 24 to 48 hours.  You will be contacted if there are any abnormal findings.  Zofran is safe to take while taking flecainide, I sent a prescription to your pharmacy.  Please follow-up in the next 5 to 7 days if you are not feeling any better.  Please go to the emergency room if your shortness of breath worsens, you begin to have irregular heartbeat, confusion or chest pain.

## 2022-06-10 NOTE — ED Provider Notes (Signed)
[facility]    CSN: [id number] Arrival date & time: 06/10/22  1123    HISTORY   Chief Complaint  Patient presents with   Fatigue   Headache   Cough   Nausea   Shortness of Breath   HPI Cassidy [name] is a pleasant, 41 y.o. female who presents to urgent care today. Patient complains of decreased energy for the past 6 days accompanied by dizziness, feeling lightheaded, headache, nausea, nonproductive cough accompanied by a metallic taste and shortness of breath with exertion.  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  EMR reviewed.  Patient has a history of antiphospholipid syndrome for which she has been advised to take Coumadin however patient has declined this recommendation.  Patient has a history of PACs, PVCs currently managed with flecainide and propranolol.  Patient also sees cardiology for hypothyroidism with which she was diagnosed at age 46, currently takes levothyroxine 175 mcg daily and has adequate TSH levels.  The history is provided by the patient.   Past Medical History:  Diagnosis Date   Acquired hypothyroidism    endocrinologist-- Lalla Brothers NP ([facility] [place])   Antiphospholipid antibody syndrome [facility])    hematology/ oncology--- dr Marin Olp;  dx age 69 work-up done due to still birth (umbilical cord w/ clot)   Endometrial polyp    GERD (gastroesophageal reflux disease)    Irregular menses    Migraines    Mixed hyperlipidemia    PAT (paroxysmal atrial tachycardia) [facility])    cardiologist--- dr Raliegh Ip. tobb;  long hx takes flecainide and propranolol;   event monitor 11-04-2020 symptomatic occasional PAC SVT likely PAT;  echo 10-29-2020 ef 6-65%   Postcoital bleeding    PSVT (paroxysmal supraventricular tachycardia) (HCC)    Restless leg syndrome    Uterine leiomyoma    Vertigo    Wears glasses    Patient Active Problem List   Diagnosis Date Noted    Atrial fibrillation ([place])    PAT (paroxysmal atrial tachycardia) (HCC)    Tachycardia    Restless leg syndrome    Morbid obesity ([place])    Mixed hyperlipidemia    GERD (gastroesophageal reflux disease)    Autoimmune disorder ([place])    Antiphospholipid antibody syndrome ([place])    Acquired hypothyroidism 09/03/2019   Past Surgical History:  Procedure Laterality Date   COLONOSCOPY WITH ESOPHAGOGASTRODUODENOSCOPY (EGD)  2021   DILATATION & CURETTAGE/HYSTEROSCOPY WITH MYOSURE N/A 01/15/2022   Procedure: DILATATION & CURETTAGE/HYSTEROSCOPY WITH MYOSURE;  Surgeon: Princess Bruins, MD;  Location: [place];  Service: Gynecology;  Laterality: N/A;   HYSTEROSCOPY WITH NOVASURE N/A 01/15/2022   Procedure: HYSTEROSCOPY WITH NOVASURE;  Surgeon: Princess Bruins, MD;  Location: [place];  Service: Gynecology;  Laterality: N/A;   TONSILLECTOMY     child   OB History     Gravida  1   Para      Term      Preterm      AB      Living  0      SAB      IAB      Ectopic      Multiple      Live Births  0          Home Medications    Prior to Admission medications   Medication Sig Start Date End Date Taking? Authorizing  Provider  acetaminophen (TYLENOL) 500 MG tablet Take 500 mg by mouth every 6 (six) hours as needed.    [provider]  butalbital-acetaminophen-caffeine (FIORICET) 50-325-40 MG tablet Take 1 tablet by mouth 2 (two) times daily as needed for migraine.    [provider]  flecainide (TAMBOCOR) 100 MG tablet Take 1 tablet (100 mg total) by mouth 2 (two) times daily. 05/03/22   Tobb, Godfrey Pick, DO  levothyroxine (SYNTHROID) 175 MCG tablet Take 175 mcg by mouth daily before breakfast. Monday to Saturday ,  takes half tab on sunday's 12/25/18   [provider]  Ross as needed (sleep). 1/2 tablet at bedtime as needed for sleep    [provider]  propranolol (INDERAL) 10 MG tablet  Take 1 tablet (10 mg total) by mouth daily. 05/03/22   Berniece Salines, DO    Family History Family History  Problem Relation Age of Onset   Hyperlipidemia Mother    Supraventricular tachycardia Mother    Atrial fibrillation Mother    Hyperlipidemia Father    Healthy Brother    Breast cancer Maternal Aunt    Breast cancer Maternal Grandmother    Social History Social History   Tobacco Use   Smoking status: Every Day    Years: 28.00    Types: Cigarettes   Smokeless tobacco: Never   Tobacco comments:    01-13-2022  per pt smokes 5 cig per day and uses nicotine pouch's patient also vapes  Vaping Use   Vaping Use: Former   Devices: quit 2022  Substance Use Topics   Alcohol use: Yes   Drug use: Never   Allergies   Statins, Benadryl [diphenhydramine], Ciprofloxacin, Nsaids, Other, Tolmetin, and Aspirin  Review of Systems Review of Systems Pertinent findings revealed after performing a 14 point review of systems has been noted in the history of present illness.  Physical Exam Triage Vital Signs ED Triage Vitals  Enc Vitals Group     BP 07/24/21 0827 (!) 147/82     Pulse Rate 07/24/21 0827 72     Resp 07/24/21 0827 18     Temp 07/24/21 0827 98.3 F (36.8 C)     Temp Source 07/24/21 0827 Oral     SpO2 07/24/21 0827 98 %     Weight --      Height --      Head Circumference --      Peak Flow --      Pain Score 07/24/21 0826 5     Pain Loc --      Pain Edu? --      Excl. in [place]? --   No data found.  Updated Vital Signs BP (!) 137/96 (BP Location: Right Arm)   Pulse 78   Temp 98.5 F (36.9 C) (Oral)   Resp 18   LMP 06/05/2022   SpO2 98%   Physical Exam Vitals and nursing note reviewed.  Constitutional:      General: She is not in acute distress.    Appearance: Normal appearance. She is not ill-appearing.  HENT:     Head: Normocephalic and atraumatic.     Salivary Glands: Right salivary gland is not diffusely enlarged or tender. Left salivary gland is not  diffusely enlarged or tender.     Right Ear: Tympanic membrane, ear canal and external ear normal. No drainage. No middle ear effusion. There is no impacted cerumen. Tympanic membrane is not erythematous or bulging.     Left Ear:  Tympanic membrane, ear canal and external ear normal. No drainage.  No middle ear effusion. There is no impacted cerumen. Tympanic membrane is not erythematous or bulging.     Nose: Nose normal. No nasal deformity, septal deviation, mucosal edema, congestion or rhinorrhea.     Right Turbinates: Not enlarged, swollen or pale.     Left Turbinates: Not enlarged, swollen or pale.     Right Sinus: No maxillary sinus tenderness or frontal sinus tenderness.     Left Sinus: No maxillary sinus tenderness or frontal sinus tenderness.     Mouth/Throat:     Lips: Pink. No lesions.     Mouth: Mucous membranes are moist. No oral lesions.     Pharynx: Oropharynx is clear. Uvula midline. No posterior oropharyngeal erythema or uvula swelling.     Tonsils: No tonsillar exudate. 0 on the right. 0 on the left.  Eyes:     General: Lids are normal.        Right eye: No discharge.        Left eye: No discharge.     Extraocular Movements: Extraocular movements intact.     Conjunctiva/sclera: Conjunctivae normal.     Right eye: Right conjunctiva is not injected.     Left eye: Left conjunctiva is not injected.  Neck:     Trachea: Trachea and phonation normal.  Cardiovascular:     Rate and Rhythm: Normal rate and regular rhythm.     Pulses: Normal pulses.     Heart sounds: Normal heart sounds. No murmur heard.    No friction rub. No gallop.  Pulmonary:     Effort: Pulmonary effort is normal. No accessory muscle usage, prolonged expiration or respiratory distress.     Breath sounds: Normal breath sounds. No stridor, decreased air movement or transmitted upper airway sounds. No decreased breath sounds, wheezing, rhonchi or rales.  Chest:     Chest wall: No tenderness.  Musculoskeletal:         General: Normal range of motion.     Cervical back: Normal range of motion and neck supple. Normal range of motion.  Lymphadenopathy:     Cervical: No cervical adenopathy.  Skin:    General: Skin is warm and dry.     Findings: No erythema or rash.  Neurological:     General: No focal deficit present.     Mental Status: She is alert and oriented to person, place, and time.  Psychiatric:        Mood and Affect: Mood normal.        Behavior: Behavior normal.     Visual Acuity Right Eye Distance:   Left Eye Distance:   Bilateral Distance:    Right Eye Near:   Left Eye Near:    Bilateral Near:     [name] / Diagnostics / Procedures:     Radiology No results found.  Procedures Procedures (including critical care time) EKG  Pending results:  Labs Reviewed  RESP PANEL BY RT-PCR (FLU A&B, COVID) ARPGX2  CBC WITH DIFFERENTIAL/PLATELET  COMPREHENSIVE METABOLIC PANEL    Medications Ordered in UC: Medications - No data to display  UC Diagnoses / Final Clinical Impressions(s)   I have reviewed the triage vital signs and the nursing notes.  Pertinent labs & imaging results that were available during my care of the patient were reviewed by me and considered in my medical decision making (see chart for details).    Final diagnoses:  Antiphospholipid syndrome ([place])  Fatigue, unspecified type  Viral respiratory illness   Physical exam today is unremarkable.  EKG is normal.  Patient tested for COVID and flu.  CBC and CMP also obtained due to patient reporting feeling shaky and weak.  Zofran provided for intermittent nausea.  Emergency precautions advised.  Return precautions advised.  ED Prescriptions     Medication Sig Dispense Auth. Provider   ondansetron (ZOFRAN-ODT) 4 MG disintegrating tablet Take 1 tablet (4 mg total) by mouth every 8 (eight) hours as needed for nausea or vomiting. 20 tablet Lynden Oxford Scales, PA-C      PDMP not reviewed this  encounter.  Pending results:  Labs Reviewed  RESP PANEL BY RT-PCR (FLU A&B, COVID) ARPGX2  CBC WITH DIFFERENTIAL/PLATELET  COMPREHENSIVE METABOLIC PANEL    Discharge Instructions:   Discharge Instructions      The result of your viral PCR testing will be posted to your MyChart once it is complete, typically this takes 6 to 12 hours.    If your COVID-19 PCR test is positive, you will be contacted by phone.  Please discuss with the callback nurse whether or not you would benefit from antiviral therapy treatment for COVID-19.  If your influenza PCR test is positive, please discuss with the callback nurse whether or not you would benefit from antiviral therapy for influenza.    If both your COVID-19 and PCR tests are negative, then your illness is likely due to one of the many less serious illnesses that are circulating in our community right now.  Conservative care is recommended.  Remain at home until you are fever free for 24 hours.  I provided you with a note to return to work in 3 days.  The results of your complete blood count and metabolic panel will post to MyChart once they are complete, typically this takes 24 to 48 hours.  You will be contacted if there are any abnormal findings.  Zofran is safe to take while taking flecainide, I sent a prescription to your pharmacy.  Please follow-up in the next 5 to 7 days if you are not feeling any better.  Please go to the emergency room if your shortness of breath worsens, you begin to have irregular heartbeat, confusion or chest pain.              Disposition Upon Discharge:  Condition: stable for discharge home  Patient presented with an acute illness with associated systemic symptoms and significant discomfort requiring urgent management. In my opinion, this is a condition that a prudent lay person (someone who possesses an average knowledge of health and medicine) may potentially expect to result in complications if not  addressed urgently such as respiratory distress, impairment of bodily function or dysfunction of bodily organs.   Routine symptom specific, illness specific and/or disease specific instructions were discussed with the patient and/or caregiver at length.   As such, the patient has been evaluated and assessed, work-up was performed and treatment was provided in alignment with urgent care protocols and evidence based medicine.  Patient/parent/caregiver has been advised that the patient may require follow up for further testing and treatment if the symptoms continue in spite of treatment, as clinically indicated and appropriate.  Patient/parent/caregiver has been advised to return to the [facility] or PCP if no better; to PCP or the Emergency Department if new signs and symptoms develop, or if the current signs or symptoms continue to change or worsen for further workup, evaluation and treatment as clinically indicated and  appropriate  The patient will follow up with their current PCP if and as advised. If the patient does not currently have a PCP we will assist them in obtaining one.   The patient may need specialty follow up if the symptoms continue, in spite of conservative treatment and management, for further workup, evaluation, consultation and treatment as clinically indicated and appropriate.   Patient/parent/caregiver verbalized understanding and agreement of plan as discussed.  All questions were addressed during visit.  Please see discharge instructions below for further details of plan.  This office note has been dictated using Museum/gallery curator.  Unfortunately, this method of dictation can sometimes lead to typographical or grammatical errors.  I apologize for your inconvenience in advance if this occurs.  Please do not hesitate to reach out to me if clarification is needed.      Lynden Oxford Scales, PA-C 06/10/22 1252

## 2022-06-10 NOTE — ED Triage Notes (Signed)
The patient states she has been having a lack of energy since Friday. She c/o headaches, nausea, cough (with metallic taste), and SOB with activity. 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   She states she has a hx of sun poisoning, and hypothyroidism.   Started: Friday  Home interventions: none

## 2022-06-11 LAB — COMPREHENSIVE METABOLIC PANEL
ALT: 13 IU/L (ref 0–32)
AST: 17 IU/L (ref 0–40)
Albumin/Globulin Ratio: 1.6 (ref 1.2–2.2)
Albumin: 4.5 g/dL (ref 3.9–4.9)
Alkaline Phosphatase: 62 IU/L (ref 44–121)
BUN/Creatinine Ratio: 11 (ref 9–23)
BUN: 10 mg/dL (ref 6–24)
Bilirubin Total: 0.6 mg/dL (ref 0.0–1.2)
CO2: 20 mmol/L (ref 20–29)
Calcium: 9.2 mg/dL (ref 8.7–10.2)
Chloride: 102 mmol/L (ref 96–106)
Creatinine, Ser: 0.93 mg/dL (ref 0.57–1.00)
Globulin, Total: 2.9 g/dL (ref 1.5–4.5)
Glucose: 92 mg/dL (ref 70–99)
Potassium: 4.7 mmol/L (ref 3.5–5.2)
Sodium: 141 mmol/L (ref 134–144)
Total Protein: 7.4 g/dL (ref 6.0–8.5)
eGFR: 79 mL/min/{1.73_m2} (ref >59)

## 2022-06-11 LAB — CBC WITH DIFFERENTIAL/PLATELET
Basophils Absolute: 0.1 10*3/uL (ref 0.0–0.2)
Basos: 1 %
EOS (ABSOLUTE): 0.3 10*3/uL (ref 0.0–0.4)
Eos: 2 %
Hematocrit: 46.6 % (ref 34.0–46.6)
Hemoglobin: 15.5 g/dL (ref 11.1–15.9)
Immature Grans (Abs): 0.1 10*3/uL (ref 0.0–0.1)
Immature Granulocytes: 1 %
Lymphocytes Absolute: 2.3 10*3/uL (ref 0.7–3.1)
Lymphs: 23 %
MCH: 31 pg (ref 26.6–33.0)
MCHC: 33.3 g/dL (ref 31.5–35.7)
MCV: 93 fL (ref 79–97)
Monocytes Absolute: 0.7 10*3/uL (ref 0.1–0.9)
Monocytes: 6 %
Neutrophils Absolute: 6.9 10*3/uL (ref 1.4–7.0)
Neutrophils: 67 %
Platelets: 301 10*3/uL (ref 150–450)
RBC: 5 x10E6/uL (ref 3.77–5.28)
RDW: 13 % (ref 11.7–15.4)
WBC: 10.2 10*3/uL (ref 3.4–10.8)

## 2022-06-16 ENCOUNTER — Inpatient Hospital Stay: Payer: 59 | Admitting: Family

## 2022-06-16 ENCOUNTER — Inpatient Hospital Stay: Payer: 59

## 2022-06-21 ENCOUNTER — Inpatient Hospital Stay: Payer: 59

## 2022-06-21 ENCOUNTER — Inpatient Hospital Stay: Payer: 59 | Admitting: Family

## 2022-07-12 ENCOUNTER — Other Ambulatory Visit: Payer: Self-pay | Admitting: Family

## 2022-07-12 DIAGNOSIS — D6861 Antiphospholipid syndrome: Secondary | ICD-10-CM

## 2022-07-13 ENCOUNTER — Inpatient Hospital Stay: Payer: 59 | Attending: Hematology & Oncology

## 2022-07-13 ENCOUNTER — Encounter: Payer: Self-pay | Admitting: Family

## 2022-07-13 ENCOUNTER — Inpatient Hospital Stay (HOSPITAL_BASED_OUTPATIENT_CLINIC_OR_DEPARTMENT_OTHER): Payer: 59 | Admitting: Family

## 2022-07-13 VITALS — BP 122/78 | HR 82 | Temp 98.7°F | Resp 18 | Wt 225.0 lb

## 2022-07-13 DIAGNOSIS — Z8759 Personal history of other complications of pregnancy, childbirth and the puerperium: Secondary | ICD-10-CM | POA: Insufficient documentation

## 2022-07-13 DIAGNOSIS — D509 Iron deficiency anemia, unspecified: Secondary | ICD-10-CM

## 2022-07-13 DIAGNOSIS — D6861 Antiphospholipid syndrome: Secondary | ICD-10-CM | POA: Insufficient documentation

## 2022-07-13 DIAGNOSIS — E559 Vitamin D deficiency, unspecified: Secondary | ICD-10-CM | POA: Diagnosis not present

## 2022-07-13 DIAGNOSIS — Z79899 Other long term (current) drug therapy: Secondary | ICD-10-CM | POA: Diagnosis not present

## 2022-07-13 DIAGNOSIS — R5383 Other fatigue: Secondary | ICD-10-CM | POA: Diagnosis not present

## 2022-07-13 LAB — CBC WITH DIFFERENTIAL (CANCER CENTER ONLY)
Abs Immature Granulocytes: 0.06 10*3/uL (ref 0.00–0.07)
Basophils Absolute: 0.1 10*3/uL (ref 0.0–0.1)
Basophils Relative: 1 %
Eosinophils Absolute: 0.3 10*3/uL (ref 0.0–0.5)
Eosinophils Relative: 3 %
HCT: 45.2 % (ref 36.0–46.0)
Hemoglobin: 15 g/dL (ref 12.0–15.0)
Immature Granulocytes: 1 %
Lymphocytes Relative: 21 %
Lymphs Abs: 2.4 10*3/uL (ref 0.7–4.0)
MCH: 30.9 pg (ref 26.0–34.0)
MCHC: 33.2 g/dL (ref 30.0–36.0)
MCV: 93.2 fL (ref 80.0–100.0)
Monocytes Absolute: 0.7 10*3/uL (ref 0.1–1.0)
Monocytes Relative: 6 %
Neutro Abs: 7.8 10*3/uL — ABNORMAL HIGH (ref 1.7–7.7)
Neutrophils Relative %: 68 %
Platelet Count: 279 10*3/uL (ref 150–400)
RBC: 4.85 MIL/uL (ref 3.87–5.11)
RDW: 13.8 % (ref 11.5–15.5)
WBC Count: 11.5 10*3/uL — ABNORMAL HIGH (ref 4.0–10.5)
nRBC: 0 % (ref 0.0–0.2)

## 2022-07-13 LAB — CMP (CANCER CENTER ONLY)
ALT: 14 U/L (ref 0–44)
AST: 14 U/L — ABNORMAL LOW (ref 15–41)
Albumin: 4.4 g/dL (ref 3.5–5.0)
Alkaline Phosphatase: 48 U/L (ref 38–126)
Anion gap: 11 (ref 5–15)
BUN: 18 mg/dL (ref 6–20)
CO2: 24 mmol/L (ref 22–32)
Calcium: 9.5 mg/dL (ref 8.9–10.3)
Chloride: 103 mmol/L (ref 98–111)
Creatinine: 1.12 mg/dL — ABNORMAL HIGH (ref 0.44–1.00)
GFR, Estimated: >60 mL/min (ref >60)
Glucose, Bld: 94 mg/dL (ref 70–99)
Potassium: 4.2 mmol/L (ref 3.5–5.1)
Sodium: 138 mmol/L (ref 135–145)
Total Bilirubin: 1 mg/dL (ref 0.3–1.2)
Total Protein: 7.5 g/dL (ref 6.5–8.1)

## 2022-07-13 LAB — FERRITIN: Ferritin: 20 ng/mL (ref 11–307)

## 2022-07-13 LAB — IRON AND IRON BINDING CAPACITY (CC-WL,HP ONLY)
Iron: 105 ug/dL (ref 28–170)
Saturation Ratios: 22 % (ref 10.4–31.8)
TIBC: 489 ug/dL — ABNORMAL HIGH (ref 250–450)
UIBC: 384 ug/dL (ref 148–442)

## 2022-07-13 LAB — LACTATE DEHYDROGENASE: LDH: 153 U/L (ref 98–192)

## 2022-07-13 NOTE — Progress Notes (Signed)
Hematology and Oncology Follow Up Visit  TIAJUANA LEPPANEN [id number] 1980/11/22 41 y.o. 07/13/2022   Principle Diagnosis:  Antiphospholipid antibody syndrome with history of miscarriage  Current Therapy:   Observation   Interim History:  Ms. Holstine is here today for follow-up. She is symptomatic with fatigue and also has a cough and states that her phlegm tastes metallic. She occasionally has light blood tinge with her cough.  Her cycles is now regular with lighter flow. No other blood loss noted.   No fever, chills, n/v, rash, dizziness, SOB, chest pain, palpitations, abdominal pain or changes in bowel or bladder habits.  She varies between constipation and diarrhea and has some abdominal bloating at times.  No swelling, tenderness, numbness or tingling in her extremities. She has occasional puffiness in her hands that goes away at night. She states that she avoids salt as a trigger.  No falls or syncope reported.  Appetite and hydration are good. Weight is stable at 225 lbs.   ECOG Performance Status: 1 - Symptomatic but completely ambulatory  Medications:  Allergies as of 07/13/2022       Reactions   Statins    Muscle aches   Benadryl [diphenhydramine] Other (See Comments)   "restless leg, so I need gabapentin with it"   Ciprofloxacin    Flu-like symptoms with muscle aches   Nsaids Hives   PER PT THIS INCLUDES ALL TYPES OF NSAIDS THAT CAUSE HIVES   Other    Per pt paprika causes tachycardia   Tolmetin Hives   Aspirin Hives        Medication List        Accurate as of July 13, 2022  8:48 AM. If you have any questions, ask your nurse or doctor.          acetaminophen 500 MG tablet Commonly known as: TYLENOL Take 500 mg by mouth every 6 (six) hours as needed.   butalbital-acetaminophen-caffeine 50-325-40 MG tablet Commonly known as: FIORICET Take 1 tablet by mouth 2 (two) times daily as needed for migraine.   flecainide 100 MG tablet Commonly known as:  TAMBOCOR Take 1 tablet (100 mg total) by mouth 2 (two) times daily.   levothyroxine 175 MCG tablet Commonly known as: SYNTHROID Take 175 mcg by mouth daily before breakfast. Monday to Saturday ,  takes half tab on sunday's   Melatonin 2.5 MG Chew Chew 5 mg by mouth at bedtime.   ondansetron 4 MG disintegrating tablet Commonly known as: ZOFRAN-ODT Take 1 tablet (4 mg total) by mouth every 8 (eight) hours as needed for nausea or vomiting.   OVER THE COUNTER MEDICATION as needed (sleep). 1/2 tablet at bedtime as needed for sleep   propranolol 10 MG tablet Commonly known as: INDERAL Take 1 tablet (10 mg total) by mouth daily.        Allergies:  Allergies  Allergen Reactions   Statins     Muscle aches   Benadryl [Diphenhydramine] Other (See Comments)    "restless leg, so I need gabapentin with it"   Ciprofloxacin     Flu-like symptoms with muscle aches   Nsaids Hives    PER PT THIS INCLUDES ALL TYPES OF NSAIDS THAT CAUSE HIVES   Other     Per pt paprika causes tachycardia   Tolmetin Hives   Aspirin Hives    Past Medical History, Surgical history, Social history, and Family History were reviewed and updated.  Review of Systems: All other 10 point review of systems is  negative.   Physical Exam:  weight is 225 lb (102.1 kg). Her oral temperature is 98.7 F (37.1 C). Her blood pressure is 122/78 and her pulse is 82. Her respiration is 18 and oxygen saturation is 100%.   Wt Readings from Last 3 Encounters:  07/13/22 225 lb (102.1 kg)  05/03/22 222 lb 12.8 oz (101.1 kg)  01/15/22 216 lb 11.2 oz (98.3 kg)    Ocular: Sclerae unicteric, pupils equal, round and reactive to light Ear-nose-throat: Oropharynx clear, dentition fair Lymphatic: No cervical or supraclavicular adenopathy Lungs no rales or rhonchi, good excursion bilaterally Heart regular rate and rhythm, no murmur appreciated Abd soft, nontender, positive bowel sounds MSK no focal spinal tenderness, no joint  edema Neuro: non-focal, well-oriented, appropriate affect Breasts: Deferred   Lab Results  Component Value Date   WBC 11.5 (H) 07/13/2022   HGB 15.0 07/13/2022   HCT 45.2 07/13/2022   MCV 93.2 07/13/2022   PLT 279 07/13/2022   No results found for: "FERRITIN", "IRON", "TIBC", "UIBC", "IRONPCTSAT" Lab Results  Component Value Date   RBC 4.85 07/13/2022   No results found for: "KPAFRELGTCHN", "LAMBDASER", "KAPLAMBRATIO" No results found for: "IGGSERUM", "IGA", "IGMSERUM" No results found for: "TOTALPROTELP", "ALBUMINELP", "A1GS", "A2GS", "BETS", "BETA2SER", "GAMS", "MSPIKE", "SPEI"   Chemistry      Component Value Date/Time   NA 141 06/10/2022 1303   K 4.7 06/10/2022 1303   CL 102 06/10/2022 1303   CO2 20 06/10/2022 1303   BUN 10 06/10/2022 1303   CREATININE 0.93 06/10/2022 1303   CREATININE 0.84 11/13/2020 0916      Component Value Date/Time   CALCIUM 9.2 06/10/2022 1303   ALKPHOS 62 06/10/2022 1303   AST 17 06/10/2022 1303   AST 14 (L) 11/13/2020 0916   ALT 13 06/10/2022 1303   ALT 10 11/13/2020 0916   BILITOT 0.6 06/10/2022 1303   BILITOT 0.8 11/13/2020 0916       Impression and Plan: Ms. Hollick is a very pleasant 41 yo caucasian female with history of antiphospholipid antibody syndrome discovered after she experience a still birth 18 years ago. She refused Coumadin at that time. So far, she has not had another thrombotic event.  She is allergic to aspirin.  We will get iron studies and a vitamin D level on her to see if these are low and contributing to her fatigue.  Follow-up in 1 year.   Lottie Dawson, NP 10/17/20238:48 AM

## 2022-07-14 ENCOUNTER — Inpatient Hospital Stay: Payer: 59

## 2022-07-14 DIAGNOSIS — Z8759 Personal history of other complications of pregnancy, childbirth and the puerperium: Secondary | ICD-10-CM | POA: Diagnosis not present

## 2022-07-14 DIAGNOSIS — Z79899 Other long term (current) drug therapy: Secondary | ICD-10-CM | POA: Diagnosis not present

## 2022-07-14 DIAGNOSIS — R5383 Other fatigue: Secondary | ICD-10-CM | POA: Diagnosis not present

## 2022-07-14 DIAGNOSIS — E559 Vitamin D deficiency, unspecified: Secondary | ICD-10-CM

## 2022-07-14 DIAGNOSIS — D6861 Antiphospholipid syndrome: Secondary | ICD-10-CM | POA: Diagnosis not present

## 2022-07-14 DIAGNOSIS — D509 Iron deficiency anemia, unspecified: Secondary | ICD-10-CM

## 2022-07-16 ENCOUNTER — Telehealth: Payer: Self-pay

## 2022-07-16 ENCOUNTER — Other Ambulatory Visit: Payer: Self-pay | Admitting: Family

## 2022-07-16 DIAGNOSIS — E559 Vitamin D deficiency, unspecified: Secondary | ICD-10-CM

## 2022-07-16 LAB — VITAMIN D 25 HYDROXY (VIT D DEFICIENCY, FRACTURES)

## 2022-07-16 MED ORDER — ERGOCALCIFEROL 1.25 MG (50000 UT) PO CAPS: 50000.0000 [IU] | ORAL_CAPSULE | ORAL | 4 refills | Status: DC

## 2022-07-16 NOTE — Telephone Encounter (Signed)
Per [name]: no iron needed but her vit d is low. I'm sending in a once a week capsule 50,000 units weekly :)   Called pt and advised her of above. Pt stated understanding.

## 2022-07-19 ENCOUNTER — Telehealth: Payer: Self-pay | Admitting: *Deleted

## 2022-07-19 NOTE — Telephone Encounter (Signed)
Per 07/13/22 los - called and gave upcoming appointments - confirmed

## 2022-07-29 ENCOUNTER — Ambulatory Visit: Payer: Self-pay

## 2022-07-29 NOTE — Patient Outreach (Signed)
  Care Coordination   Initial Visit Note   07/29/2022 Name: Cassidy [name] MRN: [id number] DOB: January 10, 1981  Cassidy [name] is a 41 y.o. year old female who sees Nicolette Bang, MD for primary care. I spoke with  [name] by phone today.  What matters to the patients health and wellness today?  I want to manage my autoimmune disorder to prevent flare ups    Goals Addressed             This Visit's Progress    COMPLETED: Care Coordination Activities       Care Coordination Interventions: SDoH screening performed - no acute resource challenges identified at this time Discussed the patient changed health plans this year and her bills are being filed incorrectly - provided the patient with the contact number to the [facility] Department Reviewed the patient recently had her Vitamin D level checked and result was 5.7 Discussed the patient is on her second week of treatment and feeling much better Education provided on the role of the care coordination team - patient interested in speaking with [place] Referral placed to [place] for patient follow up         SDOH assessments and interventions completed:  Yes  SDOH Interventions Today    [place] Most Recent Value  SDOH Interventions   Food Insecurity Interventions Intervention Not Indicated  Housing Interventions Intervention Not Indicated  Transportation Interventions Intervention Not Indicated  Utilities Interventions Intervention Not Indicated        Care Coordination Interventions Activated:  Yes  Care Coordination Interventions:  Yes, provided   Follow up plan: Referral made to [place]    Encounter Outcome:  Pt. Visit Completed   Nadara Eaton, CDP Social Worker, Certified Dementia Practitioner [facility] Care Management  Care Coordination [phone]

## 2022-07-29 NOTE — Patient Instructions (Signed)
Visit Information  Thank you for taking time to visit with me today. Please don't hesitate to contact me if I can be of assistance to you.   Following are the goals we discussed today:   Goals Addressed             This Visit's Progress    COMPLETED: Care Coordination Activities       Care Coordination Interventions: SDoH screening performed - no acute resource challenges identified at this time Discussed the patient changed health plans this year and her bills are being filed incorrectly - provided the patient with the contact number to the [facility] Department Reviewed the patient recently had her Vitamin D level checked and result was 5.7 Discussed the patient is on her second week of treatment and feeling much better Education provided on the role of the care coordination team - patient interested in speaking with [place] Referral placed to [place] for patient follow up         If you are experiencing a [place] or [place] or need someone to talk to, please call 1-800-273-TALK (toll free, 24 hour hotline)  Patient verbalizes understanding of instructions and care plan provided today and agrees to view in [place]. Active MyChart status and patient understanding of how to access instructions and care plan via MyChart confirmed with patient.     No further follow up required: You will be contacted by our [place]. Please contact me as needed.  Daneen Schick, BSW, CDP Social Worker, Certified Dementia Practitioner [place] Management  Care Coordination [phone]

## 2022-08-11 ENCOUNTER — Ambulatory Visit: Payer: Self-pay

## 2022-08-11 NOTE — Patient Outreach (Signed)
  Care Coordination   Initial Visit Note   08/11/2022 Name: Cassidy [name] MRN: [id number] DOB: 01/09/1981  Cassidy [name] is a 41 y.o. year old female who sees Cassidy Bang, MD for primary care. I spoke with  Cassidy [name] by phone today.  What matters to the patients health and wellness today?  Patient would like to improve her Vitamin d level.     Goals Addressed               This Visit's Progress     Patient Stated     I want to improve my Vitamin D level (pt-stated)        Care Coordination Interventions: Evaluation of current treatment plan related to Vitamin D deficiency and patient's adherence to plan as established by provider Review of patient status, including review of consultant's reports, relevant laboratory and other test results, and medications completed. Educated patient about the basic disease process related to thyroid disease and vitamin d deficiency Encouraged patient to try to get at least 15 minutes of natural sunlight when possible and to add vitamin d rich foods to her diet Discussed the importance of keeping lab follow up visit to recheck Vitamin d, usually recommended for 12 weeks following the start of vitamin d supplementation  Educated patient on target goal for vitamin d for optimal health           SDOH assessments and interventions completed:  No     Care Coordination Interventions Activated:  Yes  Care Coordination Interventions:  Yes, provided   Follow up plan: Follow up call scheduled for 10/06/22    Encounter Outcome:  Pt. Visit Completed

## 2022-08-11 NOTE — Patient Instructions (Signed)
Visit Information  Thank you for taking time to visit with me today. Please don't hesitate to contact me if I can be of assistance to you.   Following are the goals we discussed today:   Goals Addressed               This Visit's Progress     Patient Stated     I want to improve my Vitamin D level (pt-stated)        Care Coordination Interventions: Evaluation of current treatment plan related to Vitamin D deficiency and patient's adherence to plan as established by provider Review of patient status, including review of consultant's reports, relevant laboratory and other test results, and medications completed. Educated patient about the basic disease process related to thyroid disease and vitamin d deficiency Encouraged patient to try to get at least 15 minutes of natural sunlight when possible and to add vitamin d rich foods to her diet Discussed the importance of keeping lab follow up visit to recheck Vitamin d, usually recommended for 12 weeks following the start of vitamin d supplementation  Educated patient on target goal for vitamin d for optimal health           Our next appointment is by telephone on 10/06/22 at 10:30 AM  Please call the care guide team at [phone] if you need to cancel or reschedule your appointment.   If you are experiencing a Mental Health or [place] or need someone to talk to, please call 1-800-273-TALK (toll free, 24 hour hotline)  Patient verbalizes understanding of instructions and care plan provided today and agrees to view in [place]. Active MyChart status and patient understanding of how to access instructions and care plan via MyChart confirmed with patient.     Barb Merino, RN, BSN, CCM Care Management Coordinator [facility] Care Management  Direct Phone: [phone]

## 2022-08-14 ENCOUNTER — Ambulatory Visit: Admit: 2022-08-14 | Payer: 59

## 2022-09-22 ENCOUNTER — Other Ambulatory Visit: Payer: Self-pay

## 2022-09-22 ENCOUNTER — Emergency Department (HOSPITAL_BASED_OUTPATIENT_CLINIC_OR_DEPARTMENT_OTHER)
Admission: EM | Admit: 2022-09-22 | Discharge: 2022-09-22 | Disposition: A | Discharge status: Home / Self Care | Payer: 59 | Attending: Emergency Medicine | Admitting: Emergency Medicine

## 2022-09-22 ENCOUNTER — Emergency Department (HOSPITAL_BASED_OUTPATIENT_CLINIC_OR_DEPARTMENT_OTHER): Payer: 59

## 2022-09-22 ENCOUNTER — Ambulatory Visit: Admission: EM | Admit: 2022-09-22 | Discharge: 2022-09-22 | Discharge status: Absent without leave | Payer: 59

## 2022-09-22 DIAGNOSIS — E039 Hypothyroidism, unspecified: Secondary | ICD-10-CM | POA: Insufficient documentation

## 2022-09-22 DIAGNOSIS — Z1152 Encounter for screening for COVID-19: Secondary | ICD-10-CM | POA: Diagnosis not present

## 2022-09-22 DIAGNOSIS — R0981 Nasal congestion: Secondary | ICD-10-CM | POA: Diagnosis present

## 2022-09-22 DIAGNOSIS — J069 Acute upper respiratory infection, unspecified: Secondary | ICD-10-CM | POA: Diagnosis not present

## 2022-09-22 DIAGNOSIS — R059 Cough, unspecified: Secondary | ICD-10-CM | POA: Diagnosis not present

## 2022-09-22 LAB — RESP PANEL BY RT-PCR (RSV, FLU A&B, COVID)  RVPGX2
Influenza A by PCR: NEGATIVE
Influenza B by PCR: NEGATIVE
Resp Syncytial Virus by PCR: NEGATIVE
SARS Coronavirus 2 by RT PCR: NEGATIVE

## 2022-09-22 MED ORDER — ALBUTEROL SULFATE HFA 108 (90 BASE) MCG/ACT IN AERS
2.0000 | INHALATION_SPRAY | RESPIRATORY_TRACT | Status: DC | PRN
  Administered 2022-09-22: 2 via RESPIRATORY_TRACT
  Filled 2022-09-22: qty 6.7

## 2022-09-22 MED ORDER — PREDNISONE 20 MG PO TABS: 40.0000 mg | ORAL_TABLET | Freq: Every day | ORAL | 0 refills | Status: DC

## 2022-09-22 NOTE — ED Notes (Signed)
Pt ambulated with RT on RA. Pt's O2 saturation remained 97-100% during ambulation.

## 2022-09-22 NOTE — ED Provider Notes (Signed)
[facility] EMERGENCY DEPARTMENT Provider Note   CSN: [id number] Arrival date & time: 09/22/22  0850     History  Chief Complaint  Patient presents with   Nasal Congestion    Cassidy [name] is a 41 y.o. female.  HPI Patient presents with shortness of breath and cough.  Has been for a few months now.  Says she saw a doctor in September and had been doing somewhat better but now worsened over the last week.  Had cough with occasional sputum production.  Feels more short of breath.  History of flecainide and propranolol for paroxysmal atrial tachycardia.  No swelling in her legs.  History of antiphospholipid antibody.  No fevers.  States she has been taking guaifenesin without relief.   Past Medical History:  Diagnosis Date   Acquired hypothyroidism    endocrinologist-- Lalla Brothers NP ([facility] [place])   Antiphospholipid antibody syndrome [facility])    hematology/ oncology--- dr Marin Olp;  dx age 26 work-up done due to still birth (umbilical cord w/ clot)   Endometrial polyp    GERD (gastroesophageal reflux disease)    Irregular menses    Migraines    Mixed hyperlipidemia    PAT (paroxysmal atrial tachycardia)    cardiologist--- dr Raliegh Ip. tobb;  long hx takes flecainide and propranolol;   event monitor 11-04-2020 symptomatic occasional PAC SVT likely PAT;  echo 10-29-2020 ef 6-65%   Postcoital bleeding    PSVT (paroxysmal supraventricular tachycardia)    Restless leg syndrome    Uterine leiomyoma    Vertigo    Wears glasses     Home Medications Prior to Admission medications   Medication Sig Start Date End Date Taking? Authorizing Provider  predniSONE (DELTASONE) 20 MG tablet Take 2 tablets (40 mg total) by mouth daily. 09/22/22  Yes Davonna Belling, MD  acetaminophen (TYLENOL) 500 MG tablet Take 500 mg by mouth every 6 (six) hours as needed.    [provider]  butalbital-acetaminophen-caffeine (FIORICET) 50-325-40 MG tablet Take 1 tablet by mouth 2 (two)  times daily as needed for migraine.    [provider]  ergocalciferol (VITAMIN D2) 1.25 MG (50000 UT) capsule Take 1 capsule (50,000 Units total) by mouth once a week. 07/16/22   Celso Amy, NP  flecainide (TAMBOCOR) 100 MG tablet Take 1 tablet (100 mg total) by mouth 2 (two) times daily. 05/03/22   Tobb, Godfrey Pick, DO  levothyroxine (SYNTHROID) 175 MCG tablet Take 175 mcg by mouth daily before breakfast. Monday to Saturday ,  takes half tab on sunday's 12/25/18   [provider]  Melatonin 2.5 MG CHEW Chew 5 mg by mouth at bedtime.    [provider]  ondansetron (ZOFRAN-ODT) 4 MG disintegrating tablet Take 1 tablet (4 mg total) by mouth every 8 (eight) hours as needed for nausea or vomiting. 06/10/22   Lynden Oxford Scales, PA-C  OVER THE COUNTER MEDICATION as needed (sleep). 1/2 tablet at bedtime as needed for sleep Patient not taking: Reported on 07/13/2022    [provider]  propranolol (INDERAL) 10 MG tablet Take 1 tablet (10 mg total) by mouth daily. 05/03/22   Tobb, Kardie, DO      Allergies    Statins, Benadryl [diphenhydramine], Ciprofloxacin, Nsaids, Other, Tolmetin, and Aspirin    Review of Systems   Review of Systems  Physical Exam Updated Vital Signs BP (!) 140/80 (BP Location: Right Arm)   Pulse 77   Temp 98.6 F (37 C) (Oral)   Resp  20   LMP  (LMP Unknown) Comment: reports no chance of preganancy  SpO2 100%  Physical Exam HENT:     Head: Atraumatic.  Cardiovascular:     Rate and Rhythm: Regular rhythm.  Pulmonary:     Breath sounds: No wheezing or rhonchi.     Comments: Mildly harsh breath sounds focal rales or rhonchi. Abdominal:     Tenderness: There is no abdominal tenderness.  Musculoskeletal:        General: No tenderness.  Skin:    General: Skin is warm.  Neurological:     Mental Status: She is alert and oriented to person, place, and time.     ED Results / Procedures / Treatments   Labs (all labs ordered are  listed, but only abnormal results are displayed) Labs Reviewed  RESP PANEL BY RT-PCR (RSV, FLU A&B, COVID)  RVPGX2    EKG None  Radiology DG Chest 2 View  Result Date: 09/22/2022 CLINICAL DATA:  Cough. EXAM: CHEST - 2 VIEW COMPARISON:  Feb 10, 2019. FINDINGS: The heart size and mediastinal contours are within normal limits. Both lungs are clear. The visualized skeletal structures are unremarkable. IMPRESSION: No active cardiopulmonary disease. Electronically Signed   By: Marijo Conception M.D.   On: 09/22/2022 09:48    Procedures Procedures    Medications Ordered in ED Medications  albuterol (VENTOLIN HFA) 108 (90 Base) MCG/ACT inhaler 2 puff (2 puffs Inhalation Given 09/22/22 1109)    ED Course/ Medical Decision Making/ A&P                           Medical Decision Making Amount and/or Complexity of Data Reviewed Radiology: ordered.  Risk Prescription drug management.   Shortness of breath with cough.  Has had for a month now.  He does not show pneumonia.  Flu COVID and RSV testing negative.  Not hypoxic.  Able to ambulate without hypoxia.  Doubt pulmonary embolism.  States she is urgent care but they could not give her medicines due to her flecainide.  Reviewed cardiology notes.  I think this is likely still related to upper respiratory infection.  Will treat with some steroids and inhaler.  Feeling better after treatment.  No arrhythmia.  Will discharge.        Final Clinical Impression(s) / ED Diagnoses Final diagnoses:  Upper respiratory tract infection, unspecified type    Rx / DC Orders ED Discharge Orders          Ordered    predniSONE (DELTASONE) 20 MG tablet  Daily        09/22/22 1132              Davonna Belling, MD 09/22/22 1530

## 2022-09-22 NOTE — ED Notes (Signed)
Dc instructions and scripts reviewed with pt no questions or concerns at this time. Will follow up with pcp as needed.

## 2022-09-22 NOTE — ED Triage Notes (Signed)
Patient presents to ED via POV from home. Here with nasal congestion x 1 month. Reports cough.

## 2022-10-06 ENCOUNTER — Ambulatory Visit: Payer: Self-pay

## 2022-10-06 NOTE — Patient Outreach (Signed)
  Care Coordination   10/06/2022 Name: ANGI GOODELL MRN: [id number] DOB: 03-Jun-1981   Care Coordination Outreach Attempts:  An unsuccessful telephone outreach was attempted for a scheduled appointment today.  Follow Up Plan:  Additional outreach attempts will be made to offer the patient care coordination information and services.   Encounter Outcome:  No Answer   Care Coordination Interventions:  No, not indicated    Barb Merino, RN, BSN, CCM Care Management Coordinator [facility] Care Management  Direct Phone: [phone]

## 2022-10-08 ENCOUNTER — Telehealth: Payer: Self-pay | Admitting: *Deleted

## 2022-10-08 NOTE — Progress Notes (Unsigned)
  [name] Coordination Note  10/08/2022 Name: LICHELLE VIETS MRN: [id number] DOB: 08-Feb-1981  Cassidy [name] is a 42 y.o. year old female who is a primary [name] patient of Pcp, No and is actively engaged with the [name] management team. I reached out to [name] by phone today to assist with re-scheduling an initial visit with the RN Case Manager  Follow up plan: Unsuccessful telephone outreach attempt made. A HIPAA compliant phone message was left for the patient providing contact information and requesting a return call.   [place]  Direct Dial: [phone]

## 2022-10-12 NOTE — Progress Notes (Signed)
  Care Coordination Note  10/12/2022 Name: SMRITI BARKOW MRN: [id number] DOB: March 08, 1981  TIMBER MARSHMAN is a 42 y.o. year old female who is a primary care patient of Pcp, No and is actively engaged with the care management team. I reached out to [name] by phone today to assist with re-scheduling an initial visit with the RN Case Manager  Follow up plan: A third unsuccessful telephone outreach attempt made. A HIPAA compliant phone message was left for the patient providing contact information and requesting a return call.  We have been unable to make contact with the patient for follow up. The care management team is available to follow up with the patient after provider conversation with the patient regarding recommendation for care management engagement and subsequent re-referral to the care management team.   [place]  Direct Dial: [phone]

## 2022-10-12 NOTE — Progress Notes (Signed)
  Care Coordination Note  10/12/2022 Name: Cassidy [name] MRN: [id number] DOB: 03-Jan-1981  [name] is a 42 y.o. year old female who is a primary care patient of Pcp, No and is actively engaged with the care management team. I reached out to [name] by phone today to assist with re-scheduling an initial visit with the RN Case Manager  Follow up plan: Telephone appointment with care management team member scheduled for:10/27/22 [place]  Direct Dial: [phone]

## 2022-10-26 ENCOUNTER — Telehealth: Payer: Self-pay | Admitting: *Deleted

## 2022-10-26 NOTE — Progress Notes (Unsigned)
  Care Coordination Note  10/26/2022 Name: Cassidy [name] MRN: [id number] DOB: 1981/07/01  Cassidy [name] is a 42 y.o. year old female who is a primary care patient of Pcp, No and is actively engaged with the care management team. I reached out to [name] by phone today to assist with re-scheduling a follow up visit with the RN Case Manager  Follow up plan: Unsuccessful telephone outreach attempt made. A HIPAA compliant phone message was left for the patient providing contact information and requesting a return call.  [place]  Direct Dial: [phone]

## 2022-10-27 NOTE — Progress Notes (Signed)
  Care Coordination Note  10/27/2022 Name: Cassidy [name] MRN: [id number] DOB: 03-11-1981  Cassidy [name] is a 42 y.o. year old female who is a primary care patient of Pcp, No and is actively engaged with the care management team. I reached out to [name] by phone today to assist with re-scheduling a follow up visit with the RN Case Manager  Follow up plan: Telephone appointment with care management team member scheduled for:11/04/22  [place]: [phone]

## 2022-11-04 ENCOUNTER — Ambulatory Visit: Payer: Self-pay

## 2022-11-04 NOTE — Patient Instructions (Signed)
Visit Information  Thank you for taking time to visit with me today. Please don't hesitate to contact me if I can be of assistance to you.   Following are the goals we discussed today:   Goals Addressed             This Visit's Progress    Patient Stated       Care Coordination Interventions: Patient interviewed about adult health maintenance status including the importance of re-establishing with a primary care provider  Educated patient on how to obtain a list of in network providers by contacting her insurance company to request this information Advised patient to discuss her recurrent respiratory symptoms with her primary care provider once established in order to have symptoms evaluated and or referral to Pulmonology           Our next appointment is by telephone on 01/03/23 at 09:00 AM  Please call the care guide team at [phone] if you need to cancel or reschedule your appointment.   If you are experiencing a Mental Health or [place] or need someone to talk to, please call 1-800-273-TALK (toll free, 24 hour hotline) go to [facility] [address], [place] [phone])  Patient verbalizes understanding of instructions and care plan provided today and agrees to view in [place]. Active MyChart status and patient understanding of how to access instructions and care plan via MyChart confirmed with patient.     Barb Merino, RN, BSN, CCM Care Management Coordinator [facility] Care Management  Direct Phone: [phone]

## 2022-11-04 NOTE — Patient Outreach (Signed)
  Care Coordination   Follow Up Visit Note   11/04/2022 Name: Cassidy [name] MRN: [id number] DOB: [birth year]  Cassidy [name] is a 42 y.o. year old female who sees Pcp, No for primary care. I spoke with  Cassidy [name] by phone today.  What matters to the patients health and wellness today?  Patient plans to call her health plan to request a list of in network primary care providers for re-establishment.     Goals Addressed             This Visit's Progress    Patient Stated       Care Coordination Interventions: Patient interviewed about adult health maintenance status including the importance of re-establishing with a primary care provider  Educated patient on how to obtain a list of in network providers by contacting her insurance company to request this information Advised patient to discuss her recurrent respiratory symptoms with her primary care provider once established in order to have symptoms evaluated and or referral to Pulmonology           SDOH assessments and interventions completed:  No     Care Coordination Interventions:  Yes, provided   Follow up plan: Follow up call scheduled for 01/03/23 '@09'$ :00 AM     Encounter Outcome:  Pt. Visit Completed

## 2023-01-17 ENCOUNTER — Ambulatory Visit: Payer: Self-pay

## 2023-01-17 NOTE — Patient Outreach (Signed)
  Care Coordination   Follow Up Visit Note   01/17/2023 Name: Cassidy [name] MRN: [id number] DOB: 06-01-1981  Cassidy [name] is a 42 y.o. year old female who sees Pcp, No for primary care. I spoke with  Bonnita Nasuti by phone today.  What matters to the patients health and wellness today?  Patient will locate a new PCP by accessing her online portal with [facility] provider search.     Goals Addressed             This Visit's Progress    To get established with a new primary care provider       Care Coordination Interventions: Patient interviewed about adult health maintenance status including  reestablishment with a PCP  Determined patient has not located a PCP for re-establishment of health maintenance  Educated patient about the benefits of having a PCP to help manage her health maintenance, perform yearly physicals and handle acute illness's that may arise Educated patient on how to access her [employer] member website in order to explore primary care providers in her network  Reviewed and discussed upcoming scheduled appointments with patient's Cardiologist and Endocrinologist Educated patient about the availability of [facility] to discuss alternative medicines       Interventions Today    Flowsheet Row Most Recent Value  Chronic Disease   Chronic disease during today's visit Other  [chronic inflammation]  General Interventions   General Interventions Discussed/Reviewed General Interventions Discussed, General Interventions Reviewed, Doctor Visits  Doctor Visits Discussed/Reviewed Doctor Visits Discussed, Doctor Visits Reviewed, PCP  Education Interventions   Education Provided Provided Education  Provided Verbal Education On Medication, When to see the doctor, Warehouse manager maintenance]  Pharmacy Interventions   Pharmacy Dicussed/Reviewed Pharmacy Topics Discussed, Pharmacy Topics Reviewed, Medications and their functions          SDOH assessments and interventions  completed:  No     Care Coordination Interventions:  Yes, provided   Follow up plan: Follow up call scheduled for 04/18/23 :30 AM    Encounter Outcome:  Pt. Visit Completed

## 2023-01-17 NOTE — Patient Instructions (Signed)
Visit Information  Thank you for taking time to visit with me today. Please don't hesitate to contact me if I can be of assistance to you.   Following are the goals we discussed today:   Goals Addressed             This Visit's Progress    To get established with a new primary care provider       Care Coordination Interventions: Patient interviewed about adult health maintenance status including  reestablishment with a PCP  Determined patient has not located a PCP for re-establishment of health maintenance  Educated patient about the benefits of having a PCP to help manage her health maintenance, perform yearly physicals and handle acute illness's that may arise Educated patient on how to access her [employer] member website in order to explore primary care providers in her network  Reviewed and discussed upcoming scheduled appointments with patient's Cardiologist and Endocrinologist Educated patient about the availability of [facility] to discuss alternative medicines           Our next appointment is by telephone on 04/18/23 at 09:30 AM   Please call the care guide team at [phone] if you need to cancel or reschedule your appointment.   If you are experiencing a Mental Health or Behavioral Health Crisis or need someone to talk to, please call 1-800-273-TALK (toll free, 24 hour hotline) go to [facility] [address] [place] [phone])  Patient verbalizes understanding of instructions and care plan provided today and agrees to view in MyChart. Active MyChart status and patient understanding of how to access instructions and care plan via MyChart confirmed with patient.     Delsa Sale, RN, BSN, CCM Care Management Coordination [facility] Care Management Direct Phone: [phone]

## 2023-01-27 ENCOUNTER — Telehealth: Payer: Self-pay | Admitting: Cardiology

## 2023-01-27 NOTE — Telephone Encounter (Signed)
Pt c/o BP issue: STAT if pt c/o blurred vision, one-sided weakness or slurred speech  1. What are your last 5 BP readings? 170/100 sitting 182/98 standing up)  2. Are you having any other symptoms (ex. Dizziness, headache, blurred vision, passed out)? Lightheadedness, dizziness, headache  3. What is your BP issue?

## 2023-01-27 NOTE — Telephone Encounter (Signed)
Patient states last night she felt lightheaded while changing positions. Her head has been pounding really bad. She has not taken anything for the headache. Last night at work she got really upset which made the pounding worse. She stated the last time she felt like this was when she had preeclampsia so when she got home her wife took her blood pressure manually and it was 170/100 sitting. She got up to walked and felt light headed so they took blood pressure again and it was 182/98. Her wife did orthostatic blood pressures while we were on the phone. Lying-182/100 hr-88 Sitting-178/112 hr-84 Standing-170/110 hr-90. She does not have a PCP currently. She does not want to go to ED. She states she has been relaxing so she feels fine currently. She has appointment with Irving Burton tomorrow in office. Advised she continue to monitor blood pressures and record them for her visit tomorrow. Stay hydrated and to avoid any stress triggers.  Spoke with DOD Dr. Wyline Mood and she agrees with advise.

## 2023-01-28 ENCOUNTER — Ambulatory Visit: Payer: 59 | Attending: Nurse Practitioner | Admitting: Nurse Practitioner

## 2023-01-28 ENCOUNTER — Other Ambulatory Visit: Payer: Self-pay

## 2023-01-28 ENCOUNTER — Encounter: Payer: Self-pay | Admitting: Nurse Practitioner

## 2023-01-28 VITALS — BP 130/92 | HR 62 | Ht 63.0 in | Wt 232.2 lb

## 2023-01-28 DIAGNOSIS — R002 Palpitations: Secondary | ICD-10-CM | POA: Diagnosis not present

## 2023-01-28 DIAGNOSIS — E039 Hypothyroidism, unspecified: Secondary | ICD-10-CM

## 2023-01-28 DIAGNOSIS — E782 Mixed hyperlipidemia: Secondary | ICD-10-CM | POA: Diagnosis not present

## 2023-01-28 DIAGNOSIS — F419 Anxiety disorder, unspecified: Secondary | ICD-10-CM | POA: Diagnosis not present

## 2023-01-28 DIAGNOSIS — I471 Supraventricular tachycardia, unspecified: Secondary | ICD-10-CM

## 2023-01-28 DIAGNOSIS — D6861 Antiphospholipid syndrome: Secondary | ICD-10-CM

## 2023-01-28 DIAGNOSIS — I10 Essential (primary) hypertension: Secondary | ICD-10-CM | POA: Diagnosis not present

## 2023-01-28 MED ORDER — OLMESARTAN MEDOXOMIL 20 MG PO TABS: 20.0000 mg | ORAL_TABLET | Freq: Every day | ORAL | 3 refills | Status: DC

## 2023-01-28 NOTE — Progress Notes (Signed)
Office Visit    Patient Name: Cassidy [name] Date of Encounter: 01/28/2023  Primary Care Provider:  Pcp, No Primary Cardiologist:  Thomasene Ripple, DO  Chief Complaint    42 year old female with a history of SVT, PVCs, hypertension, hyperlipidemia, hypothyroidism, antiphospholipid syndrome, migraines, and GERD who presents for follow-up related to hypertension.   Past Medical History    Past Medical History:  Diagnosis Date   Acquired hypothyroidism    endocrinologist-- Rudi Heap NP ([facility] [place])   Antiphospholipid antibody syndrome [facility])    hematology/ oncology--- dr Myna Hidalgo;  dx age 86 work-up done due to still birth (umbilical cord w/ clot)   Endometrial polyp    GERD (gastroesophageal reflux disease)    Irregular menses    Migraines    Mixed hyperlipidemia    PAT (paroxysmal atrial tachycardia)    cardiologist--- dr Kirtland Bouchard. tobb;  long hx takes flecainide and propranolol;   event monitor 11-04-2020 symptomatic occasional PAC SVT likely PAT;  echo 10-29-2020 ef 6-65%   Postcoital bleeding    PSVT (paroxysmal supraventricular tachycardia)    Restless leg syndrome    Uterine leiomyoma    Vertigo    Wears glasses    Past Surgical History:  Procedure Laterality Date   COLONOSCOPY WITH ESOPHAGOGASTRODUODENOSCOPY (EGD)  2021   DILATATION & CURETTAGE/HYSTEROSCOPY WITH MYOSURE N/A 01/15/2022   Procedure: DILATATION & CURETTAGE/HYSTEROSCOPY WITH MYOSURE;  Surgeon: Genia Del, MD;  Location: [facility];  Service: Gynecology;  Laterality: N/A;   HYSTEROSCOPY WITH NOVASURE N/A 01/15/2022   Procedure: HYSTEROSCOPY WITH NOVASURE;  Surgeon: Genia Del, MD;  Location: [facility] ;  Service: Gynecology;  Laterality: N/A;   TONSILLECTOMY     child    Allergies  Allergies  Allergen Reactions   Statins     Muscle aches   Benadryl [Diphenhydramine] Other (See Comments)    "restless leg, so I need gabapentin with it"   Ciprofloxacin      Flu-like symptoms with muscle aches   Nsaids Hives    PER PT THIS INCLUDES ALL TYPES OF NSAIDS THAT CAUSE HIVES   Other     Per pt paprika causes tachycardia   Tolmetin Hives   Aspirin Hives     Labs/Other Studies Reviewed    The following studies were reviewed today:  November 04, 2020 Transthoracic echocardiogram: IMPRESSIONS   1. Left ventricular ejection fraction, by estimation, is 60 to 65%. The left ventricle has normal function. The left ventricle has no regional wall motion abnormalities. Left ventricular diastolic parameters were normal.   2. Right ventricular systolic function is normal. The right ventricular size is normal. There is normal pulmonary artery systolic pressure. The estimated right ventricular systolic pressure is 23.1 mmHg.   3. The mitral valve is grossly normal. Trivial mitral valve regurgitation. No evidence of mitral stenosis.   4. The aortic valve is tricuspid. Aortic valve regurgitation is not visualized. No aortic stenosis is present.   5. The inferior vena cava is normal in size with greater than 50% respiratory variability, suggesting right atrial pressure of 3 mmHg.     The patient wore the monitor for 12 days 22 hours hours starting October 16, 2020: Indication: Palpitations   The minimum heart rate was 43 bpm, maximum heart rate was 169 bpm, and average heart rate was 81 bpm. Predominant underlying rhythm was Sinus Rhythm.    54 Supraventricular Tachycardia runs occurred, the run with the fastest interval lasting 8 beats with a maximum  rate of 169 bpm (average 54 bpm); the run with the fastest interval was also the longest.   Premature atrial complexes were occasional (4.0%,57584). Premature Ventricular complexes were rare less than 1%.   No ventricular tachycardia, no pauses, No AV block and no atrial fibrillation present. 97 patient triggered events: One associated with supraventricular tachycardia and the remaining associated with premature  atrial complexes sinus rhythm.   Conclusion: This study is remarkable for the following:     1. Symptomatic occasional premature atrial complex.     2. Supraventricular tachycardia which is likely atrial tachycardia with variable block.    Recent Labs: 05/03/2022: TSH 5.310 07/13/2022: ALT 14; BUN 18; Creatinine 1.12; Hemoglobin 15.0; Platelet Count 279; Potassium 4.2; Sodium 138  Recent Lipid Panel    Component Value Date/Time   CHOL 214 (H) 02/28/2020 0907   TRIG 96 02/28/2020 0907   HDL 58 02/28/2020 0907   CHOLHDL 3.7 02/28/2020 0907   LDLCALC 139 (H) 02/28/2020 0907    History of Present Illness    42 year old female with the above past medical history including SVT, PVCs, hypertension, hyperlipidemia, hypothyroidism, antiphospholipid syndrome, migraines, and GERD.  She was diagnosed with antiphospholipid syndrome following a stillbirth.  She declined anticoagulation therapy.  She follows with hematology/oncology.  She follows with endocrinology for history of hypothyroidism.  She has a history of significant palpitations.  Cardiac monitor in 2022 revealed predominantly sinus rhythm, SVT, PACs and PVCs.  Echocardiogram at that time showed EF 60 to 65%, normal LV function, no RWMA, normal RV systolic function, no significant valvular abnormalities. She was last seen in the office on 05/03/2022 and was stable overall from a cardiac standpoint.  She denies any significant palpitations on flecainide and propranolol.  She contacted our office on 01/27/2023 and noted significantly elevated blood pressure.  She presents today for follow-up accompanied by her wife.  Since her last visit she has been stable overall from a cardiac standpoint though she has noted an increase in blood pressure over the past 48 hours.  With this she has noted increased palpitations, lightheadedness, fatigue, nausea, anxiety, and loss of balance. She denies any syncope.  She has been taking her flecainide and propranolol  as prescribed though she notes she missed a dose of propranolol approximately 2 days ago.  Her BP has been significantly elevated in the 150s-180s/90s-100s. Orthostatics were negative in office today. She works night shift and notes that she often drinks alcohol prior to going to bed in the mornings.  She also smokes occasionally uses a nicotine pouch.  She takes CBD for daily anxiety, she still notes daily symptoms of anxiety.    Home Medications    Current Outpatient Medications  Medication Sig Dispense Refill   acetaminophen (TYLENOL) 500 MG tablet Take 500 mg by mouth every 6 (six) hours as needed.     butalbital-acetaminophen-caffeine (FIORICET) 50-325-40 MG tablet Take 1 tablet by mouth 2 (two) times daily as needed for migraine.     ergocalciferol (VITAMIN D2) 1.25 MG (50000 UT) capsule Take 1 capsule (50,000 Units total) by mouth once a week. 8 capsule 4   flecainide (TAMBOCOR) 100 MG tablet Take 1 tablet (100 mg total) by mouth 2 (two) times daily. 180 tablet 3   levothyroxine (SYNTHROID) 175 MCG tablet Take 175 mcg by mouth daily before breakfast. Monday to Saturday ,  takes half tab on sunday's     Melatonin 2.5 MG CHEW Chew 5 mg by mouth at bedtime.  olmesartan (BENICAR) 20 MG tablet Take 1 tablet (20 mg total) by mouth daily. 90 tablet 3   propranolol (INDERAL) 10 MG tablet Take 1 tablet (10 mg total) by mouth daily. 90 tablet 3   No current facility-administered medications for this visit.     Review of Systems    She denies chest pain, pnd, orthopnea, n, v, syncope, edema, weight gain, or early satiety. All other systems reviewed and are otherwise negative except as noted above.   Physical Exam    VS:  BP (!) 130/92   Pulse 62   Ht 5\' 3"  (1.6 m)   Wt 232 lb 3.2 oz (105.3 kg)   BMI 41.13 kg/m  , GEN: Well nourished, well developed, in no acute distress. HEENT: normal. Neck: Supple, no JVD, carotid bruits, or masses. Cardiac: RRR, no murmurs, rubs, or gallops. No  clubbing, cyanosis, edema.  Radials/DP/PT 2+ and equal bilaterally.  Respiratory:  Respirations regular and unlabored, clear to auscultation bilaterally. GI: Soft, nontender, nondistended, BS + x 4. MS: no deformity or atrophy. Skin: warm and dry, no rash. Neuro:  Strength and sensation are intact. Psych: Normal affect.  Accessory Clinical Findings    ECG personally reviewed by me today -NSR, 62 bpm- no acute changes.   Lab Results  Component Value Date   WBC 11.5 (H) 07/13/2022   HGB 15.0 07/13/2022   HCT 45.2 07/13/2022   MCV 93.2 07/13/2022   PLT 279 07/13/2022   Lab Results  Component Value Date   CREATININE 1.12 (H) 07/13/2022   BUN 18 07/13/2022   NA 138 07/13/2022   K 4.2 07/13/2022   CL 103 07/13/2022   CO2 24 07/13/2022   Lab Results  Component Value Date   ALT 14 07/13/2022   AST 14 (L) 07/13/2022   ALKPHOS 48 07/13/2022   BILITOT 1.0 07/13/2022   Lab Results  Component Value Date   CHOL 214 (H) 02/28/2020   HDL 58 02/28/2020   LDLCALC 139 (H) 02/28/2020   TRIG 96 02/28/2020   CHOLHDL 3.7 02/28/2020    Lab Results  Component Value Date   HGBA1C 5.4 02/26/2019    Assessment & Plan   1. Hypertension: BP elevated in office today. Over the past 48 hours she has had home BP readings in the 150s-180s/90s-100s. She notes associated palpitations, lightheadedness, fatigue, nausea, anxiety, and loss of balance.  Denies syncope.  Orthostatics negative in office today. Will start olmesartan 20 mg daily (confirmed no interaction with flecainide).  Will repeat BMET in 2 weeks.  Discussed ED precautions.  2. Palpitations/SVT/PVCs/PACs: Echo in 2022 showed EF 60 to 65%, normal LV function, no RWMA, normal RV systolic function, no significant valvular abnormalities. Cardiac monitor in 2022 revealed predominantly sinus rhythm, SVT, PACs and PVCs.  She notes a recent increase in palpitations.  She missed 1 dose of her propranolol.  She has also had elevated blood pressure  and associated symptoms as above (see #1).  Will check CBC, CMET, TSH, and flecainide level.  If symptoms persist despite BP control, anxiety management, consider repeat cardiac monitor.  Continue flecainide, propranolol.  3. Hyperlipidemia: LDL was 139 in 02/2020. Statin intolerant.  Consider repeat fasting lipids, LFTs at follow-up.  4. Hypothyroidism: TSH was 3.45 in 05/2022.  Will check TSH given recent symptoms of fatigue, anxiety, palpitations, and elevated BP.  Follows with endocrinology.  5. Antiphospholipid syndrome: Given recent symptoms of fatigue, loss of balance, elevated BP, recommend follow-up with hematology/oncology.  6. Anxiety: This  appears to be a chronic problem.  She takes CBD daily however, continues to experience daily anxiety.  She is establishing with a new PCP next week.  Encouraged her to discuss treatment options for anxiety (consider SSRI) as I suspect this could be contributing to her symptoms of palpitations and elevated BP.  7. Disposition: Follow-up in 4 to 6 weeks.  Joylene Grapes, NP 01/28/2023, 4:47 PM

## 2023-01-28 NOTE — Patient Instructions (Addendum)
Medication Instructions:  Start Olmesartan 20 mg daily   *If you need a refill on your cardiac medications before your next appointment, please call your pharmacy*   Lab Work: Your physician recommends that you complete lab work today and  return for lab work in 2 weeks:   CBC, CMET, TSH, Flecainide level (today) & BMET (2 weeks)  If you have labs (blood work) drawn today and your tests are completely normal, you will receive your results only by: MyChart Message (if you have MyChart) OR A paper copy in the mail If you have any lab test that is abnormal or we need to change your treatment, we will call you to review the results.   Testing/Procedures: NONE ordered at this time of appointment    Follow-Up: At [facility], you and your health needs are our priority.  As part of our continuing mission to provide you with exceptional heart care, we have created designated Provider Care Teams.  These Care Teams include your primary Cardiologist (physician) and Advanced Practice Providers (APPs -  Physician Assistants and Nurse Practitioners) who all work together to provide you with the care you need, when you need it.  We recommend signing up for the patient portal called "MyChart".  Sign up information is provided on this After Visit Summary.  MyChart is used to connect with patients for Virtual Visits (Telemedicine).  Patients are able to view lab/test results, encounter notes, upcoming appointments, etc.  Non-urgent messages can be sent to your provider as well.   To learn more about what you can do with MyChart, go to [email or web address].    Your next appointment:   4-6 week(s)  Provider:   Thomasene Ripple, DO  or Bernadene Person, NP        Other Instructions

## 2023-01-31 DIAGNOSIS — E039 Hypothyroidism, unspecified: Secondary | ICD-10-CM | POA: Diagnosis not present

## 2023-01-31 DIAGNOSIS — E669 Obesity, unspecified: Secondary | ICD-10-CM | POA: Diagnosis not present

## 2023-02-03 ENCOUNTER — Encounter: Payer: Self-pay | Admitting: Family Medicine

## 2023-02-03 ENCOUNTER — Ambulatory Visit: Payer: 59 | Admitting: Family Medicine

## 2023-02-03 VITALS — BP 133/84 | HR 86 | Ht 63.0 in | Wt 233.0 lb

## 2023-02-03 DIAGNOSIS — E039 Hypothyroidism, unspecified: Secondary | ICD-10-CM | POA: Diagnosis not present

## 2023-02-03 DIAGNOSIS — Z1231 Encounter for screening mammogram for malignant neoplasm of breast: Secondary | ICD-10-CM

## 2023-02-03 DIAGNOSIS — F419 Anxiety disorder, unspecified: Secondary | ICD-10-CM | POA: Diagnosis not present

## 2023-02-03 DIAGNOSIS — I10 Essential (primary) hypertension: Secondary | ICD-10-CM | POA: Diagnosis not present

## 2023-02-03 DIAGNOSIS — Z0000 Encounter for general adult medical examination without abnormal findings: Secondary | ICD-10-CM

## 2023-02-03 NOTE — Assessment & Plan Note (Signed)
Referral for counseling. Consider an SSRI like Celexa, etc and let me know what you think.

## 2023-02-03 NOTE — Assessment & Plan Note (Signed)
Blood pressure is at goal for age and co-morbidities.   Recommendations: continue olmesartan - BP goal <130/80 - monitor and log blood pressures at home - check around the same time each day in a relaxed setting - Limit salt to <2000 mg/day - Follow DASH eating plan (heart healthy diet) - limit alcohol to 2 standard drinks per day for men and 1 per day for women - avoid tobacco products - get at least 2 hours of regular aerobic exercise weekly Patient aware of signs/symptoms requiring further/urgent evaluation.

## 2023-02-03 NOTE — Assessment & Plan Note (Signed)
Following with endocrinology - requesting to change to [facility] provider  Continue Synthroid at current dose

## 2023-02-03 NOTE — Patient Instructions (Signed)
Referral for counseling. Consider an SSRI like Celexa, etc and let me know what you think.   [place] [facility] - Urgent care services - Outpatient services: Individual Therapy Partial Hospitalization Program (PHP) Substance Abuse Intensive Outpatient Program [facility]) Specialized Intensive Adult Group Therapy Medication Management Peer Living Room  Phone: [phone]  Address: [address] [place], [place] [address]  Hours: Open 24/7, No appointment required.

## 2023-02-03 NOTE — Progress Notes (Signed)
New Patient Office Visit  Subjective    Patient ID: Cassidy [name], female    DOB: October 23, 1980  Age: 42 y.o. MRN: [id number]  CC:  Chief Complaint  Patient presents with   Establish Care    HPI Cassidy [name] presents to establish care. She has not a PCP in awhile. She has been following with her specilaists - cardiology, endocrinology.   She follows with cardiology for SVT/palpitations. She had been getting very high BP readings and headaches/dizziness the past few weeks, so she followed with cardiology last week and was started on olmesartan for HTN. They also have her on flecainide and propranolol. States she has started feeling better and BP is slowly coming down.   She follows with endocrinology for hypothyroidism. She just saw them earlier this week and was increased to levothyroxine 200 mcg daily with plans to follow-up with labs in 2 months. She is asking for a referral to [facility] endocrinology.   Reports she has some baseline anxiety and PTSD that has been worsening lately and causing some nightmares. She has done counseling in the past, but would like a referral to try again. She is hesitant about medication, stating she doesn't want something to make her feel out of it. No SI/HI.     02/03/2023    1:44 PM 05/29/2019   10:28 AM 02/26/2019    2:40 PM  PHQ9 SCORE ONLY  PHQ-9 Total Score 12 0 2      02/03/2023    1:44 PM 05/29/2019   10:27 AM 02/26/2019    2:40 PM  GAD 7 : Generalized Anxiety Score  Nervous, Anxious, on Edge 2 0 0  Control/stop worrying 2 0 0  Worry too much - different things 2 0 0  Trouble relaxing 2 0 0  Restless 1 0 0  Easily annoyed or irritable 2 0 0  Afraid - awful might happen 1 0 0  Total GAD 7 Score 12 0 0  Anxiety Difficulty Somewhat difficult          Outpatient Encounter Medications as of 02/03/2023  Medication Sig   ergocalciferol (VITAMIN D2) 1.25 MG (50000 UT) capsule Take 1 capsule (50,000 Units total) by mouth once a week.   flecainide  (TAMBOCOR) 100 MG tablet Take 1 tablet (100 mg total) by mouth 2 (two) times daily.   levothyroxine (SYNTHROID) 200 MCG tablet Take 200 mcg by mouth daily before breakfast.   Melatonin 2.5 MG CHEW Chew 5 mg by mouth at bedtime.   olmesartan (BENICAR) 20 MG tablet Take 1 tablet (20 mg total) by mouth daily.   propranolol (INDERAL) 10 MG tablet Take 1 tablet (10 mg total) by mouth daily.   [DISCONTINUED] acetaminophen (TYLENOL) 500 MG tablet Take 500 mg by mouth every 6 (six) hours as needed.   butalbital-acetaminophen-caffeine (FIORICET) 50-325-40 MG tablet Take 1 tablet by mouth 2 (two) times daily as needed for migraine. (Patient not taking: Reported on 02/03/2023)   [DISCONTINUED] levothyroxine (SYNTHROID) 175 MCG tablet Take 175 mcg by mouth daily before breakfast. Monday to Saturday ,  takes half tab on sunday's   No facility-administered encounter medications on file as of 02/03/2023.    Past Medical History:  Diagnosis Date   Acquired hypothyroidism    endocrinologist-- Rudi Heap NP ([facility] [place])   Antiphospholipid antibody syndrome [facility])    hematology/ oncology--- dr Myna Hidalgo;  dx age 89 work-up done due to still birth (umbilical cord w/ clot)   Endometrial polyp  GERD (gastroesophageal reflux disease)    Irregular menses    Migraines    Mixed hyperlipidemia    PAT (paroxysmal atrial tachycardia)    cardiologist--- dr Kirtland Bouchard. tobb;  long hx takes flecainide and propranolol;   event monitor 11-04-2020 symptomatic occasional PAC SVT likely PAT;  echo 10-29-2020 ef 6-65%   Postcoital bleeding    PSVT (paroxysmal supraventricular tachycardia)    Restless leg syndrome    Uterine leiomyoma    Vertigo    Wears glasses     Past Surgical History:  Procedure Laterality Date   COLONOSCOPY WITH ESOPHAGOGASTRODUODENOSCOPY (EGD)  2021   DILATATION & CURETTAGE/HYSTEROSCOPY WITH MYOSURE N/A 01/15/2022   Procedure: DILATATION & CURETTAGE/HYSTEROSCOPY WITH MYOSURE;  Surgeon: Genia Del, MD;  Location: [facility];  Service: Gynecology;  Laterality: N/A;   HYSTEROSCOPY WITH NOVASURE N/A 01/15/2022   Procedure: HYSTEROSCOPY WITH NOVASURE;  Surgeon: Genia Del, MD;  Location: [facility];  Service: Gynecology;  Laterality: N/A;   TONSILLECTOMY     child    Family History  Problem Relation Age of Onset   Hyperlipidemia Mother    Supraventricular tachycardia Mother    Atrial fibrillation Mother    Hyperlipidemia Father    Healthy Brother    Breast cancer Maternal Aunt    Breast cancer Maternal Grandmother     Social History   Socioeconomic History   Marital status: Married    Spouse name: Not on file   Number of children: Not on file   Years of education: Not on file   Highest education level: Not on file  Occupational History   Not on file  Tobacco Use   Smoking status: Every Day    Packs/day: 2.00    Years: 28.00    Additional pack years: 0.00    Total pack years: 56.00    Types: Cigarettes, E-cigarettes   Smokeless tobacco: Current   Tobacco comments:    01-13-2022  per pt smokes 5 cig per day and uses nicotine pouch's patient also vapes  Vaping Use   Vaping Use: Former   Devices: quit 2022  Substance and Sexual Activity   Alcohol use: Yes    Alcohol/week: 7.0 - 14.0 standard drinks of alcohol    Types: 7 - 14 Standard drinks or equivalent per week   Drug use: Not Currently    Comment: Did use CBD, but stopped   Sexual activity: Yes    Partners: Female    Birth control/protection: None    Comment: not in past 2 weeks  Other Topics Concern   Not on file  Social History Narrative   Not on file   Social Determinants of Health   Financial Resource Strain: Not on file  Food Insecurity: No Food Insecurity (07/29/2022)   Hunger Vital Sign    Worried About Running Out of Food in the Last Year: Never true    Ran Out of Food in the Last Year: Never true  Transportation Needs: No Transportation Needs  (07/29/2022)   PRAPARE - Administrator, Civil Service (Medical): No    Lack of Transportation (Non-Medical): No  Physical Activity: Not on file  Stress: Not on file  Social Connections: Not on file  Intimate Partner Violence: Not on file    ROS All review of systems negative except what is listed in the HPI      Objective    BP 133/84   Pulse 86   Ht 5\' 3"  (1.6  m)   Wt 233 lb (105.7 kg)   SpO2 100%   BMI 41.27 kg/m   Physical Exam Vitals reviewed.  Constitutional:      General: She is not in acute distress.    Appearance: Normal appearance. She is obese. She is not ill-appearing.  Cardiovascular:     Rate and Rhythm: Normal rate and regular rhythm.     Pulses: Normal pulses.     Heart sounds: Normal heart sounds.  Pulmonary:     Effort: Pulmonary effort is normal.     Breath sounds: Normal breath sounds.  Skin:    General: Skin is warm and dry.  Neurological:     Mental Status: She is alert and oriented to person, place, and time.  Psychiatric:        Mood and Affect: Mood normal.        Behavior: Behavior normal.        Thought Content: Thought content normal.        Judgment: Judgment normal.         Assessment & Plan:   Problem List Items Addressed This Visit     Hypothyroidism    Following with endocrinology - requesting to change to [facility] provider  Continue Synthroid at current dose         Relevant Medications   levothyroxine (SYNTHROID) 200 MCG tablet   Other Relevant Orders   Ambulatory referral to Endocrinology   Anxiety    Referral for counseling. Consider an SSRI like Celexa, etc and let me know what you think.       Relevant Orders   Ambulatory referral to Behavioral Health   Primary hypertension    Blood pressure is at goal for age and co-morbidities.   Recommendations: continue olmesartan - BP goal <130/80 - monitor and log blood pressures at home - check around the same time each day in a relaxed setting - Limit  salt to <2000 mg/day - Follow DASH eating plan (heart healthy diet) - limit alcohol to 2 standard drinks per day for men and 1 per day for women - avoid tobacco products - get at least 2 hours of regular aerobic exercise weekly Patient aware of signs/symptoms requiring further/urgent evaluation.        Other Visit Diagnoses     Encounter for medical examination to establish care    -  Primary   Relevant Orders   MM 3D DIAGNOSTIC MAMMOGRAM BILATERAL BREAST   US BREAST COMPLETE UNI RIGHT INC AXILLA   Encounter for screening mammogram for malignant neoplasm of breast       Relevant Orders   MM 3D DIAGNOSTIC MAMMOGRAM BILATERAL BREAST   US BREAST COMPLETE UNI RIGHT INC AXILLA       Return in about 6 months (around 08/06/2023).   Clayborne Dana, NP

## 2023-02-08 ENCOUNTER — Telehealth: Payer: Self-pay | Admitting: Cardiology

## 2023-02-08 NOTE — Telephone Encounter (Signed)
Patient is aware of lab results and provider recommendations. She stated she has seen Endocrinologist on Monday because she seen her results in [place] and noticed her levels were elevated.   She is also asking about Flecainide levels.

## 2023-02-08 NOTE — Telephone Encounter (Signed)
Patient is returning call in regards to lab results. Requesting call back. Requesting call back before 3 p.m.

## 2023-02-09 ENCOUNTER — Telehealth: Payer: Self-pay | Admitting: Cardiology

## 2023-02-09 ENCOUNTER — Ambulatory Visit: Payer: Self-pay

## 2023-02-09 NOTE — Telephone Encounter (Signed)
Left voicemail for patient to return call to office. 

## 2023-02-09 NOTE — Patient Outreach (Signed)
  Care Coordination   02/09/2023 Name: Cassidy [name] MRN: [id number] DOB: July 29, 1981   Care Coordination Outreach Attempts:  An unsuccessful telephone outreach was attempted for a scheduled appointment today.  Follow Up Plan:  Additional outreach attempts will be made to offer the patient care coordination information and services.   Encounter Outcome:  No Answer   Care Coordination Interventions:  No, not indicated    Delsa Sale, RN, BSN, CCM Care Management Coordinator [facility] Care Management  Direct Phone: [phone]

## 2023-02-09 NOTE — Telephone Encounter (Signed)
Patient is returning LPN's call. She states she is still at work at this time, so she is requesting a callback between 10:30 am - 1:00 pm.

## 2023-02-09 NOTE — Telephone Encounter (Signed)
Patient is aware that flecainide takes a while to result and once we have results we will give her a call.

## 2023-02-12 LAB — COMPREHENSIVE METABOLIC PANEL
ALT: 19 IU/L (ref 0–32)
AST: 18 IU/L (ref 0–40)
Albumin/Globulin Ratio: 1.5 (ref 1.2–2.2)
Albumin: 4.1 g/dL (ref 3.9–4.9)
Alkaline Phosphatase: 58 IU/L (ref 44–121)
BUN/Creatinine Ratio: 13 (ref 9–23)
BUN: 11 mg/dL (ref 6–24)
Bilirubin Total: 0.6 mg/dL (ref 0.0–1.2)
CO2: 21 mmol/L (ref 20–29)
Calcium: 9 mg/dL (ref 8.7–10.2)
Chloride: 103 mmol/L (ref 96–106)
Creatinine, Ser: 0.84 mg/dL (ref 0.57–1.00)
Globulin, Total: 2.8 g/dL (ref 1.5–4.5)
Glucose: 87 mg/dL (ref 70–99)
Potassium: 4.1 mmol/L (ref 3.5–5.2)
Sodium: 139 mmol/L (ref 134–144)
Total Protein: 6.9 g/dL (ref 6.0–8.5)
eGFR: 88 mL/min/{1.73_m2} (ref >59)

## 2023-02-12 LAB — CBC
Hematocrit: 44.4 % (ref 34.0–46.6)
Hemoglobin: 14.9 g/dL (ref 11.1–15.9)
MCH: 32.5 pg (ref 26.6–33.0)
MCHC: 33.6 g/dL (ref 31.5–35.7)
MCV: 97 fL (ref 79–97)
Platelets: 242 10*3/uL (ref 150–450)
RBC: 4.59 x10E6/uL (ref 3.77–5.28)
RDW: 12.8 % (ref 11.7–15.4)
WBC: 10.5 10*3/uL (ref 3.4–10.8)

## 2023-02-12 LAB — TSH: TSH: 7.67 u[IU]/mL — ABNORMAL HIGH (ref 0.450–4.500)

## 2023-02-12 LAB — FLECAINIDE LEVEL: Flecainide: 0.39 ug/ml (ref 0.20–1.00)

## 2023-02-14 ENCOUNTER — Other Ambulatory Visit: Payer: Self-pay | Admitting: Family Medicine

## 2023-02-14 DIAGNOSIS — N6310 Unspecified lump in the right breast, unspecified quadrant: Secondary | ICD-10-CM

## 2023-02-15 ENCOUNTER — Telehealth: Payer: Self-pay | Admitting: *Deleted

## 2023-02-15 NOTE — Progress Notes (Signed)
  Care Coordination Note  02/15/2023 Name: LAKYLA GUPPY MRN: [id number] DOB: [birth year]  Cassidy [name] is a 42 y.o. year old female who is a primary care patient of Clayborne Dana, NP and is actively engaged with the care management team. I reached out to Cassidy [name] by phone today to assist with re-scheduling a follow up visit with the RN Case Manager  Follow up plan: Unsuccessful telephone outreach attempt made.   [facility]  Care Coordination Care Guide  Direct Dial: [phone]

## 2023-02-16 ENCOUNTER — Telehealth: Payer: Self-pay

## 2023-02-16 LAB — BASIC METABOLIC PANEL
BUN/Creatinine Ratio: 14 (ref 9–23)
BUN: 12 mg/dL (ref 6–24)
CO2: 21 mmol/L (ref 20–29)
Calcium: 9.2 mg/dL (ref 8.7–10.2)
Chloride: 101 mmol/L (ref 96–106)
Creatinine, Ser: 0.88 mg/dL (ref 0.57–1.00)
Glucose: 90 mg/dL (ref 70–99)
Potassium: 4.6 mmol/L (ref 3.5–5.2)
Sodium: 136 mmol/L (ref 134–144)
eGFR: 84 mL/min/{1.73_m2} (ref >59)

## 2023-02-16 NOTE — Telephone Encounter (Signed)
Routing to Bernadene Person NP [facility])

## 2023-02-16 NOTE — Telephone Encounter (Signed)
Pt returned call. Spoke with pt. Pt was notified of lab results. Pt had an appointment with her endocrinologist. Her levothyroxine lose was increase to 200 mg daily. Pt was notified that the Flecainide level was normal. Pt did want to let you know she is feeling a lot better on the BP meds.

## 2023-02-16 NOTE — Telephone Encounter (Signed)
Patient is returning call. Transferred to to [place], [place].

## 2023-02-16 NOTE — Telephone Encounter (Signed)
Lmom to discuss lab results. Waiting on a return call.  

## 2023-02-23 ENCOUNTER — Encounter: Payer: Self-pay | Admitting: Nurse Practitioner

## 2023-02-23 ENCOUNTER — Ambulatory Visit: Payer: 59 | Attending: Nurse Practitioner | Admitting: Nurse Practitioner

## 2023-02-23 VITALS — BP 112/84 | HR 80 | Ht 63.0 in | Wt 229.2 lb

## 2023-02-23 DIAGNOSIS — I10 Essential (primary) hypertension: Secondary | ICD-10-CM | POA: Diagnosis not present

## 2023-02-23 DIAGNOSIS — E039 Hypothyroidism, unspecified: Secondary | ICD-10-CM

## 2023-02-23 DIAGNOSIS — E782 Mixed hyperlipidemia: Secondary | ICD-10-CM | POA: Diagnosis not present

## 2023-02-23 DIAGNOSIS — I471 Supraventricular tachycardia, unspecified: Secondary | ICD-10-CM

## 2023-02-23 DIAGNOSIS — D6861 Antiphospholipid syndrome: Secondary | ICD-10-CM | POA: Diagnosis not present

## 2023-02-23 DIAGNOSIS — F419 Anxiety disorder, unspecified: Secondary | ICD-10-CM

## 2023-02-23 DIAGNOSIS — R002 Palpitations: Secondary | ICD-10-CM | POA: Diagnosis not present

## 2023-02-23 NOTE — Patient Instructions (Signed)
Medication Instructions:  Your physician recommends that you continue on your current medications as directed. Please refer to the Current Medication list given to you today.  *If you need a refill on your cardiac medications before your next appointment, please call your pharmacy*   Lab Work: NONE ordered at this time of appointment   Testing/Procedures: NONE ordered at this time of appointment     Follow-Up: At [facility], you and your health needs are our priority.  As part of our continuing mission to provide you with exceptional heart care, we have created designated Provider Care Teams.  These Care Teams include your primary Cardiologist (physician) and Advanced Practice Providers (APPs -  Physician Assistants and Nurse Practitioners) who all work together to provide you with the care you need, when you need it.  We recommend signing up for the patient portal called "MyChart".  Sign up information is provided on this After Visit Summary.  MyChart is used to connect with patients for Virtual Visits (Telemedicine).  Patients are able to view lab/test results, encounter notes, upcoming appointments, etc.  Non-urgent messages can be sent to your provider as well.   To learn more about what you can do with MyChart, go to [email or web address].    Your next appointment:    Keep follow up   Provider:   Thomasene Ripple, DO     Other Instructions Discuss Trial of Buspirone with Primary Care Physician

## 2023-02-23 NOTE — Progress Notes (Signed)
  Care Coordination Note  02/23/2023 Name: Cassidy [name] MRN: [id number] DOB: 1981/02/05  Cassidy [name] is a 42 y.o. year old female who is a primary care patient of Clayborne Dana, NP and is actively engaged with the care management team. I reached out to Cassidy [name] by phone today to assist with re-scheduling a follow up visit with the RN Case Manager  Follow up plan: Telephone appointment with care management team member scheduled for:7/22  [facility] Coordination Care Guide  Direct Dial: [phone]

## 2023-02-23 NOTE — Progress Notes (Signed)
Office Visit    Patient Name: Cassidy [name] Date of Encounter: 02/23/2023  Primary Care Provider:  Clayborne Dana, NP Primary Cardiologist:  Cassidy Ripple, DO  Chief Complaint    42 year old female with a history of SVT, PVCs, hypertension, hyperlipidemia, hypothyroidism, antiphospholipid syndrome, migraines, and GERD who presents for follow-up related to hypertension.   Past Medical History    Past Medical History:  Diagnosis Date   Acquired hypothyroidism    endocrinologist-- Cassidy Heap NP ([facility] [place])   Antiphospholipid antibody syndrome [facility])    hematology/ oncology--- dr Cassidy [name];  dx age 74 work-up done due to still birth (umbilical cord w/ clot)   Endometrial polyp    GERD (gastroesophageal reflux disease)    Irregular menses    Migraines    Mixed hyperlipidemia    PAT (paroxysmal atrial tachycardia)    cardiologist--- dr Cassidy [name]. [name];  long hx takes flecainide and propranolol;   event monitor 11-04-2020 symptomatic occasional PAC SVT likely PAT;  echo 10-29-2020 ef 6-65%   Postcoital bleeding    PSVT (paroxysmal supraventricular tachycardia)    Restless leg syndrome    Uterine leiomyoma    Vertigo    Wears glasses    Past Surgical History:  Procedure Laterality Date   COLONOSCOPY WITH ESOPHAGOGASTRODUODENOSCOPY (EGD)  2021   DILATATION & CURETTAGE/HYSTEROSCOPY WITH MYOSURE N/A 01/15/2022   Procedure: DILATATION & CURETTAGE/HYSTEROSCOPY WITH MYOSURE;  Surgeon: Cassidy Del, MD;  Location: [facility];  Service: Gynecology;  Laterality: N/A;   HYSTEROSCOPY WITH NOVASURE N/A 01/15/2022   Procedure: HYSTEROSCOPY WITH NOVASURE;  Surgeon: Cassidy Del, MD;  Location: [facility];  Service: Gynecology;  Laterality: N/A;   TONSILLECTOMY     child    Allergies  Allergies  Allergen Reactions   Statins     Muscle aches   Benadryl [Diphenhydramine] Other (See Comments)    "restless leg, so I need gabapentin with it"    Ciprofloxacin     Flu-like symptoms with muscle aches   Nsaids Hives    PER PT THIS INCLUDES ALL TYPES OF NSAIDS THAT CAUSE HIVES   Other     Per pt paprika causes tachycardia   Tolmetin Hives   Aspirin Hives     Labs/Other Studies Reviewed    The following studies were reviewed today:  Cardiac Studies & Procedures       ECHOCARDIOGRAM  ECHOCARDIOGRAM COMPLETE 10/29/2020  Narrative ECHOCARDIOGRAM REPORT    Patient Name:   Cassidy [name] Date of Exam: 10/29/2020 Medical Rec #:  [id number]  Height:       63.0 in Accession #:    [id number] Weight:       200.0 lb Date of Birth:  02-May-1981 BSA:          1.934 m Patient Age:    39 years   BP:           147/86 mmHg Patient Gender: F          HR:           87 bpm. Exam Location:  Outpatient  Procedure: 2D Echo, Color Doppler and Cardiac Doppler  Indications:    R01.1 Murmur; R00.2 Palpitations  History:        Patient has no prior history of Echocardiogram examinations. Arrythmias:Atrial Fibrillation; Risk Factors:Dyslipidemia.  Sonographer:    Cassidy [name] Senior RDCS Referring Phys: [id number] Cassidy [name]  IMPRESSIONS   1. Left ventricular ejection fraction, by estimation, is 60 to 65%. The left  ventricle has normal function. The left ventricle has no regional wall motion abnormalities. Left ventricular diastolic parameters were normal. 2. Right ventricular systolic function is normal. The right ventricular size is normal. There is normal pulmonary artery systolic pressure. The estimated right ventricular systolic pressure is 23.1 mmHg. 3. The mitral valve is grossly normal. Trivial mitral valve regurgitation. No evidence of mitral stenosis. 4. The aortic valve is tricuspid. Aortic valve regurgitation is not visualized. No aortic stenosis is present. 5. The inferior vena cava is normal in size with greater than 50% respiratory variability, suggesting right atrial pressure of 3 mmHg.  Conclusion(s)/Recommendation(s): Normal  biventricular function without evidence of hemodynamically significant valvular heart disease.  FINDINGS Left Ventricle: Left ventricular ejection fraction, by estimation, is 60 to 65%. The left ventricle has normal function. The left ventricle has no regional wall motion abnormalities. The left ventricular internal cavity size was normal in size. There is no left ventricular hypertrophy. Left ventricular diastolic parameters were normal.  Right Ventricle: The right ventricular size is normal. No increase in right ventricular wall thickness. Right ventricular systolic function is normal. There is normal pulmonary artery systolic pressure. The tricuspid regurgitant velocity is 2.24 m/s, and with an assumed right atrial pressure of 3 mmHg, the estimated right ventricular systolic pressure is 23.1 mmHg.  Left Atrium: Left atrial size was normal in size.  Right Atrium: Right atrial size was normal in size.  Pericardium: Trivial pericardial effusion is present. Presence of pericardial fat pad.  Mitral Valve: The mitral valve is grossly normal. Trivial mitral valve regurgitation. No evidence of mitral valve stenosis.  Tricuspid Valve: The tricuspid valve is grossly normal. Tricuspid valve regurgitation is trivial. No evidence of tricuspid stenosis.  Aortic Valve: The aortic valve is tricuspid. Aortic valve regurgitation is not visualized. No aortic stenosis is present.  Pulmonic Valve: The pulmonic valve was grossly normal. Pulmonic valve regurgitation is not visualized. No evidence of pulmonic stenosis.  Aorta: The aortic root and ascending aorta are structurally normal, with no evidence of dilitation.  Venous: The right upper pulmonary vein is normal. The inferior vena cava is normal in size with greater than 50% respiratory variability, suggesting right atrial pressure of 3 mmHg.  IAS/Shunts: The atrial septum is grossly normal.   LEFT VENTRICLE PLAX 2D LVIDd:         4.20 cm   Diastology LVIDs:         3.00 cm  LV e' medial:    9.46 cm/s LV PW:         0.70 cm  LV E/e' medial:  9.5 LV IVS:        1.00 cm  LV e' lateral:   9.90 cm/s LVOT diam:     1.80 cm  LV E/e' lateral: 9.0 LV SV:         46 LV SV Index:   24 LVOT Area:     2.54 cm   RIGHT VENTRICLE RV S prime:     11.30 cm/s TAPSE (M-mode): 2.6 cm  LEFT ATRIUM             Index       RIGHT ATRIUM           Index LA diam:        3.10 cm 1.60 cm/m  RA Area:     20.40 cm LA Vol (A2C):   55.1 ml 28.50 ml/m RA Volume:   59.00 ml  30.51 ml/m LA Vol (A4C):   33.1 ml  17.12 ml/m LA Biplane Vol: 43.7 ml 22.60 ml/m AORTIC VALVE LVOT Vmax:   89.40 cm/s LVOT Vmean:  66.200 cm/s LVOT VTI:    0.182 m  AORTA Ao Root diam: 2.70 cm Ao Asc diam:  3.50 cm  MITRAL VALVE               TRICUSPID VALVE MV Area (PHT): 4.29 cm    TR Peak grad:   20.1 mmHg MV Decel Time: 177 msec    TR Vmax:        224.00 cm/s MV E velocity: 89.50 cm/s MV A velocity: 77.10 cm/s  SHUNTS MV E/A ratio:  1.16        Systemic VTI:  0.18 m Systemic Diam: 1.80 cm  Lennie Odor MD Electronically signed by Lennie Odor MD Signature Date/Time: 10/29/2020/5:40:46 PM    Final    MONITORS  LONG TERM MONITOR (3-14 DAYS) 11/04/2020  Narrative The patient wore the monitor for 12 days 22 hours hours starting October 16, 2020. Indication: Palpitations  The minimum heart rate was 43 bpm, maximum heart rate was 169 bpm, and average heart rate was 81 bpm. Predominant underlying rhythm was Sinus Rhythm.  54 Supraventricular Tachycardia runs occurred, the run with the fastest interval lasting 8 beats with a maximum rate of 169 bpm (average 54 bpm); the run with the fastest interval was also the longest.  Premature atrial complexes were occasional (4.0%,57584). Premature Ventricular complexes were rare less than 1%.  No ventricular tachycardia, no pauses, No AV block and no atrial fibrillation present. 97 patient triggered events: One  associated with supraventricular tachycardia and the remaining associated with premature atrial complexes sinus rhythm.  Conclusion: This study is remarkable for the following: 1. Symptomatic occasional premature atrial complex. 2. Supraventricular tachycardia which is likely atrial tachycardia with variable block.          Recent Labs: 01/28/2023: ALT 19; Hemoglobin 14.9; Platelets 242; TSH 7.670 02/15/2023: BUN 12; Creatinine, Ser 0.88; Potassium 4.6; Sodium 136  Recent Lipid Panel    Component Value Date/Time   CHOL 214 (H) 02/28/2020 0907   TRIG 96 02/28/2020 0907   HDL 58 02/28/2020 0907   CHOLHDL 3.7 02/28/2020 0907   LDLCALC 139 (H) 02/28/2020 0907    History of Present Illness    42 year old female with the above past medical history including SVT, PVCs, hypertension, hyperlipidemia, hypothyroidism, antiphospholipid syndrome, migraines, and GERD.   She was diagnosed with antiphospholipid syndrome following a stillbirth.  She declined anticoagulation therapy.  She follows with hematology/oncology.  She follows with endocrinology for history of hypothyroidism.  She has a history of significant palpitations.  Cardiac monitor in 2022 revealed predominantly sinus rhythm, SVT, PACs and PVCs.  Echocardiogram at that time showed EF 60 to 65%, normal LV function, no RWMA, normal RV systolic function, no significant valvular abnormalities.  She contacted our office on 01/27/2023 and noted significantly elevated blood pressure.  She was last seen in the office on 01/28/2023 and was stable overall from a cardiac standpoint.  She did note elevated blood pressure with associated palpitations, lightheadedness, fatigue, nausea, anxiety.  This was following a missed dose of propranolol.  Orthostatics were negative.  She was started on olmesartan 20 mg daily.  Labs including CBC, CMET, and flecainide level were unremarkable.  TSH was elevated.  She was advised to follow-up with endocrinology.   She presents  today for follow-up accompanied by her wife.  Since her last visit she has been stable from a  cardiac standpoint.  Her BP has been well-controlled and with this change, she has had no further palpitations, lightheadedness, her energy has improved.  She continues to note daily anxiety.  She discussed with her PCP, however, it was noted that flecainide would interact with SSRIs. She was advised to discuss alternative medication options with cardiology. Overall, her symptoms have improved greatly.  Home Medications    Current Outpatient Medications  Medication Sig Dispense Refill   flecainide (TAMBOCOR) 100 MG tablet Take 1 tablet (100 mg total) by mouth 2 (two) times daily. 180 tablet 3   levothyroxine (SYNTHROID) 200 MCG tablet Take 200 mcg by mouth daily before breakfast.     Melatonin 2.5 MG CHEW Chew 5 mg by mouth at bedtime.     olmesartan (BENICAR) 20 MG tablet Take 1 tablet (20 mg total) by mouth daily. 90 tablet 3   propranolol (INDERAL) 10 MG tablet Take 1 tablet (10 mg total) by mouth daily. 90 tablet 3   butalbital-acetaminophen-caffeine (FIORICET) 50-325-40 MG tablet Take 1 tablet by mouth 2 (two) times daily as needed for migraine. (Patient not taking: Reported on 02/23/2023)     ergocalciferol (VITAMIN D2) 1.25 MG (50000 UT) capsule Take 1 capsule (50,000 Units total) by mouth once a week. (Patient not taking: Reported on 02/23/2023) 8 capsule 4   No current facility-administered medications for this visit.     Review of Systems    She denies chest pain, palpitations, dyspnea, pnd, orthopnea, n, v, dizziness, syncope, edema, weight gain, or early satiety. All other systems reviewed and are otherwise negative except as noted above.   Physical Exam    VS:  BP 112/84 (BP Location: Left Arm, Patient Position: Sitting, Cuff Size: Large)   Pulse 80   Ht 5\' 3"  (1.6 m)   Wt 229 lb 3.2 oz (104 kg)   SpO2 97%   BMI 40.60 kg/m   GEN: Well nourished, well developed, in no acute  distress. HEENT: normal. Neck: Supple, no JVD, carotid bruits, or masses. Cardiac: RRR, no murmurs, rubs, or gallops. No clubbing, cyanosis, edema.  Radials/DP/PT 2+ and equal bilaterally.  Respiratory:  Respirations regular and unlabored, clear to auscultation bilaterally. GI: Soft, nontender, nondistended, BS + x 4. MS: no deformity or atrophy. Skin: warm and dry, no rash. Neuro:  Strength and sensation are intact. Psych: Normal affect.  Accessory Clinical Findings    ECG personally reviewed by me today -NSR, 80 bpm- no acute changes.   Lab Results  Component Value Date   WBC 10.5 01/28/2023   HGB 14.9 01/28/2023   HCT 44.4 01/28/2023   MCV 97 01/28/2023   PLT 242 01/28/2023   Lab Results  Component Value Date   CREATININE 0.88 02/15/2023   BUN 12 02/15/2023   NA 136 02/15/2023   K 4.6 02/15/2023   CL 101 02/15/2023   CO2 21 02/15/2023   Lab Results  Component Value Date   ALT 19 01/28/2023   AST 18 01/28/2023   ALKPHOS 58 01/28/2023   BILITOT 0.6 01/28/2023   Lab Results  Component Value Date   CHOL 214 (H) 02/28/2020   HDL 58 02/28/2020   LDLCALC 139 (H) 02/28/2020   TRIG 96 02/28/2020   CHOLHDL 3.7 02/28/2020    Lab Results  Component Value Date   HGBA1C 5.4 02/26/2019    Assessment & Plan    1. Hypertension: BP well controlled.  Significantly improved with olmesartan.  Continue current antihypertensive regimen.   2. Palpitations/SVT/PVCs/PACs: Echo  in 2022 showed EF 60 to 65%, normal LV function, no RWMA, normal RV systolic function, no significant valvular abnormalities. Cardiac monitor in 2022 revealed predominantly sinus rhythm, SVT, PACs and PVCs.  She noted a recent increase in palpitations with elevated BP.  TSH was also elevated at the time.  Recent CBC, CMET, and flecainide level were wnl.  Synthroid was increased.  BP has been well-controlled.  She denies any recent palpitations.  Continue flecainide, propranolol.   3. Hyperlipidemia: LDL was  139 in 02/2020. Statin intolerant. Consider repeat fasting lipids, LFTs at follow-up.   4. Hypothyroidism: TSH was 7.67 in 01/2023.  Synthroid was recently increased.  Follows with endocrinology.   5. Antiphospholipid syndrome: Follows with hematology/oncology.   6. Anxiety: This appears to be a chronic problem.  She takes CBD daily, however, she continues to experience daily anxiety. Unfortunately, flecainide contraindicated with SSRIs.  Discussed with Pharm.D., could consider trial of buspirone.   7. Disposition: Follow-up as scheduled with Dr. Servando Salina in 04/2023.       Joylene Grapes, NP 02/23/2023, 10:47 AM

## 2023-03-17 ENCOUNTER — Other Ambulatory Visit: Payer: 59

## 2023-03-25 ENCOUNTER — Other Ambulatory Visit: Payer: 59

## 2023-04-18 ENCOUNTER — Ambulatory Visit: Payer: Self-pay

## 2023-04-18 NOTE — Patient Outreach (Signed)
  Care Coordination   Follow Up Visit Note   04/18/2023 Name: Cassidy [name] MRN: [id number] DOB: June 10, 1981  Cassidy [name] is a 42 y.o. year old female who sees Clayborne Dana, NP for primary care. I spoke with  Cassidy [name] by phone today.  What matters to the patients health and wellness today?  Patient would like to establish with an Endocrinologist who specializes in Hashimoto Thyroiditis.     Goals Addressed               This Visit's Progress     Patient Stated     I want to improve my Vitamin D level (pt-stated)        Care Coordination Interventions: Evaluation of current treatment plan related to Vitamin D deficiency and patient's adherence to plan as established by provider Review of patient status, including review of consultant's reports, relevant laboratory and other test results, and medications completed. Determined patient has not been consistent with taking her Vitamin D supplement due to having a weekly dosage Counseled patient on importance of medication adherence and educated patient on the benefits while providing rationale for improving Vitamin D level  Determined patient will contact her new PCP to request a Vitamin D recheck in order to determine recommendations for maintenance dosing  Component Ref Range & Units 8 mo ago (07/20/23)        Vitamin D, 25-hydroxy 30-100 mg/dL 5.7             Other     To establish with Endocrinology who specializes in Hashimoto's Thyroiditis        Care Coordination Interventions: Evaluation of current treatment plan related to Hashimoto thyroiditis and patient's adherence to plan as established by provider Reviewed and discussed with patient recent referral to Endocrinology for treatment of Hashimoto's Thyroiditis Determined patient will request a referral to an additional provider due to preferring someone who specializes specifically with Hashimoto's Provided patient with the name/contact number for Vickii Chafe NP  and encouraged patient to discuss new referral with her PCP once she confirms this provider is accepting new patients        COMPLETED: To get established with a new primary care provider        Care Coordination Interventions: Patient interviewed about adult health maintenance status including  reestablishment with a PCP  Determined patient is newly established with Hyman Hopes NP for primary care     Interventions Today    Flowsheet Row Most Recent Value  Chronic Disease   Chronic disease during today's visit Other, Hypertension (HTN)  [Hypothyroidism,  Vitamin D deficiency]  General Interventions   General Interventions Discussed/Reviewed General Interventions Discussed, General Interventions Reviewed, Doctor Visits, Labs  Doctor Visits Discussed/Reviewed Doctor Visits Discussed, Doctor Visits Reviewed, Specialist, PCP  Education Interventions   Education Provided Provided Education  Provided Verbal Education On When to see the doctor, Medication, Labs  Pharmacy Interventions   Pharmacy Dicussed/Reviewed Pharmacy Topics Discussed, Pharmacy Topics Reviewed, Medications and their functions          SDOH assessments and interventions completed:  No     Care Coordination Interventions:  Yes, provided   Follow up plan: Follow up call scheduled for 06/20/23 @09 :30 AM    Encounter Outcome:  Pt. Visit Completed

## 2023-04-18 NOTE — Patient Instructions (Signed)
Visit Information  Thank you for taking time to visit with me today. Please don't hesitate to contact me if I can be of assistance to you.   Following are the goals we discussed today:   Goals Addressed               This Visit's Progress     Patient Stated     I want to improve my Vitamin D level (pt-stated)        Care Coordination Interventions: Evaluation of current treatment plan related to Vitamin D deficiency and patient's adherence to plan as established by provider Review of patient status, including review of consultant's reports, relevant laboratory and other test results, and medications completed. Determined patient has not been consistent with taking her Vitamin D supplement due to having a weekly dosage Counseled patient on importance of medication adherence and educated patient on the benefits while providing rationale for improving Vitamin D level  Determined patient will contact her new PCP to request a Vitamin D recheck in order to determine recommendations for maintenance dosing  Component Ref Range & Units 8 mo ago (07/20/23)        Vitamin D, 25-hydroxy 30-100 mg/dL 5.7             Other     To establish with Endocrinology who specializes in Hashimoto's Thyroiditis        Care Coordination Interventions: Evaluation of current treatment plan related to Hashimoto thyroiditis and patient's adherence to plan as established by provider Reviewed and discussed with patient recent referral to Endocrinology for treatment of Hashimoto's Thyroiditis Determined patient will request a referral to an additional provider due to preferring someone who specializes specifically with Hashimoto's Provided patient with the name/contact number for Vickii Chafe NP and encouraged patient to discuss new referral with her PCP once she confirms this provider is accepting new patients       COMPLETED: To get established with a new primary care provider        Care Coordination  Interventions: Patient interviewed about adult health maintenance status including  reestablishment with a PCP  Determined patient is newly established with Hyman Hopes NP for primary care         Our next appointment is by telephone on 06/20/23 at 09:30 AM   Please call the care guide team at [phone] if you need to cancel or reschedule your appointment.   If you are experiencing a Mental Health or Behavioral Health Crisis or need someone to talk to, please call 1-800-273-TALK (toll free, 24 hour hotline)  Patient verbalizes understanding of instructions and care plan provided today and agrees to view in MyChart. Active MyChart status and patient understanding of how to access instructions and care plan via MyChart confirmed with patient.     Delsa Sale, RN, BSN, CCM Care Management Coordinator [facility] Care Management  Direct Phone: [phone]

## 2023-04-26 ENCOUNTER — Other Ambulatory Visit: Payer: Self-pay | Admitting: Cardiology

## 2023-04-27 ENCOUNTER — Telehealth: Payer: Self-pay | Admitting: Cardiology

## 2023-04-27 ENCOUNTER — Other Ambulatory Visit: Payer: Self-pay | Admitting: Cardiology

## 2023-04-27 MED ORDER — PROPRANOLOL HCL 10 MG PO TABS: 10.0000 mg | ORAL_TABLET | Freq: Every day | ORAL | 3 refills | Status: DC

## 2023-04-27 NOTE — Telephone Encounter (Signed)
*  STAT* If patient is at the pharmacy, call can be transferred to refill team.   1. Which medications need to be refilled? (please list name of each medication and dose if known) propranolol (INDERAL) 10 MG tablet   2. Which pharmacy/location (including street and city if local pharmacy) is medication to be sent to?  [facility] [id number] - [place], [place] - [address]    3. Do they need a 30 day or 90 day supply? 90   Patient is out of this medication and needs this filled asap.

## 2023-04-27 NOTE — Telephone Encounter (Signed)
Left message stating that a refill was sent to her preferred pharmacy.

## 2023-05-09 ENCOUNTER — Ambulatory Visit: Payer: 59 | Attending: Cardiology | Admitting: Cardiology

## 2023-05-09 ENCOUNTER — Encounter: Payer: Self-pay | Admitting: Cardiology

## 2023-05-09 VITALS — BP 132/88 | HR 71 | Ht 63.0 in | Wt 231.0 lb

## 2023-05-09 DIAGNOSIS — I4719 Other supraventricular tachycardia: Secondary | ICD-10-CM | POA: Diagnosis not present

## 2023-05-09 DIAGNOSIS — Z79899 Other long term (current) drug therapy: Secondary | ICD-10-CM

## 2023-05-09 DIAGNOSIS — I10 Essential (primary) hypertension: Secondary | ICD-10-CM | POA: Diagnosis not present

## 2023-05-09 NOTE — Patient Instructions (Signed)
Medication Instructions:  Your physician recommends that you continue on your current medications as directed. Please refer to the Current Medication list given to you today.  *If you need a refill on your cardiac medications before your next appointment, please call your pharmacy*   Lab Work: Your physician recommends that you return for lab work in: CMET, Mag, Flecainide  If you have labs (blood work) drawn today and your tests are completely normal, you will receive your results only by: MyChart Message (if you have MyChart) OR A paper copy in the mail If you have any lab test that is abnormal or we need to change your treatment, we will call you to review the results.   Testing/Procedures: None   Follow-Up: At [facility], you and your health needs are our priority.  As part of our continuing mission to provide you with exceptional heart care, we have created designated Provider Care Teams.  These Care Teams include your primary Cardiologist (physician) and Advanced Practice Providers (APPs -  Physician Assistants and Nurse Practitioners) who all work together to provide you with the care you need, when you need it.  Your next appointment:   9 month(s)  Provider:   Thomasene Ripple, DO

## 2023-05-09 NOTE — Progress Notes (Signed)
Cardiology Office Note:    Date:  05/09/2023   ID:  Cassidy [name], DOB May 26, 1981, MRN [id number]  PCP:  Cassidy Dana, NP  Cardiologist:  Cassidy Ripple, DO  Electrophysiologist:  None   Referring MD: No ref. provider found   " I am ok"  History of Present Illness:    Cassidy [name] is a 42 y.o. female with a hx of  antiphospholipid syndrome not on anticoagulation plans to see hematology today, PVCs, history of SVT and hyperlipidemia is here today for follow-up visit.   Of note the patient was diagnosed with antiphospholipid syndrome after she had a stillbirth and was tested.  She was recommended Coumadin at that time she declined and had not been on any anticoagulation and had not follow-up with hematology. She does have hypothyroidism and she sees endocrine for this.   I did see the patient on October 24, 2020 for the first time when she reported she had been experiencing significant palpitations and fluttering.  Thankfully she had not passed out but the fluttering was significant and she was concerned about this.  At the end of the visit I placed a monitor and recommend an echocardiogram.  She was able to get his testing done in the interim.   I did see the patient in November 13, 2020 at that time we discussed her monitor results which showed paroxysmal atrial tachycardia along with symptomatic PACs.  I started patient on metoprolol and she tells me that her symptoms were worse on metoprolol so stopped this medication.  I started patient on propanolol 20 mg twice a day, she did call reported that she was experiencing some diarrhea and stomach aches and pains on the propanolol recommended during the time that we switch her to Cardizem but she declined reporting that she has significant bad reaction with Cardizem which made her significantly tired.   I saw the patient on November 28, 2020 at that time she was still experiencing palpitations we will place the patient on flecainide 50 mg twice a day  and continue her propanolol 10 mg daily.   I saw the patient on December 18, 2020 at that time she had improvement on her flecainide.  She did have some dizziness I referred her to ENT which she had not had any appointments yet.  At her visit on May 01, 2021 at that time we continue her flecainide and propranolol.  Her last visit with me was 05/03/2022.  Since I saw the patient  she did see Cassidy Person, NP twice : first for accelerated hypertension- she was started on Olmesartan. At her second visit she had responded to the olmesartan. She also reported that she had been taking CBD for anxiety. She also had established care with a primary provider.   She is here today with her significant other. She is excited - recently got a promotion at work.      Past Medical History:  Diagnosis Date   Acquired hypothyroidism    endocrinologist-- Cassidy Heap NP ([facility] [place])   Antiphospholipid antibody syndrome [facility])    hematology/ oncology--- dr Cassidy [name];  dx age 25 work-up done due to still birth (umbilical cord w/ clot)   Endometrial polyp    GERD (gastroesophageal reflux disease)    Irregular menses    Migraines    Mixed hyperlipidemia    PAT (paroxysmal atrial tachycardia)    cardiologist--- dr Cassidy [name]. Cassidy [name];  long hx takes flecainide and propranolol;   event monitor 11-04-2020  symptomatic occasional PAC SVT likely PAT;  echo 10-29-2020 ef 6-65%   Postcoital bleeding    PSVT (paroxysmal supraventricular tachycardia)    Restless leg syndrome    Uterine leiomyoma    Vertigo    Wears glasses     Past Surgical History:  Procedure Laterality Date   COLONOSCOPY WITH ESOPHAGOGASTRODUODENOSCOPY (EGD)  2021   DILATATION & CURETTAGE/HYSTEROSCOPY WITH MYOSURE N/A 01/15/2022   Procedure: DILATATION & CURETTAGE/HYSTEROSCOPY WITH MYOSURE;  Surgeon: Cassidy Del, MD;  Location: [facility];  Service: Gynecology;  Laterality: N/A;   HYSTEROSCOPY WITH NOVASURE N/A 01/15/2022    Procedure: HYSTEROSCOPY WITH NOVASURE;  Surgeon: Cassidy Del, MD;  Location: [facility];  Service: Gynecology;  Laterality: N/A;   TONSILLECTOMY     child    Current Medications: Current Meds  Medication Sig   butalbital-acetaminophen-caffeine (FIORICET) 50-325-40 MG tablet Take 1 tablet by mouth 2 (two) times daily as needed for migraine.   ergocalciferol (VITAMIN D2) 1.25 MG (50000 UT) capsule Take 1 capsule (50,000 Units total) by mouth once a week.   flecainide (TAMBOCOR) 100 MG tablet TAKE 1 TABLET BY MOUTH TWICE A DAY   levothyroxine (SYNTHROID) 200 MCG tablet Take 200 mcg by mouth daily before breakfast.   Melatonin 2.5 MG CHEW Chew 5 mg by mouth at bedtime.   olmesartan (BENICAR) 20 MG tablet Take 1 tablet (20 mg total) by mouth daily.   propranolol (INDERAL) 10 MG tablet Take 1 tablet (10 mg total) by mouth daily.     Allergies:   Statins, Benadryl [diphenhydramine], Ciprofloxacin, Nsaids, Other, Tolmetin, and Aspirin   Social History   Socioeconomic History   Marital status: Married    Spouse name: Not on file   Number of children: Not on file   Years of education: Not on file   Highest education level: Not on file  Occupational History   Not on file  Tobacco Use   Smoking status: Every Day    Current packs/day: 2.00    Average packs/day: 2.0 packs/day for 28.0 years (56.0 ttl pk-yrs)    Types: Cigarettes, E-cigarettes   Smokeless tobacco: Current   Tobacco comments:    01-13-2022  per pt smokes 5 cig per day and uses nicotine pouch's patient also vapes    02/23/2023 Patient smokes about a pack a week and uses a nicotine pouch  Vaping Use   Vaping status: Former   Devices: quit 2022  Substance and Sexual Activity   Alcohol use: Yes    Alcohol/week: 7.0 - 14.0 standard drinks of alcohol    Types: 7 - 14 Standard drinks or equivalent per week   Drug use: Not Currently    Comment: Did use CBD, but stopped   Sexual activity: Yes     Partners: Female    Birth control/protection: None    Comment: not in past 2 weeks  Other Topics Concern   Not on file  Social History Narrative   Not on file   Social Determinants of Health   Financial Resource Strain: Not on file  Food Insecurity: No Food Insecurity (07/29/2022)   Hunger Vital Sign    Worried About Running Out of Food in the Last Year: Never true    Ran Out of Food in the Last Year: Never true  Transportation Needs: No Transportation Needs (07/29/2022)   PRAPARE - Administrator, Civil Service (Medical): No    Lack of Transportation (Non-Medical): No  Physical Activity: Not on  file  Stress: Not on file  Social Connections: Not on file     Family History: The patient's family history includes Atrial fibrillation in her mother; Breast cancer in her maternal aunt and maternal grandmother; Healthy in her brother; Hyperlipidemia in her father and mother; Supraventricular tachycardia in her mother.  ROS:   Review of Systems  Constitution: Negative for decreased appetite, fever and weight gain.  HENT: Negative for congestion, ear discharge, hoarse voice and sore throat.   Eyes: Negative for discharge, redness, vision loss in right eye and visual halos.  Cardiovascular: Negative for chest pain, dyspnea on exertion, leg swelling, orthopnea and palpitations.  Respiratory: Negative for cough, hemoptysis, shortness of breath and snoring.   Endocrine: Negative for heat intolerance and polyphagia.  Hematologic/Lymphatic: Negative for bleeding problem. Does not bruise/bleed easily.  Skin: Negative for flushing, nail changes, rash and suspicious lesions.  Musculoskeletal: Negative for arthritis, joint pain, muscle cramps, myalgias, neck pain and stiffness.  Gastrointestinal: Negative for abdominal pain, bowel incontinence, diarrhea and excessive appetite.  Genitourinary: Negative for decreased libido, genital sores and incomplete emptying.  Neurological: Negative  for brief paralysis, focal weakness, headaches and loss of balance.  Psychiatric/Behavioral: Negative for altered mental status, depression and suicidal ideas.  Allergic/Immunologic: Negative for HIV exposure and persistent infections.    EKGs/Labs/Other Studies Reviewed:    The following studies were reviewed today:   EKG:  The ekg ordered today demonstrates sinus rhythm Heart rate 72 bpm.  November 04, 2020 Transthoracic echocardiogram IMPRESSIONS   1. Left ventricular ejection fraction, by estimation, is 60 to 65%. The left ventricle has normal function. The left ventricle has no regional wall motion abnormalities. Left ventricular diastolic parameters were normal.   2. Right ventricular systolic function is normal. The right ventricular size is normal. There is normal pulmonary artery systolic pressure. The estimated right ventricular systolic pressure is 23.1 mmHg.   3. The mitral valve is grossly normal. Trivial mitral valve regurgitation. No evidence of mitral stenosis.   4. The aortic valve is tricuspid. Aortic valve regurgitation is not visualized. No aortic stenosis is present.   5. The inferior vena cava is normal in size with greater than 50% respiratory variability, suggesting right atrial pressure of 3 mmHg.     The patient wore the monitor for 12 days 22 hours hours starting October 16, 2020. Indication: Palpitations   The minimum heart rate was 43 bpm, maximum heart rate was 169 bpm, and average heart rate was 81 bpm. Predominant underlying rhythm was Sinus Rhythm.    54 Supraventricular Tachycardia runs occurred, the run with the fastest interval lasting 8 beats with a maximum rate of 169 bpm (average 54 bpm); the run with the fastest interval was also the longest.   Premature atrial complexes were occasional (4.0%,57584). Premature Ventricular complexes were rare less than 1%.   No ventricular tachycardia, no pauses, No AV block and no atrial fibrillation present. 97  patient triggered events: One associated with supraventricular tachycardia and the remaining associated with premature atrial complexes sinus rhythm.   Conclusion: This study is remarkable for the following:                                  1. Symptomatic occasional premature atrial complex.  2. Supraventricular tachycardia which is likely atrial tachycardia with variable block.  Recent Labs: 01/28/2023: ALT 19; Hemoglobin 14.9; Platelets 242; TSH 7.670 02/15/2023: BUN 12; Creatinine, Ser 0.88; Potassium 4.6; Sodium 136  Recent Lipid Panel    Component Value Date/Time   CHOL 214 (H) 02/28/2020 0907   TRIG 96 02/28/2020 0907   HDL 58 02/28/2020 0907   CHOLHDL 3.7 02/28/2020 0907   LDLCALC 139 (H) 02/28/2020 0907    Physical Exam:    VS:  BP 132/88 (BP Location: Right Arm, Patient Position: Sitting, Cuff Size: Large)   Pulse 71   Ht 5\' 3"  (1.6 m)   Wt 231 lb (104.8 kg)   SpO2 96%   BMI 40.92 kg/m     Wt Readings from Last 3 Encounters:  05/09/23 231 lb (104.8 kg)  02/23/23 229 lb 3.2 oz (104 kg)  02/03/23 233 lb (105.7 kg)     GEN: Well nourished, well developed in no acute distress HEENT: Normal NECK: No JVD; No carotid bruits LYMPHATICS: No lymphadenopathy CARDIAC: S1S2 noted,RRR, no murmurs, rubs, gallops RESPIRATORY:  Clear to auscultation without rales, wheezing or rhonchi  ABDOMEN: Soft, non-tender, non-distended, +bowel sounds, no guarding. EXTREMITIES: No edema, No cyanosis, no clubbing MUSCULOSKELETAL:  No deformity  SKIN: Warm and dry NEUROLOGIC:  Alert and oriented x 3, non-focal PSYCHIATRIC:  Normal affect, good insight  ASSESSMENT:    1. Medication management   2. PAT (paroxysmal atrial tachycardia)   3. Primary hypertension   4. Morbid obesity (HCC)    PLAN:     Paroxysmal SVT/PAT-will continue the flecainide and propranolol.  Recent flecainide level normal.  Hypertension - she is on Olmesartan , will continue this  medication.   Mixed hyperlipidemia-she prefers diet modification.  The patient understands the need to lose weight with diet and exercise. We have discussed specific strategies for this.  The patient is in agreement with the above plan. The patient left the office in stable condition.  The patient will follow up in 1 year or sooner if needed.   Medication Adjustments/Labs and Tests Ordered: Current medicines are reviewed at length with the patient today.  Concerns regarding medicines are outlined above.  Orders Placed This Encounter  Procedures   Comprehensive Metabolic Panel (CMET)   Magnesium   Flecainide level   No orders of the defined types were placed in this encounter.   Patient Instructions  Medication Instructions:  Your physician recommends that you continue on your current medications as directed. Please refer to the Current Medication list given to you today.  *If you need a refill on your cardiac medications before your next appointment, please call your pharmacy*   Lab Work: Your physician recommends that you return for lab work in: CMET, Mag, Flecainide  If you have labs (blood work) drawn today and your tests are completely normal, you will receive your results only by: MyChart Message (if you have MyChart) OR A paper copy in the mail If you have any lab test that is abnormal or we need to change your treatment, we will call you to review the results.   Testing/Procedures: None   Follow-Up: At [facility], you and your health needs are our priority.  As part of our continuing mission to provide you with exceptional heart care, we have created designated Provider Care Teams.  These Care Teams include your primary Cardiologist (physician) and Advanced Practice Providers (APPs -  Physician Assistants and Nurse Practitioners) who all work together to provide  you with the care you need, when you need it.  Your next appointment:   9 month(s)  Provider:    Thomasene Ripple, DO      Adopting a Healthy Lifestyle.  Know what a healthy weight is for you (roughly BMI <25) and aim to maintain this   Aim for 7+ servings of fruits and vegetables daily   65-80+ fluid ounces of water or unsweet tea for healthy kidneys   Limit to max 1 drink of alcohol per day; avoid smoking/tobacco   Limit animal fats in diet for cholesterol and heart health - choose grass fed whenever available   Avoid highly processed foods, and foods high in saturated/trans fats   Aim for low stress - take time to unwind and care for your mental health   Aim for 150 min of moderate intensity exercise weekly for heart health, and weights twice weekly for bone health   Aim for 7-9 hours of sleep daily   When it comes to diets, agreement about the perfect plan isnt easy to find, even among the experts. Experts at the [facility] [employer] developed an idea known as the Healthy Eating Plate. Just imagine a plate divided into logical, healthy portions.   The emphasis is on diet quality:   Load up on vegetables and fruits - one-half of your plate: Aim for color and variety, and remember that potatoes dont count.   Go for whole grains - one-quarter of your plate: Whole wheat, barley, wheat berries, quinoa, oats, brown rice, and foods made with them. If you want pasta, go with whole wheat pasta.   Protein power - one-quarter of your plate: Fish, chicken, beans, and nuts are all healthy, versatile protein sources. Limit red meat.   The diet, however, does go beyond the plate, offering a few other suggestions.   Use healthy plant oils, such as olive, canola, soy, corn, sunflower and peanut. Check the labels, and avoid partially hydrogenated oil, which have unhealthy trans fats.   If youre thirsty, drink water. Coffee and tea are good in moderation, but skip sugary drinks and limit milk and dairy products to one or two daily servings.   The type of carbohydrate in the  diet is more important than the amount. Some sources of carbohydrates, such as vegetables, fruits, whole grains, and beans-are healthier than others.   Finally, stay active  Signed, Cassidy Ripple, DO  05/09/2023 8:49 PM    [facility]

## 2023-06-20 ENCOUNTER — Ambulatory Visit: Payer: Self-pay

## 2023-06-20 NOTE — Patient Outreach (Signed)
Care Coordination   06/20/2023 Name: Cassidy [name] MRN: [id number] DOB: 1981/03/31   Care Coordination Outreach Attempts:  An unsuccessful telephone outreach was attempted for a scheduled appointment today.  Follow Up Plan:  Additional outreach attempts will be made to offer the patient care coordination information and services.   Encounter Outcome:  No Answer   Care Coordination Interventions:  No, not indicated    Delsa Sale RN BSN CCM [place]  [name] Nurse Care Coordinator  Direct Dial: [phone] Website: [email or web address]

## 2023-06-27 ENCOUNTER — Telehealth: Payer: Self-pay | Admitting: *Deleted

## 2023-06-27 NOTE — Progress Notes (Signed)
  Care Coordination Note  06/27/2023 Name: Cassidy [name] MRN: [id number] DOB: [birth year]  Cassidy [name] is a 42 y.o. year old female who is a primary care patient of Clayborne Dana, NP and is actively engaged with the care management team. I reached out to Cassidy [name] by phone today to assist with re-scheduling a follow up visit with the RN Case Manager  Follow up plan: Unsuccessful telephone outreach attempt made. A HIPAA compliant phone message was left for the patient providing contact information and requesting a return call.   [facility]  Care Coordination Care Guide  Direct Dial: [phone]

## 2023-07-04 NOTE — Progress Notes (Signed)
  Care Coordination Note  07/04/2023 Name: Cassidy [name] MRN: [id number] DOB: January 06, 1981  Cassidy [name] is a 42 y.o. year old female who is a primary care patient of Clayborne Dana, NP and is actively engaged with the care management team. I reached out to Cassidy [name] by phone today to assist with re-scheduling a follow up visit with the RN Case Manager  Follow up plan: Unsuccessful telephone outreach attempt made. A HIPAA compliant phone message was left for the patient providing contact information and requesting a return call.  We have been unable to make contact with the patient for follow up. The care management team is available to follow up with the patient after provider conversation with the patient regarding recommendation for care management engagement and subsequent re-referral to the care management team.   [facility] Coordination Care Guide  Direct Dial: [phone]

## 2023-07-14 ENCOUNTER — Ambulatory Visit: Payer: 59 | Admitting: Urgent Care

## 2023-07-14 ENCOUNTER — Telehealth: Payer: Self-pay | Admitting: Family Medicine

## 2023-07-14 ENCOUNTER — Encounter: Payer: Self-pay | Admitting: Urgent Care

## 2023-07-14 VITALS — BP 122/90 | HR 91 | Temp 98.1°F | Wt 235.4 lb

## 2023-07-14 DIAGNOSIS — M542 Cervicalgia: Secondary | ICD-10-CM | POA: Diagnosis not present

## 2023-07-14 DIAGNOSIS — I4719 Other supraventricular tachycardia: Secondary | ICD-10-CM

## 2023-07-14 DIAGNOSIS — I10 Essential (primary) hypertension: Secondary | ICD-10-CM

## 2023-07-14 DIAGNOSIS — D6861 Antiphospholipid syndrome: Secondary | ICD-10-CM

## 2023-07-14 DIAGNOSIS — K219 Gastro-esophageal reflux disease without esophagitis: Secondary | ICD-10-CM | POA: Diagnosis not present

## 2023-07-14 DIAGNOSIS — E061 Subacute thyroiditis: Secondary | ICD-10-CM | POA: Diagnosis not present

## 2023-07-14 MED ORDER — PANTOPRAZOLE SODIUM 40 MG PO TBEC: 40.0000 mg | DELAYED_RELEASE_TABLET | Freq: Every day | ORAL | 0 refills | Status: DC

## 2023-07-14 MED ORDER — PREDNISONE 20 MG PO TABS: 40.0000 mg | ORAL_TABLET | Freq: Every day | ORAL | 0 refills | Status: AC

## 2023-07-14 MED ORDER — BACLOFEN 10 MG PO TABS: 10.0000 mg | ORAL_TABLET | Freq: Three times a day (TID) | ORAL | 0 refills | Status: DC

## 2023-07-14 NOTE — Assessment & Plan Note (Signed)
Managed with daily beta blocker and flecainide. Pt is asx today, however concerned that her thyroid may contribute to cardiac issues if not regulated. Checking thyroid panel today.

## 2023-07-14 NOTE — Telephone Encounter (Signed)
FYI: This call has been transferred to triage nurse: the Triage Nurse. Once the result note has been entered staff can address the message at that time.  Patient called in with the following symptoms:  Red Word:chest pain and Headache   Please advise at Mobile [phone] (mobile)  Message is routed to Provider Pool.

## 2023-07-14 NOTE — Assessment & Plan Note (Signed)
Appears controlled - diastolic up a bit from baseline, likely secondary to pain level during encounter. Monitor

## 2023-07-14 NOTE — Progress Notes (Signed)
Established Patient Office Visit  Subjective:  Patient ID: Cassidy [name], female    DOB: Feb 19, 1981  Age: 42 y.o. MRN: [id number]  Chief Complaint  Patient presents with   Headache    States she woke up this morning with pain in her neck and  throbbing headache. She states she has not been feeling well since. She states it feels like the pain is coming from here thyroid. It hurts to talk and breathe.    41yo patient, with a history of autoimmune disorder, antiphospholipid antibody syndrome, and Hashimotos thyroid disease, presents with acute onset of right-sided neck pain that started abruptly in the morning. The pain, describes it as severe and different from a typical neck strain, radiates from behind the ear, down the side of the neck, and into the anterior chest around the area of the manubrium. The patient reports a sensation of a knot in the neck and pressure, with a strong palpable pulse. The pain is exacerbated by deep breathing, causing a burning sensation in the lungs, and by certain neck movements. Palpating the neck muscles reproduces the pain. The patient denies any back pain or shoulder pain. No headaches. No SOB, tachycardia, palpitations or chest pain. No jaw or ear pain. No fevers.  The patient also reports difficulty swallowing, particularly when taking pills, and describes a sensation of something lodged in the throat. They have been experiencing a chronic cough, which they attribute to allergies and have been managing with Mucinex. This is not changed from baseline. They deny any fever, ear pain, or changes in hearing.  The patient has a history of Hashimotos hypothyroidism, currently managed with levothyroxine 200 mcg, and has recently had their dosage increased. (May 2024, has not been rechecked since). They also have a history of supraventricular tachycardia (SVT), managed with lifelong flecainide, which started three months after receiving the COVID-19 vaccine. The patient  denies any history of blood clots, except for one incident during a pregnancy 21 years ago.    Patient Active Problem List   Diagnosis Date Noted   Anxiety 02/03/2023   Primary hypertension 02/03/2023   Atrial fibrillation (HCC)    PAT (paroxysmal atrial tachycardia) (HCC)    Tachycardia    Restless leg syndrome    Morbid obesity (HCC)    Mixed hyperlipidemia    GERD (gastroesophageal reflux disease)    Autoimmune disorder (HCC)    Antiphospholipid antibody syndrome (HCC)    Hypothyroidism 09/03/2019   Past Medical History:  Diagnosis Date   Acquired hypothyroidism    endocrinologist-- Rudi Heap NP ([facility] [place])   Antiphospholipid antibody syndrome [facility])    hematology/ oncology--- dr Myna Hidalgo;  dx age 83 work-up done due to still birth (umbilical cord w/ clot)   Endometrial polyp    GERD (gastroesophageal reflux disease)    Irregular menses    Migraines    Mixed hyperlipidemia    PAT (paroxysmal atrial tachycardia) (HCC)    cardiologist--- dr Kirtland Bouchard. [facility];  long hx takes flecainide and propranolol;   event monitor 11-04-2020 symptomatic occasional PAC SVT likely PAT;  echo 10-29-2020 ef 6-65%   Postcoital bleeding    PSVT (paroxysmal supraventricular tachycardia) (HCC)    Restless leg syndrome    Uterine leiomyoma    Vertigo    Wears glasses       ROS: as noted in HPI  Objective:     BP (!) 122/90   Pulse 91   Temp 98.1 F (36.7 C) (Oral)   Wt  235 lb 6.4 oz (106.8 kg)   SpO2 97%   BMI 41.70 kg/m   BP Readings from Last 3 Encounters:  07/14/23 (!) 122/90  05/09/23 132/88  02/23/23 112/84      Physical Exam Vitals and nursing note reviewed.  Constitutional:      General: She is not in acute distress.    Appearance: Normal appearance. She is normal weight. She is not ill-appearing, toxic-appearing or diaphoretic.  HENT:     Head: Normocephalic and atraumatic. No abrasion or masses.     Jaw: There is normal jaw occlusion. No trismus, tenderness,  swelling or pain on movement.     Salivary Glands: Right salivary gland is not diffusely enlarged or tender. Left salivary gland is not diffusely enlarged or tender.     Right Ear: External ear normal.     Left Ear: External ear normal.     Nose: Nose normal.     Right Sinus: No maxillary sinus tenderness or frontal sinus tenderness.     Left Sinus: No maxillary sinus tenderness or frontal sinus tenderness.     Mouth/Throat:     Lips: Pink.     Pharynx: Oropharynx is clear. Uvula midline. No pharyngeal swelling, oropharyngeal exudate, posterior oropharyngeal erythema, uvula swelling or postnasal drip.  Neck:     Thyroid: Thyroid tenderness present. No thyroid mass or thyromegaly.     Vascular: Normal carotid pulses. No carotid bruit or JVD.     Trachea: Phonation normal. No tracheal tenderness, abnormal tracheal secretions or tracheal deviation.     Meningeal: Brudzinski's sign absent.   Cardiovascular:     Rate and Rhythm: Normal rate and regular rhythm.     Pulses: Normal pulses.  Pulmonary:     Effort: Pulmonary effort is normal. No respiratory distress.     Breath sounds: Normal breath sounds. No stridor. No wheezing, rhonchi or rales.  Chest:     Chest wall: No tenderness.  Musculoskeletal:     Cervical back: Normal range of motion. No edema or erythema. Pain with movement (with rotation to the R) and muscular tenderness (reproducible pain noted wiht palpation of the paracervical on R and SCM on R) present. No spinous process tenderness.  Lymphadenopathy:     Cervical: Cervical adenopathy (R sided submandibular) present.     Right cervical: No superficial or deep cervical adenopathy. Skin:    General: Skin is warm and dry.     Findings: No rash.  Neurological:     General: No focal deficit present.     Mental Status: She is alert and oriented to person, place, and time.     Sensory: No sensory deficit.     Motor: No weakness.     Gait: Gait normal.  Psychiatric:         Mood and Affect: Mood normal.        Behavior: Behavior normal.      No results found for any visits on 07/14/23.  Last thyroid functions Lab Results  Component Value Date   TSH 7.670 (H) 01/28/2023      The ASCVD Risk score (Arnett DK, et al., 2019) failed to calculate for the following reasons:   Cannot find a previous HDL lab   Cannot find a previous total cholesterol lab  Assessment & Plan:  Trigger point with neck pain -     Thyroid Panel With TSH -     Baclofen; Take 1 tablet (10 mg total) by mouth 3 (three) times daily.  Dispense: 30 each; Refill: 0 -     predniSONE; Take 2 tablets (40 mg total) by mouth daily with breakfast for 5 days.  Dispense: 10 tablet; Refill: 0  I was able to apply ischemic release pressure to R lateral neck in office, pt states that many of her symptoms improved. She states the pressure in her neck improved as did the anterior chest. She also states it resolved the deep breathing pressure sensation. Pt cannot take ASA or NSAIDs due to allergy, will therefore Rx prednisone and baclofen. Recommended moist heat with ischemic release massage to affected area of neck. Consider dry needling if refractory.  Subacute thyroiditis -     Thyroid Panel With TSH -     predniSONE; Take 2 tablets (40 mg total) by mouth daily with breakfast for 5 days.  Dispense: 10 tablet; Refill: 0  Likely viral. VSS and pt appears well. Afebrile. No abscess formation or indication for bacterial process. Suspect viral thyroiditis. Will tx with prednisone. Thyroid panel obtained to further assessment to determine if appropriate dose for her chronic hypothyroidism.  Antiphospholipid antibody syndrome (HCC) Assessment & Plan: Not on any blood thinners, but monitored by hematology per pt. Allergic to ASA. No recurrent blood clots, appears stable per pt. Current sx likely unrelated to chronic autoimmune syndrome   PAT (paroxysmal atrial tachycardia) (HCC) Assessment &  Plan: Managed with daily beta blocker and flecainide. Pt is asx today, however concerned that her thyroid may contribute to cardiac issues if not regulated. Checking thyroid panel today.   Primary hypertension Assessment & Plan: Appears controlled - diastolic up a bit from baseline, likely secondary to pain level during encounter. Monitor  Gerd      Assessment & Plan: Likely the cause of the dysphagia sensation and chronic cough. Pt was on pantoprazole in the past. Will restart.     No follow-ups on file.   Maretta Bees, PA

## 2023-07-14 NOTE — Assessment & Plan Note (Signed)
Not on any blood thinners, but monitored by hematology per pt. Allergic to ASA. No recurrent blood clots, appears stable per pt. Current sx likely unrelated to chronic autoimmune syndrome

## 2023-07-14 NOTE — Telephone Encounter (Signed)
Initial Comment States she has pain in her neck and headache. States when she started moving the headache went away and she has pressure and throbbing in her throat, states it hurts to breath, not SOB. States she can take a deep breath it hurts and goes into her chest. GOTO Facility Not Listed Called backline, booked [facility] for 4:20pm Lauren NP Translation No Nurse Assessment Nurse: Izora Ribas, RN, Melanie Date/Time (Eastern Time): 07/14/2023 11:01:02 AM Confirm and document reason for call. If symptomatic, describe symptoms. ---Caller states woke up with pain in neck and head, when started moving, headache went away. Pressure/ throbbing on right side of neck/throat, hurts to breath, but not SOB. Deep breath hurts in her lungs. Cough, Mucinex for a week HX: Allergies, Levothyroxine, Flecanide BID, Propranolol daily, HTN, SVT, blood clotting disorder Does the patient have any new or worsening symptoms? ---Yes Will a triage be completed? ---Yes Related visit to physician within the last 2 weeks? ---N/A Does the PT have any chronic conditions? (i.e. diabetes, asthma, this includes High risk factors for pregnancy, etc.) ---Yes Is the patient pregnant or possibly pregnant? (Ask all females between the ages of 95-55) ---No Is this a behavioral health or substance abuse call? ---No PLEASE NOTE: All timestamps contained within this report are represented as [place] Standard Time. CONFIDENTIALTY NOTICE: This fax transmission is intended only for the addressee. It contains information that is legally privileged, confidential or otherwise protected from use or disclosure. If you are not the intended recipient, you are strictly prohibited from reviewing, disclosing, copying using or disseminating any of this information or taking any action in reliance on or regarding this information. If you have received this fax in error, please notify us immediately by telephone so that we can arrange for  its return to [place]. Phone: [phone], Toll-Free: [phone], Fax: [phone] Page: 2 of 2 Call Id: [id number] Guidelines Guideline Title Affirmed Question Affirmed Notes Nurse Date/Time Lamount Cohen Time) Swallowing Difficulty Symptoms of food or bone stuck in throat or esophagus (e.g., pain in throat or chest, FB sensation, blood-tinged saliva) Izora Ribas, RN, Melanie 07/14/2023 11:09:17 AM Disp. Time Lamount Cohen Time) Disposition Final User 07/14/2023 11:00:04 AM Send to Urgent Queue Adriana Reams 07/14/2023 11:09:58 AM Go to ED Now Yes Izora Ribas, RN, Melanie Final Disposition 07/14/2023 11:09:58 AM Go to ED Now Yes Izora Ribas, RN, Mittie Bodo Disagree/Comply Disagree Caller Understands Yes PreDisposition Call Doctor Care Advice Given Per Guideline GO TO ED NOW: * You need to be seen in the Emergency Department. * Go to the ED at ___________ Hospital. NOTHING BY MOUTH: CARE ADVICE given per Swallowing Difficulty (Adult) guideline. Comments User: Patria Mane, RN Date/Time Lamount Cohen Time): 07/14/2023 11:08:20 AM Knot in throat when swallowing sometimes. Referrals GO TO FACILITY OTHER - SPECIFY

## 2023-07-14 NOTE — Patient Instructions (Addendum)
You have cervical trigger point, which is knots in the muscles of the neck.  Please apply a warm moist compress, such as a microwavable heating pack, to your neck several times daily. After each warm compress, apply the technique that we discussed today of ischemic release. This is a prolonged, deep pressure into the knot of the muscle to release the tension.  Take the muscle relaxer three times daily as needed. Keep in mind it may make you feel tired or drowsy, so do not operate machinery or drive a car until you know how it affects you.  Please take the prednisone once daily with food, best taken in the morning with breakfast.   If your symptoms persist, you would be a candidate for trigger point injection or dry needling.  Try to stay hydrated with WATER as dehydration and caffeine intake can worsen this condition.   Additionally, you appear to be suffering from acute thyroiditis which can often occur after a viral infection.   This is when the thyroid becomes inflamed. I am checking your thyroid hormone today to ensure it is within normal limits. We treat acute thyroiditis with prednisone.  To help with your cough and dysphagia sensation, start taking pantoprazole 30 min before breakfast in the morning. Follow up with your PCP to determine if this should be continued.  If you develop worsening pain, fever, or any new symptoms, please return to the clinic. Please keep your appointment with PCP in Nov.

## 2023-07-14 NOTE — Telephone Encounter (Signed)
Scheduled pt an appointment 07/14/2023.

## 2023-07-15 LAB — THYROID PANEL WITH TSH
Free Thyroxine Index: 3.5 (ref 1.4–3.8)
T3 Uptake: 35 % (ref 22–35)
T4, Total: 9.9 ug/dL (ref 5.1–11.9)
TSH: 2.31 m[IU]/L

## 2023-07-18 ENCOUNTER — Other Ambulatory Visit: Payer: Self-pay

## 2023-07-18 ENCOUNTER — Encounter: Payer: Self-pay | Admitting: Medical Oncology

## 2023-07-18 ENCOUNTER — Inpatient Hospital Stay (HOSPITAL_BASED_OUTPATIENT_CLINIC_OR_DEPARTMENT_OTHER): Payer: 59 | Admitting: Medical Oncology

## 2023-07-18 ENCOUNTER — Inpatient Hospital Stay: Payer: 59 | Attending: Hematology & Oncology

## 2023-07-18 VITALS — BP 144/89 | HR 76 | Temp 98.4°F | Resp 20 | Ht 63.0 in | Wt 236.0 lb

## 2023-07-18 DIAGNOSIS — Z7952 Long term (current) use of systemic steroids: Secondary | ICD-10-CM | POA: Insufficient documentation

## 2023-07-18 DIAGNOSIS — D6861 Antiphospholipid syndrome: Secondary | ICD-10-CM | POA: Diagnosis not present

## 2023-07-18 DIAGNOSIS — D509 Iron deficiency anemia, unspecified: Secondary | ICD-10-CM | POA: Insufficient documentation

## 2023-07-18 DIAGNOSIS — E069 Thyroiditis, unspecified: Secondary | ICD-10-CM | POA: Insufficient documentation

## 2023-07-18 DIAGNOSIS — E559 Vitamin D deficiency, unspecified: Secondary | ICD-10-CM

## 2023-07-18 LAB — IRON AND IRON BINDING CAPACITY (CC-WL,HP ONLY)
Iron: 109 ug/dL (ref 28–170)
Saturation Ratios: 27 % (ref 10.4–31.8)
TIBC: 410 ug/dL (ref 250–450)
UIBC: 301 ug/dL (ref 148–442)

## 2023-07-18 LAB — CBC WITH DIFFERENTIAL (CANCER CENTER ONLY)
Abs Immature Granulocytes: 0.18 10*3/uL — ABNORMAL HIGH (ref 0.00–0.07)
Basophils Absolute: 0.1 10*3/uL (ref 0.0–0.1)
Basophils Relative: 1 %
Eosinophils Absolute: 0.1 10*3/uL (ref 0.0–0.5)
Eosinophils Relative: 1 %
HCT: 45.6 % (ref 36.0–46.0)
Hemoglobin: 15.2 g/dL — ABNORMAL HIGH (ref 12.0–15.0)
Immature Granulocytes: 1 %
Lymphocytes Relative: 25 %
Lymphs Abs: 4 10*3/uL (ref 0.7–4.0)
MCH: 32.5 pg (ref 26.0–34.0)
MCHC: 33.3 g/dL (ref 30.0–36.0)
MCV: 97.4 fL (ref 80.0–100.0)
Monocytes Absolute: 1 10*3/uL (ref 0.1–1.0)
Monocytes Relative: 6 %
Neutro Abs: 11 10*3/uL — ABNORMAL HIGH (ref 1.7–7.7)
Neutrophils Relative %: 66 %
RBC: 4.68 MIL/uL (ref 3.87–5.11)
RDW: 13.3 % (ref 11.5–15.5)
WBC Count: 16.3 10*3/uL — ABNORMAL HIGH (ref 4.0–10.5)
nRBC: 0 % (ref 0.0–0.2)

## 2023-07-18 LAB — FERRITIN: Ferritin: 58 ng/mL (ref 11–307)

## 2023-07-18 LAB — VITAMIN D 25 HYDROXY (VIT D DEFICIENCY, FRACTURES): Vit D, 25-Hydroxy: 27.35 ng/mL — ABNORMAL LOW (ref 30–100)

## 2023-07-18 NOTE — Progress Notes (Signed)
BP remains elevated, instructed to monitor at home and if it remains over 140/90, notify PCP. Verbalized understanding.

## 2023-07-18 NOTE — Progress Notes (Signed)
Hematology and Oncology Follow Up Visit  Cassidy [name] [id number] June 25, 1981 42 y.o. 07/18/2023   Principle Diagnosis:  Antiphospholipid antibody syndrome with history of miscarriage  Current Therapy:   Observation   Interim History:  Cassidy [name] is here today for follow-up.   Today she reports that she is doing ok. She reports that she has had some thyroid issues recently- thyroiditis. She is on 40 mg of prednisone for this and feeling better. She stops this regimen in 2 days and has follow up with her PCP within the next 2 weeks.   She does state that she is not taking her vitamin D on a regular basis due to forgetting to take this medication.  Her cycles are regular with lighter flow. No other blood loss noted.   No fever, chills, n/v, rash, dizziness, SOB, chest pain, palpitations, abdominal pain or changes in bowel or bladder habits.  She varies between constipation and diarrhea and has some abdominal bloating at times.  No swelling, tenderness, numbness or tingling in her extremities. She has occasional puffiness in her hands that goes away at night. She states that she avoids salt as a trigger.  No falls or syncope reported.  Appetite and hydration are good.   Wt Readings from Last 3 Encounters:  07/18/23 236 lb 0.6 oz (107.1 kg)  07/14/23 235 lb 6.4 oz (106.8 kg)  05/09/23 231 lb (104.8 kg)    ECOG Performance Status: 1 - Symptomatic but completely ambulatory  Medications:  Allergies as of 07/18/2023       Reactions   Statins    Muscle aches   Benadryl [diphenhydramine] Other (See Comments)   "restless leg, so I need gabapentin with it"   Ciprofloxacin    Flu-like symptoms with muscle aches   Nsaids Hives   PER PT THIS INCLUDES ALL TYPES OF NSAIDS THAT CAUSE HIVES   Other    Per pt paprika causes tachycardia   Tolmetin Hives   Aspirin Hives        Medication List        Accurate as of July 18, 2023 10:33 AM. If you have any questions, ask your nurse  or doctor.          baclofen 10 MG tablet Commonly known as: LIORESAL Take 1 tablet (10 mg total) by mouth 3 (three) times daily.   flecainide 100 MG tablet Commonly known as: TAMBOCOR TAKE 1 TABLET BY MOUTH TWICE A DAY   levothyroxine 200 MCG tablet Commonly known as: SYNTHROID Take 200 mcg by mouth daily before breakfast.   Melatonin 2.5 MG Chew Chew 5 mg by mouth at bedtime.   olmesartan 20 MG tablet Commonly known as: BENICAR Take 1 tablet (20 mg total) by mouth daily.   pantoprazole 40 MG tablet Commonly known as: Protonix Take 1 tablet (40 mg total) by mouth daily.   predniSONE 20 MG tablet Commonly known as: DELTASONE Take 2 tablets (40 mg total) by mouth daily with breakfast for 5 days.   propranolol 10 MG tablet Commonly known as: INDERAL Take 1 tablet (10 mg total) by mouth daily.        Allergies:  Allergies  Allergen Reactions   Statins     Muscle aches   Benadryl [Diphenhydramine] Other (See Comments)    "restless leg, so I need gabapentin with it"   Ciprofloxacin     Flu-like symptoms with muscle aches   Nsaids Hives    PER PT THIS INCLUDES ALL TYPES OF  NSAIDS THAT CAUSE HIVES   Other     Per pt paprika causes tachycardia   Tolmetin Hives   Aspirin Hives    Past Medical History, Surgical history, Social history, and Family History were reviewed and updated.  Review of Systems: All other 10 point review of systems is negative.   Physical Exam:  height is 5\' 3"  (1.6 m) and weight is 236 lb 0.6 oz (107.1 kg). Her oral temperature is 98.4 F (36.9 C). Her blood pressure is 144/89 (abnormal) and her pulse is 76. Her respiration is 20 and oxygen saturation is 100%.   Wt Readings from Last 3 Encounters:  07/18/23 236 lb 0.6 oz (107.1 kg)  07/14/23 235 lb 6.4 oz (106.8 kg)  05/09/23 231 lb (104.8 kg)    Ocular: Sclerae unicteric, pupils equal, round and reactive to light Ear-nose-throat: Oropharynx clear, dentition fair Lymphatic: No  cervical or supraclavicular adenopathy Lungs no rales or rhonchi, good excursion bilaterally Heart regular rate and rhythm, no murmur appreciated Abd soft, nontender, positive bowel sounds MSK no focal spinal tenderness, no joint edema Neuro: non-focal, well-oriented, appropriate affect   Lab Results  Component Value Date   WBC 16.3 (H) 07/18/2023   HGB 15.2 (H) 07/18/2023   HCT 45.6 07/18/2023   MCV 97.4 07/18/2023   PLT PLATELET CLUMPS NOTED ON SMEAR 07/18/2023   Lab Results  Component Value Date   FERRITIN 20 07/13/2022   IRON 105 07/13/2022   TIBC 489 (H) 07/13/2022   UIBC 384 07/13/2022   IRONPCTSAT 22 07/13/2022   Lab Results  Component Value Date   RBC 4.68 07/18/2023   No results found for: "KPAFRELGTCHN", "LAMBDASER", "KAPLAMBRATIO" No results found for: "IGGSERUM", "IGA", "IGMSERUM" No results found for: "TOTALPROTELP", "ALBUMINELP", "A1GS", "A2GS", "BETS", "BETA2SER", "GAMS", "MSPIKE", "SPEI"   Chemistry      Component Value Date/Time   NA 136 02/15/2023 1217   K 4.6 02/15/2023 1217   CL 101 02/15/2023 1217   CO2 21 02/15/2023 1217   BUN 12 02/15/2023 1217   CREATININE 0.88 02/15/2023 1217   CREATININE 1.12 (H) 07/13/2022 0817      Component Value Date/Time   CALCIUM 9.2 02/15/2023 1217   ALKPHOS 58 01/28/2023 1143   AST 18 01/28/2023 1143   AST 14 (L) 07/13/2022 0817   ALT 19 01/28/2023 1143   ALT 14 07/13/2022 0817   BILITOT 0.6 01/28/2023 1143   BILITOT 1.0 07/13/2022 0817      Encounter Diagnoses  Name Primary?   Antiphospholipid antibody syndrome (HCC) Yes   Iron deficiency anemia, unspecified iron deficiency anemia type    Vitamin D deficiency     Impression and Plan: Cassidy [name] is a very pleasant 42 yo caucasian female with history of antiphospholipid antibody syndrome discovered after she experience a still birth 18 years ago. She refuses Coumadin. She is allergic to aspirin/NSAIDs. So far, she has not had another thrombotic event. We also  follow her iron levels for her history of fatigue.   Labs pending today.  RTC 1 year, labs (CBC, iron, ferritin, vitamin D)-[place]  Rushie Chestnut, PA-C 10/21/202410:33 AM

## 2023-08-08 ENCOUNTER — Ambulatory Visit: Payer: 59 | Admitting: Family Medicine

## 2023-08-08 ENCOUNTER — Encounter: Payer: Self-pay | Admitting: Family Medicine

## 2023-08-08 VITALS — BP 109/72 | HR 94 | Ht 63.0 in | Wt 238.0 lb

## 2023-08-08 DIAGNOSIS — I10 Essential (primary) hypertension: Secondary | ICD-10-CM | POA: Diagnosis not present

## 2023-08-08 DIAGNOSIS — Z6841 Body Mass Index (BMI) 40.0 and over, adult: Secondary | ICD-10-CM

## 2023-08-08 DIAGNOSIS — I4891 Unspecified atrial fibrillation: Secondary | ICD-10-CM

## 2023-08-08 DIAGNOSIS — F419 Anxiety disorder, unspecified: Secondary | ICD-10-CM

## 2023-08-08 DIAGNOSIS — Z79899 Other long term (current) drug therapy: Secondary | ICD-10-CM | POA: Diagnosis not present

## 2023-08-08 DIAGNOSIS — E782 Mixed hyperlipidemia: Secondary | ICD-10-CM

## 2023-08-08 DIAGNOSIS — E063 Autoimmune thyroiditis: Secondary | ICD-10-CM

## 2023-08-08 NOTE — Progress Notes (Signed)
Established Patient Office Visit  Subjective   Patient ID: Cassidy [name], female    DOB: March 12, 1981  Age: 42 y.o. MRN: [id number]  Chief Complaint  Patient presents with   Medical Management of Chronic Issues    HPI   Patient is here for routine follow-up. 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 No acute concerns today.    Hypothyroidism: - Management: levothyroxine 200 mcg daily -Taking medications as prescribed in the morning, apart from other foods, meds, vitamins, etc.  -No recent changes to hair, skin, nails, energy levels -Labs normal 3 weeks ago, defer recheck today -Recently treated with prednisone for subacute thyroiditis. Symptoms have resolved.   Hyperlipidemia: - medications: none, lifestyle measures  - compliance: n/a - medication SEs: n/a The ASCVD Risk score (Arnett DK, et al., 2019) failed to calculate for the following reasons:   Cannot find a previous HDL lab   Cannot find a previous total cholesterol lab   Hypertension, A. Fib - Medications: flecainide 100 mg BID, olmesartan 20 mg daily, propranolol 10 mg daily - Compliance: good - Checking BP at home: not usually - Denies any SOB, recurrent headaches, CP, vision changes, LE edema, dizziness, palpitations, or medication side effects. - Diet: general, low fat, no added salt - Exercise: none - Following with cardiology   Mood follow-up: - Diagnosis: anxiety - Treatment: propranolol daily for cardiac reasons - Medication side effects: no - SI/HI: no - Update: She is doing much better since switching to day shift. Does not feel like she needs additional management at this time.           ROS All review of systems negative except what is listed in the HPI     Objective:     BP 109/72   Pulse 94   Ht 5\' 3"  (1.6 m)   Wt 238 lb (108 kg)   SpO2 100%   BMI 42.16 kg/m    Physical Exam Vitals reviewed.  Constitutional:      General: She is not in acute  distress.    Appearance: Normal appearance. She is obese. She is not ill-appearing.  Cardiovascular:     Rate and Rhythm: Normal rate and regular rhythm.     Pulses: Normal pulses.     Heart sounds: Normal heart sounds.  Pulmonary:     Effort: Pulmonary effort is normal.     Breath sounds: Normal breath sounds.  Musculoskeletal:     Cervical back: No tenderness.  Skin:    General: Skin is warm and dry.  Neurological:     Mental Status: She is alert and oriented to person, place, and time.  Psychiatric:        Mood and Affect: Mood normal.        Behavior: Behavior normal.        Thought Content: Thought content normal.        Judgment: Judgment normal.        No results found for any visits on 08/08/23.    The ASCVD Risk score (Arnett DK, et al., 2019) failed to calculate for the following reasons:   Cannot find a previous HDL lab   Cannot find a previous total cholesterol lab    Assessment & Plan:   Problem List Items Addressed This Visit       Active Problems   Hypothyroidism    Working on scheduling endocrinology appointment Continue Synthroid at current dose - TSH stable 3 weeks  ago, defer recheck today No new symptoms         Morbid obesity (HCC)    Encouraged healthy lifestyle       Mixed hyperlipidemia - Primary    Medication management: lifestyle measures only Lifestyle factors for lowering cholesterol include: Diet therapy - heart-healthy diet rich in fruits, veggies, fiber-rich whole grains, lean meats, chicken, fish (at least twice a week), fat-free or 1% dairy products; foods low in saturated/trans fats, cholesterol, sodium, and sugar. Mediterranean diet has shown to be very heart healthy. Regular exercise - recommend at least 30 minutes a day, 5 times per week Weight management  Lipid panel today, CMP ordered by cardiology to be done today also       Relevant Orders   Lipid panel   Atrial fibrillation (HCC)    Stable. No acute  concerns Continue current meds Following with cardiology       Anxiety    No SI/HI Doing much better since switching to day shift work  No acute concerns       Primary hypertension    Blood pressure is at goal for age and co-morbidities.   Recommendations: continue olmesartan, propranolol  - BP goal <130/80 - monitor and log blood pressures at home - check around the same time each day in a relaxed setting - Limit salt to <2000 mg/day - Follow DASH eating plan (heart healthy diet) - limit alcohol to 2 standard drinks per day for men and 1 per day for women - avoid tobacco products - get at least 2 hours of regular aerobic exercise weekly Patient aware of signs/symptoms requiring further/urgent evaluation.         Return in about 6 months (around 02/05/2024) for routine follow-up.    Clayborne Dana, NP

## 2023-08-08 NOTE — Assessment & Plan Note (Signed)
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

## 2023-08-08 NOTE — Assessment & Plan Note (Signed)
Blood pressure is at goal for age and co-morbidities.   Recommendations: continue olmesartan, propranolol  - BP goal <130/80 - monitor and log blood pressures at home - check around the same time each day in a relaxed setting - Limit salt to <2000 mg/day - Follow DASH eating plan (heart healthy diet) - limit alcohol to 2 standard drinks per day for men and 1 per day for women - avoid tobacco products - get at least 2 hours of regular aerobic exercise weekly Patient aware of signs/symptoms requiring further/urgent evaluation.

## 2023-08-08 NOTE — Assessment & Plan Note (Signed)
Medication management: lifestyle measures only Lifestyle factors for lowering cholesterol include: Diet therapy - heart-healthy diet rich in fruits, veggies, fiber-rich whole grains, lean meats, chicken, fish (at least twice a week), fat-free or 1% dairy products; foods low in saturated/trans fats, cholesterol, sodium, and sugar. Mediterranean diet has shown to be very heart healthy. Regular exercise - recommend at least 30 minutes a day, 5 times per week Weight management  Lipid panel today, CMP ordered by cardiology to be done today also

## 2023-08-08 NOTE — Assessment & Plan Note (Signed)
Stable. No acute concerns Continue current meds Following with cardiology

## 2023-08-08 NOTE — Assessment & Plan Note (Signed)
Working on scheduling endocrinology appointment Continue Synthroid at current dose - TSH stable 3 weeks ago, defer recheck today No new symptoms

## 2023-08-08 NOTE — Assessment & Plan Note (Signed)
Encouraged healthy lifestyle

## 2023-08-09 LAB — LIPID PANEL
Chol/HDL Ratio: 4.8 ratio — ABNORMAL HIGH (ref 0.0–4.4)
Cholesterol, Total: 247 mg/dL — ABNORMAL HIGH (ref 100–199)
HDL: 51 mg/dL (ref >39)
LDL Chol Calc (NIH): 155 mg/dL — ABNORMAL HIGH (ref 0–99)
Triglycerides: 226 mg/dL — ABNORMAL HIGH (ref 0–149)
VLDL Cholesterol Cal: 41 mg/dL — ABNORMAL HIGH (ref 5–40)

## 2023-08-12 ENCOUNTER — Telehealth: Payer: Self-pay | Admitting: Cardiology

## 2023-08-12 NOTE — Telephone Encounter (Signed)
Calling to say the lab results will be late for Flecainide, because of instrument issue. Please advise

## 2023-08-16 ENCOUNTER — Other Ambulatory Visit: Payer: Self-pay | Admitting: Neurology

## 2023-08-16 MED ORDER — PANTOPRAZOLE SODIUM 40 MG PO TBEC: 40.0000 mg | DELAYED_RELEASE_TABLET | Freq: Every day | ORAL | 3 refills | Status: DC

## 2023-08-20 LAB — COMPREHENSIVE METABOLIC PANEL
ALT: 26 [IU]/L (ref 0–32)
AST: 21 [IU]/L (ref 0–40)
Albumin: 4.2 g/dL (ref 3.9–4.9)
Alkaline Phosphatase: 73 [IU]/L (ref 44–121)
BUN/Creatinine Ratio: 9 (ref 9–23)
BUN: 9 mg/dL (ref 6–24)
Bilirubin Total: 0.8 mg/dL (ref 0.0–1.2)
CO2: 19 mmol/L — ABNORMAL LOW (ref 20–29)
Calcium: 9.1 mg/dL (ref 8.7–10.2)
Chloride: 103 mmol/L (ref 96–106)
Creatinine, Ser: 0.96 mg/dL (ref 0.57–1.00)
Globulin, Total: 2.8 g/dL (ref 1.5–4.5)
Glucose: 96 mg/dL (ref 70–99)
Potassium: 4.6 mmol/L (ref 3.5–5.2)
Sodium: 138 mmol/L (ref 134–144)
Total Protein: 7 g/dL (ref 6.0–8.5)
eGFR: 75 mL/min/{1.73_m2} (ref >59)

## 2023-08-20 LAB — MAGNESIUM: Magnesium: 2 mg/dL (ref 1.6–2.3)

## 2023-08-20 LAB — FLECAINIDE LEVEL: Flecainide: 0.46 ug/mL (ref 0.20–1.00)

## 2023-09-19 ENCOUNTER — Telehealth: Payer: Self-pay | Admitting: Neurology

## 2023-09-19 ENCOUNTER — Encounter: Payer: Self-pay | Admitting: Family Medicine

## 2023-09-19 ENCOUNTER — Ambulatory Visit: Payer: 59 | Admitting: Family Medicine

## 2023-09-19 ENCOUNTER — Ambulatory Visit (HOSPITAL_BASED_OUTPATIENT_CLINIC_OR_DEPARTMENT_OTHER)
Admission: RE | Admit: 2023-09-19 | Discharge: 2023-09-19 | Disposition: A | Discharge status: Home / Self Care | Payer: 59 | Source: Ambulatory Visit | Attending: Family Medicine | Admitting: Family Medicine

## 2023-09-19 VITALS — BP 124/79 | HR 77 | Temp 98.1°F | Ht 63.0 in | Wt 238.0 lb

## 2023-09-19 DIAGNOSIS — R042 Hemoptysis: Secondary | ICD-10-CM | POA: Diagnosis not present

## 2023-09-19 DIAGNOSIS — R918 Other nonspecific abnormal finding of lung field: Secondary | ICD-10-CM | POA: Diagnosis not present

## 2023-09-19 DIAGNOSIS — J988 Other specified respiratory disorders: Secondary | ICD-10-CM | POA: Insufficient documentation

## 2023-09-19 DIAGNOSIS — Z8616 Personal history of COVID-19: Secondary | ICD-10-CM | POA: Diagnosis not present

## 2023-09-19 DIAGNOSIS — R059 Cough, unspecified: Secondary | ICD-10-CM | POA: Diagnosis not present

## 2023-09-19 MED ORDER — AMOXICILLIN-POT CLAVULANATE 875-125 MG PO TABS: 1.0000 | ORAL_TABLET | Freq: Two times a day (BID) | ORAL | 0 refills | Status: AC

## 2023-09-19 MED ORDER — FLUCONAZOLE 150 MG PO TABS: 150.0000 mg | ORAL_TABLET | Freq: Once | ORAL | 0 refills | Status: AC

## 2023-09-19 NOTE — Progress Notes (Signed)
No acute changes on chest xray. Continue with plan we discussed. Antibiotics for 7 days and supportive measures. Follow-up if not improving.

## 2023-09-19 NOTE — Telephone Encounter (Signed)
  Home Covid test positive - 09/10/23 Isolated and went back to work after 5 days   Wife tested positive this past Friday (09/16/2023)  Started coughing and lost voice yesterday  Took tessalon pearl and it helped  No other symptoms   Okay to overbook per [employer]. Appt scheduled.

## 2023-09-19 NOTE — Progress Notes (Signed)
Acute Office Visit  Subjective:     Patient ID: Cassidy [name], female    DOB: 07/28/1981, 42 y.o.   MRN: [id number]  Chief Complaint  Patient presents with   Cough    HPI Patient is in today for cough.   Discussed the use of AI scribe software for clinical note transcription with the patient, who gave verbal consent to proceed.  History of Present Illness   The patient presents approximately ten days post-COVID-19 diagnosis. She initially experienced a period of improvement, returning to work with a persistent cough. However, she reports a sudden loss of voice and severe coughing fits that almost led to loss of consciousness. The patient describes the cough as having a bad taste and is concerned about a potential infection due to a history of pneumonia. She also reports blood in sputum, but is unsure if it originates from the chest or nose, which is also producing green and bloody discharge.  The patient has been managing the cough with Tessalon Perles, which have provided temporary relief and aided sleep. She also reports liquid diarrhea, which started the day prior to the consultation. The patient has been using Albuterol sulfate inhaler for wheezing, particularly at night. She has been taking guaifenesin for approximately nine days, but paused due to concerns about prolonged use.             ROS All review of systems negative except what is listed in the HPI      Objective:    BP 124/79   Pulse 77   Temp 98.1 F (36.7 C) (Oral)   Ht 5\' 3"  (1.6 m)   Wt 238 lb (108 kg)   SpO2 99%   BMI 42.16 kg/m    Physical Exam Vitals reviewed.  Constitutional:      General: She is not in acute distress.    Appearance: Normal appearance. She is obese. She is ill-appearing.  HENT:     Right Ear: Tympanic membrane normal.     Left Ear: Tympanic membrane normal.     Nose: Congestion and rhinorrhea present.  Eyes:     Conjunctiva/sclera: Conjunctivae normal.   Cardiovascular:     Rate and Rhythm: Normal rate and regular rhythm.     Pulses: Normal pulses.  Pulmonary:     Effort: Pulmonary effort is normal.     Breath sounds: Normal breath sounds. No wheezing, rhonchi or rales.  Musculoskeletal:     Cervical back: No tenderness.  Skin:    General: Skin is warm and dry.  Neurological:     Mental Status: She is alert and oriented to person, place, and time.  Psychiatric:        Mood and Affect: Mood normal.        Behavior: Behavior normal.        Thought Content: Thought content normal.        Judgment: Judgment normal.        No results found for any visits on 09/19/23.      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 History of pneumonia. Noted blood in sputum, but unclear if from chest or nose. No significant sinus pressure. Currently using Albuterol inhaler for wheezing. -Order chest x-ray  -Prescribe 7-10 days of Augmentin for possible secondary infection. -Advise to continue supportive measures including use of Albuterol inhaler, Mucinex, and Tessalon Perles as needed, especially at night. -Advise to continue hydration and rest.  Anticipated Yeast Infection History of yeast infection following antibiotic use. -Prescribe Diflucan, two tablets. Instruct to take first tablet a couple of days into antibiotic course and second tablet three days later if needed.        Meds ordered this encounter  Medications   amoxicillin-clavulanate (AUGMENTIN) 875-125 MG tablet    Sig: Take 1 tablet by mouth 2 (two) times daily for 10 days.     Dispense:  20 tablet    Refill:  0    Supervising Provider:   Danise Edge A [[id number]]   fluconazole (DIFLUCAN) 150 MG tablet    Sig: Take 1 tablet (150 mg total) by mouth once for 1 dose.    Dispense:  2 tablet    Refill:  0    Supervising Provider:   Danise Edge A [[id number]]    Return if symptoms worsen or fail to improve.  Clayborne Dana, NP

## 2023-09-19 NOTE — Telephone Encounter (Signed)
Copied from CRM [id number]. Topic: Clinical - Medical Advice >> Sep 19, 2023  9:23 AM Samuel Jester B wrote: Reason for CRM: Pt stated that she has this bad cough from currently having covid and is not sure what she should do. She feels like it hay be affecting her lungs, and would like to speak with someone regarding her concerned issues.

## 2023-10-10 ENCOUNTER — Ambulatory Visit (HOSPITAL_BASED_OUTPATIENT_CLINIC_OR_DEPARTMENT_OTHER)
Admission: RE | Admit: 2023-10-10 | Discharge: 2023-10-10 | Disposition: A | Discharge status: Home / Self Care | Payer: 59 | Source: Ambulatory Visit | Attending: Family Medicine | Admitting: Family Medicine

## 2023-10-10 ENCOUNTER — Ambulatory Visit: Payer: Self-pay | Admitting: Family Medicine

## 2023-10-10 ENCOUNTER — Ambulatory Visit: Payer: 59 | Admitting: Family Medicine

## 2023-10-10 ENCOUNTER — Encounter: Payer: Self-pay | Admitting: Family Medicine

## 2023-10-10 VITALS — BP 116/80 | HR 91 | Temp 98.0°F | Resp 16 | Ht 63.0 in | Wt 238.0 lb

## 2023-10-10 DIAGNOSIS — R0609 Other forms of dyspnea: Secondary | ICD-10-CM | POA: Diagnosis not present

## 2023-10-10 DIAGNOSIS — R0989 Other specified symptoms and signs involving the circulatory and respiratory systems: Secondary | ICD-10-CM | POA: Diagnosis not present

## 2023-10-10 MED ORDER — FLUTICASONE PROPIONATE HFA 110 MCG/ACT IN AERO: 2.0000 | INHALATION_SPRAY | Freq: Two times a day (BID) | RESPIRATORY_TRACT | 1 refills | Status: DC

## 2023-10-10 NOTE — Telephone Encounter (Signed)
 Copied from CRM [id number]. Topic: Clinical - Red Word Triage >> Oct 10, 2023 11:27 AM Cassidy Cassidy [name] wrote: Reason for CRM: Patient was seen after being diagnosed with COVID, states she is not feeling better. Patient is out of breath & tired, experiencing chest congestion, coughing. Main symptom is the cough, states her chest is feeling really tight.  Chief Complaint: SOB and chest tightness after coughing Symptoms: Coughing, SOB, chest tightness after coughing, wheezing Frequency: More than 10 days Pertinent Negatives: Patient denies relief Disposition: [] ED /[] Urgent Care (no appt availability in office) / [x] Appointment(In office/virtual)/ []  [name]/ [] Home Care/ [] Refused Recommended Disposition /[] [name]/ []  Follow-up with PCP Additional Notes: Patient called to report coughing spells that cause SOB and tightness in her chest. Patient stated she had COVID about 2 months ago and an URI 10 days ago and symptoms have been ongoing and changing. Patient stated that she has Cassidy [name] hard time catching her breath after Cassidy [name] coughing spell or whenever she is physically active. Patient stated that she has not been able to lay flat for the past 3 days. Patient also reported vomiting in the morning after Cassidy [name] coughing spell. Patient stated that it sounds like Cassidy [name] kitten meowing whenever she takes Cassidy [name] deep breath. Patient stated she is having to use her rescue inhaler 1-2 times Cassidy [name] day and this is very unusual to her. Patient currently takes Cassidy [name] Tessalon  Perles at night. Patient stated that she thought her symptoms were improving last week, but they worsened again and the frequent use of her rescue inhaler is concerning to her. Advised patient to be seen by Cassidy [name] provider within 4 hours. Scheduled patient in office for this afternoon. Advised patient to call 911 if breathing worsens before then. Patient complied.   Reason for Disposition  [1] MILD difficulty breathing (e.g., minimal/no SOB at rest, SOB with  walking, pulse <100) AND [2] NEW-onset or WORSE than normal  Answer Assessment - Initial Assessment Questions 1. RESPIRATORY STATUS: Describe your breathing? (e.g., wheezing, shortness of breath, unable to speak, severe coughing)      Patient has coughing spells and states she has Cassidy [name] hard time catching her breath afterwards and when she's physically moving around   2. ONSET: When did this breathing problem begin?      Statrted with COVID and then Cassidy [name] URI 10 days ago   3. PATTERN Does the difficult breathing come and go, or has it been constant since it started?      Patient states she thought is was getting better the middle of last week, but returned  4. SEVERITY: How bad is your breathing? (e.g., mild, moderate, severe)    - MILD: No SOB at rest, mild SOB with walking, speaks normally in sentences, can lie down, no retractions, pulse < 100.    - MODERATE: SOB at rest, SOB with minimal exertion and prefers to sit, cannot lie down flat, speaks in phrases, mild retractions, audible wheezing, pulse 100-120.    - SEVERE: Very SOB at rest, speaks in single words, struggling to breathe, sitting hunched forward, retractions, pulse > 120      Patient states she feels SOB after coughing spells or being physically actice, states she has not been able to lay down flat the past 3 days  5. RECURRENT SYMPTOM: Have you had difficulty breathing before? If Yes, ask: When was the last time? and What happened that time?     Patient states this is Cassidy [name] new onset  6.  CARDIAC HISTORY: Do you have any history of heart disease? (e.g., heart attack, angina, bypass surgery, angioplasty)      Yes, patient states she has Cassidy [name] history of SVT  7. LUNG HISTORY: Do you have any history of lung disease?  (e.g., pulmonary embolus, asthma, emphysema)     Patient uses Cassidy [name] rescue inhaler for bronchitis  8. CAUSE: What do you think is causing the breathing problem?      Recent illness  9. OTHER SYMPTOMS: Do you  have any other symptoms? (e.g., dizziness, runny nose, cough, chest pain, fever)     Wheezing- patient states her breathing sounds like Cassidy [name] kitten  10. O2 SATURATION MONITOR:  Do you use an oxygen saturation monitor (pulse oximeter) at home? If Yes, ask: What is your reading (oxygen level) today? What is your usual oxygen saturation reading? (e.g., 95%)       Denies  Protocols used: Breathing Difficulty-Cassidy [name]-AH

## 2023-10-10 NOTE — Progress Notes (Signed)
 Chief Complaint  Patient presents with   Cough    Discuss cough    Cassidy [name] Cassidy [name] here for URI complaints.  Duration: 1 mo Associated symptoms: rhinorrhea, shortness of breath, and sneezing, HA, lightheadedness Denies: sinus congestion, sinus pain, itchy watery eyes, ear pain, ear drainage, sore throat, wheezing, myalgia, and fevers Treatment to date: albuterol , Tessalon   Sick contacts: No  Past Medical History:  Diagnosis Date   Acquired hypothyroidism    endocrinologist-- Carmelita Clover NP ([facility] [place])   Antiphospholipid antibody syndrome [facility])    hematology/ oncology--- dr timmy;  dx age 54 work-up done due to still birth (umbilical cord w/ clot)   Endometrial polyp    GERD (gastroesophageal reflux disease)    Irregular menses    Migraines    Mixed hyperlipidemia    PAT (paroxysmal atrial tachycardia) (HCC)    cardiologist--- dr marla. tobb;  long hx takes flecainide  and propranolol ;   event monitor 11-04-2020 symptomatic occasional PAC SVT likely PAT;  echo 10-29-2020 ef 6-65%   Postcoital bleeding    PSVT (paroxysmal supraventricular tachycardia) (HCC)    Restless leg syndrome    Uterine leiomyoma    Vertigo    Wears glasses     Objective BP 116/80   Pulse 91   Temp 98 F (36.7 C) (Oral)   Resp 16   Ht 5' 3 (1.6 m)   Wt 238 lb (108 kg)   LMP 10/05/2023   SpO2 97%   BMI 42.16 kg/m  General: Awake, alert, appears stated age HEENT: AT, Cassidy [name], ears patent b/l and TM's neg, nares patent w clear discharge, pharynx pink and without exudates, MMM, no sinus TTP bilaterally Neck: No masses or asymmetry Heart: RRR Lungs: CTAB, no accessory muscle use Psych: Age appropriate judgment and insight, normal mood and affect  DOE (dyspnea on exertion) - Plan: CBC, Comprehensive metabolic panel, DG Chest 2 View  Seems to be stemming from her bout with COVID.  Check another chest x-ray as this serves as a continuation of her coughing and a recent worsening in the breathing.   Check above labs.  Trial ICS.  Will see what results are and proceed accordingly.  She has no symptoms of heart failure on exam nor has she gained a significant amount of weight.  Continue to push fluids, practice good hand hygiene, cover mouth when coughing. F/u prn. If starting to experience fevers, shaking, or worsening shortness of breath, seek immediate care. Pt voiced understanding and agreement to the plan.  Cassidy [name] [place], DO 10/10/23 4:57 PM

## 2023-10-10 NOTE — Patient Instructions (Signed)
 Continue to push fluids, practice good hand hygiene, and cover your mouth if you cough. ? ?If you start having fevers, shaking or shortness of breath, seek immediate care. ? ?OK to take Tylenol 1000 mg (2 extra strength tabs) or 975 mg (3 regular strength tabs) every 6 hours as needed. ? ?Let us know if you need anything. ?

## 2023-10-11 ENCOUNTER — Other Ambulatory Visit: Payer: Self-pay | Admitting: Family Medicine

## 2023-10-11 ENCOUNTER — Encounter: Payer: Self-pay | Admitting: Family Medicine

## 2023-10-11 LAB — COMPREHENSIVE METABOLIC PANEL
ALT: 27 U/L (ref 0–35)
AST: 21 U/L (ref 0–37)
Albumin: 4.3 g/dL (ref 3.5–5.2)
Alkaline Phosphatase: 62 U/L (ref 39–117)
BUN: 12 mg/dL (ref 6–23)
CO2: 23 meq/L (ref 19–32)
Calcium: 9.2 mg/dL (ref 8.4–10.5)
Chloride: 104 meq/L (ref 96–112)
Creatinine, Ser: 0.82 mg/dL (ref 0.40–1.20)
GFR: 87.12 mL/min (ref >60.00)
Glucose, Bld: 89 mg/dL (ref 70–99)
Potassium: 4.3 meq/L (ref 3.5–5.1)
Sodium: 137 meq/L (ref 135–145)
Total Bilirubin: 0.6 mg/dL (ref 0.2–1.2)
Total Protein: 7.2 g/dL (ref 6.0–8.3)

## 2023-10-11 LAB — CBC
HCT: 45 % (ref 36.0–46.0)
Hemoglobin: 14.9 g/dL (ref 12.0–15.0)
MCHC: 33.1 g/dL (ref 30.0–36.0)
MCV: 98.1 fL (ref 78.0–100.0)
Platelets: 322 10*3/uL (ref 150.0–400.0)
RBC: 4.59 Mil/uL (ref 3.87–5.11)
RDW: 14.3 % (ref 11.5–15.5)
WBC: 12.7 10*3/uL — ABNORMAL HIGH (ref 4.0–10.5)

## 2023-10-11 MED ORDER — PREDNISONE 20 MG PO TABS: 40.0000 mg | ORAL_TABLET | Freq: Every day | ORAL | 0 refills | Status: AC

## 2023-10-18 ENCOUNTER — Encounter: Payer: Self-pay | Admitting: Family Medicine

## 2023-10-18 ENCOUNTER — Ambulatory Visit: Payer: 59 | Admitting: Family Medicine

## 2023-10-18 VITALS — BP 114/79 | HR 92 | Ht 63.0 in | Wt 238.0 lb

## 2023-10-18 DIAGNOSIS — I10 Essential (primary) hypertension: Secondary | ICD-10-CM

## 2023-10-18 DIAGNOSIS — R1033 Periumbilical pain: Secondary | ICD-10-CM

## 2023-10-18 DIAGNOSIS — R052 Subacute cough: Secondary | ICD-10-CM

## 2023-10-18 DIAGNOSIS — E782 Mixed hyperlipidemia: Secondary | ICD-10-CM

## 2023-10-18 DIAGNOSIS — D72829 Elevated white blood cell count, unspecified: Secondary | ICD-10-CM

## 2023-10-18 LAB — CBC WITH DIFFERENTIAL/PLATELET
Basophils Absolute: 0.1 10*3/uL (ref 0.0–0.1)
Basophils Relative: 1 % (ref 0.0–3.0)
Eosinophils Absolute: 0.5 10*3/uL (ref 0.0–0.7)
Eosinophils Relative: 4.6 % (ref 0.0–5.0)
HCT: 45.4 % (ref 36.0–46.0)
Hemoglobin: 15.3 g/dL — ABNORMAL HIGH (ref 12.0–15.0)
Lymphocytes Relative: 23.5 % (ref 12.0–46.0)
Lymphs Abs: 2.5 10*3/uL (ref 0.7–4.0)
MCHC: 33.8 g/dL (ref 30.0–36.0)
MCV: 97 fL (ref 78.0–100.0)
Monocytes Absolute: 0.7 10*3/uL (ref 0.1–1.0)
Monocytes Relative: 6.8 % (ref 3.0–12.0)
Neutro Abs: 6.8 10*3/uL (ref 1.4–7.7)
Neutrophils Relative %: 64.1 % (ref 43.0–77.0)
Platelets: 277 10*3/uL (ref 150.0–400.0)
RBC: 4.68 Mil/uL (ref 3.87–5.11)
RDW: 14.3 % (ref 11.5–15.5)
WBC: 10.7 10*3/uL — ABNORMAL HIGH (ref 4.0–10.5)

## 2023-10-18 MED ORDER — WEGOVY 0.25 MG/0.5ML ~~LOC~~ SOAJ: 0.2500 mg | SUBCUTANEOUS | 1 refills | Status: DC

## 2023-10-18 NOTE — Progress Notes (Signed)
Acute Office Visit  Subjective:     Patient ID: Cassidy [name], female    DOB: 04-13-1981, 43 y.o.   MRN: [id number]  Chief Complaint  Patient presents with   Medical Management of Chronic Issues    HPI Patient is in today for dyspnea follow-up.    Discussed the use of AI scribe software for clinical note transcription with the patient, who gave verbal consent to proceed.  History of Present Illness   The patient, previously diagnosed with COVID-19, presented with a recurrence of respiratory symptoms approximately a month after initial improvement. They reported increased use of their Albuterol inhaler, up to three times daily, due to difficulty breathing. The patient described dyspnea on exertion and a return of symptoms despite having completed a course of Augmentin. They attempted to manage these symptoms for about a week and a half before seeking medical attention from my colleague on 10/10/23 - The patient was initially hesitant to start Prednisone due to a recent use. They considered using Fluticasone inhaler, but it was on backorder and prohibitively expensive. Eventually, they decided to start Prednisone. However, before they could begin the medication, they experienced a severe coughing fit followed by vomiting of clear, thick mucus. Following this episode, their breathing improved significantly, and they have not needed to use their inhaler or Tessalon Perles since. She never started the prednisone.   The patient also reported a recent issue with their belly button, which began to bleed and discharge pus after they removed an ingrown hair. They have been managing this with topical antibiotic ointment and alcohol to clean and dry the area. She has not noticed any further drainage or irritation.   The patient expressed a desire to lose weight and is considering using a weight loss injection. They are also planning to participate in a wellness program offered by their employer - [employer].  They have a history of smoking, which they have reduced but not completely quit. They have a history of thyroiditis and are currently on medication for hypothyroidism.             ROS All review of systems negative except what is listed in the HPI      Objective:    BP 114/79   Pulse 92   Ht 5\' 3"  (1.6 m)   Wt 238 lb (108 kg)   LMP 10/05/2023   SpO2 100%   BMI 42.16 kg/m    Physical Exam Vitals reviewed.  Constitutional:      General: She is not in acute distress.    Appearance: Normal appearance. She is obese. She is not ill-appearing.  Cardiovascular:     Rate and Rhythm: Normal rate and regular rhythm.  Pulmonary:     Effort: Pulmonary effort is normal.     Breath sounds: Normal breath sounds. No wheezing, rhonchi or rales.  Abdominal:     Palpations: Abdomen is soft. There is no mass.     Tenderness: There is no abdominal tenderness. There is no guarding.  Skin:    General: Skin is warm and dry.     Findings: No erythema or rash.  Neurological:     Mental Status: She is alert and oriented to person, place, and time.  Psychiatric:        Mood and Affect: Mood normal.        Behavior: Behavior normal.        Thought Content: Thought content normal.        Judgment:  Judgment normal.     No results found for any visits on 10/18/23.      Assessment & Plan:   Problem List Items Addressed This Visit       Active Problems   Morbid obesity (HCC)   Relevant Medications   Semaglutide-Weight Management (WEGOVY) 0.25 MG/0.5ML SOAJ   Mixed hyperlipidemia   Relevant Medications   Semaglutide-Weight Management (WEGOVY) 0.25 MG/0.5ML SOAJ   Primary hypertension   Relevant Medications   Semaglutide-Weight Management (WEGOVY) 0.25 MG/0.5ML SOAJ   Other Visit Diagnoses       Leukocytosis, unspecified type    -  Primary   Relevant Orders   CBC with Differential/Platelet     Umbilical pain         Subacute cough           Cough, Dyspnea  Follow-up Recent history of COVID-19 with persistent cough and mucus production. Recent improvement in symptoms after a severe coughing episode with mucus expectoration. No current use of Albuterol or Tessalon Perles. Lungs clear on examination. -Continue current management. -Encourage smoking cessation. -Advise hydration and use of Mucinex if mucus production increases. -Repeat complete blood count to assess current white blood cell count.  Weight management Patient expresses interest in weight loss and is considering the use of Wegovy injections. Patient plans to participate in a weight loss program. -Submit prescription for [facility] to pharmacy and await insurance approval. Discussed medication including risks, benefits and contraindication.  -Encourage participation in weight loss program for lifestyle changes.  Umbilical infection Recent onset of pain and discharge from the umbilicus, possibly related to an ingrown hair. Patient has been managing with topical antibiotic ointment and alcohol. No symptoms today. -Advise continuation of current management. -Advise patient to seek medical attention if symptoms worsen or if systemic symptoms such as fever develop.        Meds ordered this encounter  Medications   Semaglutide-Weight Management (WEGOVY) 0.25 MG/0.5ML SOAJ    Sig: Inject 0.25 mg into the skin once a week.    Dispense:  2 mL    Refill:  1    Supervising Provider:   Danise Edge A [[id number]]    Return if symptoms worsen or fail to improve, for 38-month weight check if [facility] approved. Clayborne Dana, NP

## 2023-10-19 ENCOUNTER — Encounter: Payer: Self-pay | Admitting: Family Medicine

## 2023-10-19 MED ORDER — ZEPBOUND 2.5 MG/0.5ML ~~LOC~~ SOAJ: 2.5000 mg | SUBCUTANEOUS | 1 refills | Status: DC

## 2023-10-20 ENCOUNTER — Telehealth: Payer: Self-pay

## 2023-10-20 NOTE — Telephone Encounter (Signed)
PA initiated via Covermymeds; KEY: [id number]. Awaiting determination.

## 2023-10-20 NOTE — Telephone Encounter (Signed)
PA approved. Effective 10/20/2023 to 06/16/2024

## 2023-11-07 ENCOUNTER — Telehealth: Payer: Self-pay | Admitting: Neurology

## 2023-11-07 MED ORDER — TIRZEPATIDE-WEIGHT MANAGEMENT 5 MG/0.5ML ~~LOC~~ SOAJ: 5.0000 mg | SUBCUTANEOUS | 1 refills | Status: DC

## 2023-11-07 NOTE — Telephone Encounter (Signed)
 Currently on Zepbound  2.5 mg. Increase?  Copied from CRM [id number]. Topic: Clinical - Medication Question >> Nov 07, 2023  3:50 PM Deaijah H wrote: Reason for CRM: *no question* Patient called stating she was advised to call once she has 1 injection left for zepbound  and would like to know if she will be getting the same dosage or not & would like to know if she has to do anything on her end as far as with her insurance / please [phone]

## 2023-11-08 NOTE — Telephone Encounter (Signed)
Patient made aware new dosage sent. No ongoing symptoms with first dosage. Had some constipation and fatigue.

## 2023-12-13 ENCOUNTER — Other Ambulatory Visit: Payer: Self-pay | Admitting: Family Medicine

## 2023-12-13 NOTE — Telephone Encounter (Signed)
 Copied from CRM [id number]. Topic: Clinical - Medication Refill >> Dec 13, 2023 12:12 PM Melissa C wrote: Most Recent Primary Care Visit:  Provider: Clayborne Dana  Department: [facility]  Visit Type: OFFICE VISIT  Date: 10/18/2023  Medication: tirzepatide (ZEPBOUND) 5 MG/0.5ML Pen  Has the patient contacted their pharmacy? Yes (Agent: If no, request that the patient contact the pharmacy for the refill. If patient does not wish to contact the pharmacy document the reason why and proceed with request.) (Agent: If yes, patient contacted pharmacy today and they advised patient contact doctor because patient is on a step up program with Zepbound and this prescription is the next step up. Patient thinks it is 7.5mg  but she is not sure- whatever doctor advises)  Is this the correct pharmacy for this prescription? Yes If no, delete pharmacy and type the correct one.  This is the patient's preferred pharmacy:  [facility] [id number] [facility], [place] - [address] [place] [address] Phone: [phone] Fax: [phone]   Has the prescription been filled recently? No  Is the patient out of the medication? Yes-please see notes above regarding what pharmacy advised  Has the patient been seen for an appointment in the last year OR does the patient have an upcoming appointment? Yes  Can we respond through MyChart? Yes  Agent: Please be advised that Rx refills may take up to 3 business days. We ask that you follow-up with your pharmacy.

## 2023-12-14 MED ORDER — TIRZEPATIDE-WEIGHT MANAGEMENT 5 MG/0.5ML ~~LOC~~ SOAJ: 5.0000 mg | SUBCUTANEOUS | 1 refills | Status: DC

## 2023-12-16 ENCOUNTER — Telehealth: Payer: Self-pay | Admitting: Emergency Medicine

## 2023-12-16 NOTE — Telephone Encounter (Signed)
 Do you want to increase her dosage of Zepbound?

## 2023-12-16 NOTE — Telephone Encounter (Signed)
 Copied from CRM [id number]. Topic: Clinical - Medication Question >> Dec 16, 2023 11:44 AM Fredrich Romans wrote: Reason for CRM: Patient would ike to know if she is still suppose to be on the 5.0 mg of zepbound.She stated that she thought that the dosage was suppose to increase after a month,and she has already did the 5.0 for  a month.She would like clarification on dosage?

## 2023-12-20 ENCOUNTER — Telehealth: Payer: Self-pay | Admitting: Family Medicine

## 2023-12-20 MED ORDER — TIRZEPATIDE-WEIGHT MANAGEMENT 7.5 MG/0.5ML ~~LOC~~ SOLN
7.5000 mg | SUBCUTANEOUS | 0 refills | Status: DC
Start: 2023-12-20 — End: 2023-12-20

## 2023-12-20 MED ORDER — TIRZEPATIDE-WEIGHT MANAGEMENT 7.5 MG/0.5ML ~~LOC~~ SOAJ: 7.5000 mg | SUBCUTANEOUS | 0 refills | Status: DC

## 2023-12-20 NOTE — Addendum Note (Signed)
 Addended by: Hyman Hopes B on: 12/20/2023 12:34 PM   Modules accepted: Orders

## 2023-12-20 NOTE — Telephone Encounter (Signed)
 Copied from CRM [id number]. Topic: Clinical - Prescription Issue >> Dec 20, 2023 12:50 PM Florestine Avers wrote: Reason for CRM: Pharmacist calling in needing clarification on how tirzepatide 7.5 MG/0.5ML injection vial prescription was ordered. Please contact Karin Golden [phone].

## 2023-12-20 NOTE — Addendum Note (Signed)
 Addended by: Hyman Hopes B on: 12/20/2023 01:47 PM   Modules accepted: Orders

## 2023-12-20 NOTE — Telephone Encounter (Signed)
 Called patient. Instructions given per provider's note. She said she feels this will help her get through some of the craving she is continuing to have.

## 2024-01-03 ENCOUNTER — Ambulatory Visit: Payer: 59 | Admitting: Family Medicine

## 2024-01-03 ENCOUNTER — Encounter: Payer: Self-pay | Admitting: Family Medicine

## 2024-01-03 DIAGNOSIS — E063 Autoimmune thyroiditis: Secondary | ICD-10-CM

## 2024-01-03 DIAGNOSIS — E782 Mixed hyperlipidemia: Secondary | ICD-10-CM

## 2024-01-03 LAB — LIPID PANEL
Cholesterol: 225 mg/dL — ABNORMAL HIGH (ref 0–200)
HDL: 44 mg/dL (ref >39.00)
LDL Cholesterol: 154 mg/dL — ABNORMAL HIGH (ref 0–99)
NonHDL: 180.63
Total CHOL/HDL Ratio: 5
Triglycerides: 131 mg/dL (ref 0.0–149.0)
VLDL: 26.2 mg/dL (ref 0.0–40.0)

## 2024-01-03 LAB — COMPREHENSIVE METABOLIC PANEL WITH GFR
ALT: 22 U/L (ref 0–35)
AST: 17 U/L (ref 0–37)
Albumin: 4.2 g/dL (ref 3.5–5.2)
Alkaline Phosphatase: 52 U/L (ref 39–117)
BUN: 14 mg/dL (ref 6–23)
CO2: 26 meq/L (ref 19–32)
Calcium: 9.4 mg/dL (ref 8.4–10.5)
Chloride: 104 meq/L (ref 96–112)
Creatinine, Ser: 0.78 mg/dL (ref 0.40–1.20)
GFR: 92.36 mL/min (ref >60.00)
Glucose, Bld: 55 mg/dL — ABNORMAL LOW (ref 70–99)
Potassium: 4.5 meq/L (ref 3.5–5.1)
Sodium: 138 meq/L (ref 135–145)
Total Bilirubin: 0.8 mg/dL (ref 0.2–1.2)
Total Protein: 7.1 g/dL (ref 6.0–8.3)

## 2024-01-03 LAB — TSH: TSH: 1.75 u[IU]/mL (ref 0.35–5.50)

## 2024-01-03 NOTE — Assessment & Plan Note (Signed)
Medication management: lifestyle measures only Lifestyle factors for lowering cholesterol include: Diet therapy - heart-healthy diet rich in fruits, veggies, fiber-rich whole grains, lean meats, chicken, fish (at least twice a week), fat-free or 1% dairy products; foods low in saturated/trans fats, cholesterol, sodium, and sugar. Mediterranean diet has shown to be very heart healthy. Regular exercise - recommend at least 30 minutes a day, 5 times per week Weight management

## 2024-01-03 NOTE — Assessment & Plan Note (Signed)
 Continue Synthroid at current dose - TSH recheck today

## 2024-01-03 NOTE — Assessment & Plan Note (Signed)
 Weight loss achieved with Zepbound and dietary changes. Plateau resolved with increased dosage.  - Continue Zepbound at 7.5 mg. - Monitor weight loss and adjust dosage if plateau occurs. - Encourage healthy eating and exercise.

## 2024-01-03 NOTE — Progress Notes (Signed)
 Established Patient Office Visit  Subjective   Patient ID: Cassidy [name], female    DOB: 01/16/1981  Age: 43 y.o. MRN: [id number]  Chief Complaint  Patient presents with   Medical Management of Chronic Issues    HPI    Discussed the use of AI scribe software for clinical note transcription with the patient, who gave verbal consent to proceed.  History of Present Illness The patient presents with weight management concerns and mental health improvements on Zepbound.  She is focused on weight management and is satisfied with her slow but steady weight loss. She attributes her progress to dietary changes, such as avoiding fried foods and reducing carbohydrate intake, and uses Noom to help manage her diet. Her wife is also losing weight due to these dietary changes. She has switched to oat milk in her protein shakes, which she feels has helped her joints.  She experienced a plateau in weight loss while on a 5 mg dose of Zepbound, losing only two pounds over four weeks. After increasing to a 7.5 mg dose, she lost two to three pounds in a week and a half. She monitors her weight daily using Noom and has noticed fluctuations, which she attributes to fluid retention and dietary intake. She is concerned about insurance coverage if she loses too much weight.  She is currently on Zepbound and has noticed significant mental health improvements, including the cessation of nightmares, reduced anxiety, and improved sleep quality. She no longer requires melatonin for sleep and feels less irritable, attributing these changes to the medication. She wants to continue the medication due to these benefits.  Her exercise routine is described as 'baby steps,' currently walking 10 to 15 minutes daily. She finds walking outside challenging due to sun sensitivity, which causes her to break out in sun poisoning, so she uses a treadmill at home. She finds treadmill walking boring but is gradually building  stamina.   Her cholesterol was high in November, and she is curious about her current levels.  She consumes a small Red Bull daily as her morning drink instead of coffee and logs her intake on Noom. She emphasizes that she does not consume much sugar and is mindful of her caffeine intake.     Wt Readings from Last 3 Encounters:  01/03/24 232 lb (105.2 kg)  10/18/23 238 lb (108 kg)  10/10/23 238 lb (108 kg)         ROS All review of systems negative except what is listed in the HPI    Objective:     BP 103/71   Pulse 90   Ht 5\' 3"  (1.6 m)   Wt 232 lb (105.2 kg)   SpO2 99%   BMI 41.10 kg/m     Physical Exam Vitals reviewed.  Constitutional:      General: She is not in acute distress.    Appearance: Normal appearance. She is obese. She is not ill-appearing.  Cardiovascular:     Rate and Rhythm: Normal rate and regular rhythm.  Pulmonary:     Effort: Pulmonary effort is normal.     Breath sounds: Normal breath sounds. No wheezing, rhonchi or rales.  Abdominal:     Palpations: Abdomen is soft. There is no mass.     Tenderness: There is no abdominal tenderness. There is no guarding.  Skin:    General: Skin is warm and dry.     Findings: No erythema or rash.  Neurological:     Mental Status:  She is alert and oriented to person, place, and time.  Psychiatric:        Mood and Affect: Mood normal.        Behavior: Behavior normal.        Thought Content: Thought content normal.        Judgment: Judgment normal.        No results found for any visits on 01/03/24.    The 10-year ASCVD risk score (Arnett DK, et al., 2019) is: 3.8%    Assessment & Plan:   Problem List Items Addressed This Visit       Active Problems   Hypothyroidism   Continue Synthroid at current dose - TSH recheck today        Relevant Orders   TSH   Morbid obesity (HCC) - Primary   Weight loss achieved with Zepbound and dietary changes. Plateau resolved with increased  dosage.  - Continue Zepbound at 7.5 mg. - Monitor weight loss and adjust dosage if plateau occurs. - Encourage healthy eating and exercise.      Relevant Orders   TSH   Comprehensive metabolic panel with GFR   Lipid panel   Mixed hyperlipidemia   Medication management: lifestyle measures only Lifestyle factors for lowering cholesterol include: Diet therapy - heart-healthy diet rich in fruits, veggies, fiber-rich whole grains, lean meats, chicken, fish (at least twice a week), fat-free or 1% dairy products; foods low in saturated/trans fats, cholesterol, sodium, and sugar. Mediterranean diet has shown to be very heart healthy. Regular exercise - recommend at least 30 minutes a day, 5 times per week Weight management        Relevant Orders   TSH   Lipid panel       Return in about 3 months (around 04/03/2024) for routine follow-up/weight check.    Clayborne Dana, NP

## 2024-01-04 ENCOUNTER — Encounter: Payer: Self-pay | Admitting: Family Medicine

## 2024-01-14 ENCOUNTER — Other Ambulatory Visit: Payer: Self-pay | Admitting: Family Medicine

## 2024-01-16 ENCOUNTER — Other Ambulatory Visit: Payer: Self-pay | Admitting: Nurse Practitioner

## 2024-02-04 ENCOUNTER — Other Ambulatory Visit: Payer: Self-pay | Admitting: Cardiology

## 2024-02-06 ENCOUNTER — Ambulatory Visit: Payer: 59 | Admitting: Family Medicine

## 2024-02-09 NOTE — Telephone Encounter (Signed)
 Received note from insurance stating Zepbound  no longer covered without PA. PA good til September. Will address when PA runs out.

## 2024-03-07 ENCOUNTER — Ambulatory Visit: Admitting: Family Medicine

## 2024-03-07 ENCOUNTER — Encounter: Payer: Self-pay | Admitting: Family Medicine

## 2024-03-07 ENCOUNTER — Ambulatory Visit: Payer: Self-pay

## 2024-03-07 VITALS — BP 135/85 | HR 84 | Ht 63.0 in | Wt 226.0 lb

## 2024-03-07 DIAGNOSIS — R5383 Other fatigue: Secondary | ICD-10-CM

## 2024-03-07 DIAGNOSIS — R002 Palpitations: Secondary | ICD-10-CM | POA: Diagnosis not present

## 2024-03-07 NOTE — Progress Notes (Signed)
 Acute Office Visit  Subjective:     Patient ID: Cassidy [name], female    DOB: 02-Apr-1981, 43 y.o.   MRN: [id number]  Chief Complaint  Patient presents with   Medical Management of Chronic Issues    HPI Patient is in today for fatigue, palpitations.    Discussed the use of AI scribe software for clinical note transcription with the patient, who gave verbal consent to proceed.  History of Present Illness Cassidy [name] is a 43 year old female with Hashimoto's thyroiditis and supraventricular tachycardia who presents with fatigue and palpitations.  Over the past three weeks, she has been under significant emotional and physical stress, which she believes may have exacerbated her Hashimoto's thyroiditis. Her symptoms of fatigue and palpitations worsened yesterday, with the palpitations being particularly severe. Her current medication, flecainide , is not alleviating these symptoms.  She returned to work yesterday but felt exhausted after only an hour, indicating a significant decrease in stamina. She also feels depressed, attributing her symptoms more to her thyroid  condition than to depression.  A series of stressful events, including Mother's Day, her son's 22nd birthday, and caring for a senior dog who has been sick and recently became incontinent, have contributed to her emotional stress.  She describes the palpitations as 'really bad heart flutters' that worsen when she thinks about her stressors. She recalls similar symptoms when first diagnosed with supraventricular tachycardia, including a 'funny feeling in my throat' and persistent coughing.  Her work environment is also a source of stress, with her supervisor criticizing her performance. She had to leave work early today for this appointment, which added to her stress.       ROS All review of systems negative except what is listed in the HPI      Objective:    BP 135/85   Pulse 84   Ht 5' 3 (1.6 m)   Wt 226 lb  (102.5 kg)   SpO2 99%   BMI 40.03 kg/m    Physical Exam Vitals reviewed.  Constitutional:      General: She is not in acute distress.    Appearance: Normal appearance. She is obese.  HENT:     Head: Normocephalic and atraumatic.  Cardiovascular:     Rate and Rhythm: Normal rate and regular rhythm.     Comments: Occasional ectopic beat Pulmonary:     Effort: Pulmonary effort is normal.     Breath sounds: Normal breath sounds. No wheezing, rhonchi or rales.  Abdominal:     Palpations: Abdomen is soft. There is no mass.     Tenderness: There is no abdominal tenderness. There is no guarding.  Neurological:     Mental Status: She is alert and oriented to person, place, and time.  Psychiatric:        Mood and Affect: Mood normal.        Behavior: Behavior normal.        Thought Content: Thought content normal.        Judgment: Judgment normal.     No results found for any visits on 03/07/24.      Assessment & Plan:   Problem List Items Addressed This Visit   None Visit Diagnoses       Fatigue, unspecified type    -  Primary   Relevant Orders   B12 and Folate Panel   CBC with Differential/Platelet   Comprehensive metabolic panel with GFR   TSH   T4, free   EKG  12-Lead (Completed)     Palpitations       Relevant Orders   B12 and Folate Panel   CBC with Differential/Platelet   Comprehensive metabolic panel with GFR   TSH   T4, free   EKG 12-Lead (Completed)      EKG = SR 85 bpm, no ischemic changes Follow-up with cardiology regarding worsening symptoms Labs today She prefers to rule out all physical causes for her symptoms before changing depression management, counseling, etc.  No SI/HI.  Patient aware of signs/symptoms requiring further/urgent evaluation.          No orders of the defined types were placed in this encounter.   Return if symptoms worsen or fail to improve.  Cassidy Hock, NP

## 2024-03-07 NOTE — Telephone Encounter (Signed)
 FYI Only or Action Required?: FYI only for provider  Patient was last seen in primary care on 01/03/2024 by Everlina Hock, NP. Called Nurse Triage reporting Palpitations. Symptoms began 6 days ago. Interventions attempted: Prescription medications: usual medications and Rest, hydration, or home remedies. Symptoms are: stable per patient.  Triage Disposition: See HCP Within 4 Hours (Or PCP Triage)  Patient/caregiver understands and will follow disposition?: Yes       Appointment today 03/07/2024 at 3:20 PM with Minna Amass, patient's PCP.      Copied from CRM [id number]. Topic: Clinical - Red Word Triage >> Mar 07, 2024  1:01 PM Chuck Crater wrote: [facility] that prompted transfer to Nurse Triage: Patient is very fatigued, light headed with heart palpitations. She think  she is having issues with her thyroid . Reason for Disposition  History of heart disease (i.e., heart attack, bypass surgery, angina, angioplasty, CHF)  (Exception: Brief heartbeat symptoms that went away and now feels well.)  Answer Assessment - Initial Assessment Questions 1. DESCRIPTION: Describe how you are feeling.     stressed 2. SEVERITY: How bad is it?  Can you stand and walk?   - MILD (0-3): Feels weak or tired, but does not interfere with work, school or normal activities.   - MODERATE (4-7): Able to stand and walk; weakness interferes with work, school, or normal activities.   - SEVERE (8-10): Unable to stand or walk; unable to do usual activities.     Mild 3. ONSET: When did these symptoms begin? (e.g., hours, days, weeks, months)     Tired for the past 6 days 4. CAUSE: What do you think is causing the weakness or fatigue? (e.g., not drinking enough fluids, medical problem, trouble sleeping)     Unsure---patient thinks stress is causing her hashimotos to flare up 5. NEW MEDICINES:  Have you started on any new medicines recently? (e.g., opioid pain medicines, benzodiazepines, muscle relaxants,  antidepressants, antihistamines, neuroleptics, beta blockers)     No 6. OTHER SYMPTOMS: Do you have any other symptoms? (e.g., chest pain, fever, cough, SOB, vomiting, diarrhea, bleeding, other areas of pain)     Diarrhea since yesterday, palpitations, stressed, shortness of breath just when she laid down last night 7. PREGNANCY: Is there any chance you are pregnant? When was your last menstrual period?     No  Answer Assessment - Initial Assessment Questions 1. DESCRIPTION: Please describe your heart rate or heartbeat that you are having (e.g., fast/slow, regular/irregular, skipped or extra beats, palpitations)     Palpitations along with excessive fatigue 2. ONSET: When did it start? (Minutes, hours or days)      About 6 days ago 3. DURATION: How long does it last (e.g., seconds, minutes, hours)     ----- 4. PATTERN Does it come and go, or has it been constant since it started?  Does it get worse with exertion?   Are you feeling it now?     Comes and goes 5. TAP: Using your hand, can you tap out what you are feeling on a chair or table in front of you, so that I can hear? (Note: not all patients can do this)       ----- 6. HEART RATE: Can you tell me your heart rate? How many beats in 15 seconds?  (Note: not all patients can do this)       90 bpm at this time per watch 7. RECURRENT SYMPTOM: Have you ever had this before? If Yes,  ask: When was the last time? and What happened that time?      Yes---every year at this time per patient 8. CAUSE: What do you think is causing the palpitations?     Stress/history of same/possibly hashimotos 9. CARDIAC HISTORY: Do you have any history of heart disease? (e.g., heart attack, angina, bypass surgery, angioplasty, arrhythmia)      Cardiac history 10. OTHER SYMPTOMS: Do you have any other symptoms? (e.g., dizziness, chest pain, sweating, difficulty breathing)       Stable at this time per patient 11. PREGNANCY:  Is there any chance you are pregnant? When was your last menstrual period?       No    Patient is advised that if anything worsens to go immediately to the Emergency Room. Patient verbalized understanding.  Protocols used: Weakness (Generalized) and Fatigue-A-AH, Heart Rate and Heartbeat Questions-A-AH

## 2024-03-08 ENCOUNTER — Ambulatory Visit: Payer: Self-pay | Admitting: Family Medicine

## 2024-03-08 LAB — CBC WITH DIFFERENTIAL/PLATELET
Absolute Lymphocytes: 2381 {cells}/uL (ref 850–3900)
Absolute Monocytes: 736 {cells}/uL (ref 200–950)
Basophils Absolute: 92 {cells}/uL (ref 0–200)
Basophils Relative: 0.8 %
Eosinophils Absolute: 426 {cells}/uL (ref 15–500)
Eosinophils Relative: 3.7 %
HCT: 45.3 % — ABNORMAL HIGH (ref 35.0–45.0)
Hemoglobin: 15 g/dL (ref 11.7–15.5)
MCH: 31.6 pg (ref 27.0–33.0)
MCHC: 33.1 g/dL (ref 32.0–36.0)
MCV: 95.6 fL (ref 80.0–100.0)
MPV: 11.3 fL (ref 7.5–12.5)
Monocytes Relative: 6.4 %
Neutro Abs: 7866 {cells}/uL — ABNORMAL HIGH (ref 1500–7800)
Neutrophils Relative %: 68.4 %
Platelets: 274 10*3/uL (ref 140–400)
RBC: 4.74 10*6/uL (ref 3.80–5.10)
RDW: 13.7 % (ref 11.0–15.0)
Total Lymphocyte: 20.7 %
WBC: 11.5 10*3/uL — ABNORMAL HIGH (ref 3.8–10.8)

## 2024-03-08 LAB — B12 AND FOLATE PANEL
Folate: 2.6 ng/mL — ABNORMAL LOW
Vitamin B-12: 484 pg/mL (ref 200–1100)

## 2024-03-08 LAB — COMPREHENSIVE METABOLIC PANEL WITH GFR
AG Ratio: 1.4 (calc) (ref 1.0–2.5)
ALT: 29 U/L (ref 6–29)
AST: 22 U/L (ref 10–30)
Albumin: 4 g/dL (ref 3.6–5.1)
Alkaline phosphatase (APISO): 55 U/L (ref 31–125)
BUN: 12 mg/dL (ref 7–25)
CO2: 22 mmol/L (ref 20–32)
Calcium: 9.4 mg/dL (ref 8.6–10.2)
Chloride: 105 mmol/L (ref 98–110)
Creat: 0.79 mg/dL (ref 0.50–0.99)
Globulin: 2.9 g/dL (ref 1.9–3.7)
Glucose, Bld: 86 mg/dL (ref 65–99)
Potassium: 4.2 mmol/L (ref 3.5–5.3)
Sodium: 136 mmol/L (ref 135–146)
Total Bilirubin: 0.7 mg/dL (ref 0.2–1.2)
Total Protein: 6.9 g/dL (ref 6.1–8.1)
eGFR: 95 mL/min/{1.73_m2} (ref >=60)

## 2024-03-08 LAB — T4, FREE: Free T4: 1.9 ng/dL — ABNORMAL HIGH (ref 0.8–1.8)

## 2024-03-08 LAB — TSH: TSH: 1.56 m[IU]/L

## 2024-03-13 ENCOUNTER — Ambulatory Visit: Admitting: Family Medicine

## 2024-03-13 ENCOUNTER — Other Ambulatory Visit (HOSPITAL_COMMUNITY)
Admission: RE | Admit: 2024-03-13 | Discharge: 2024-03-13 | Disposition: A | Discharge status: Home / Self Care | Source: Ambulatory Visit | Attending: Family Medicine | Admitting: Family Medicine

## 2024-03-13 ENCOUNTER — Encounter: Payer: Self-pay | Admitting: Family Medicine

## 2024-03-13 VITALS — BP 111/79 | HR 91 | Temp 98.2°F | Ht 63.0 in | Wt 224.6 lb

## 2024-03-13 DIAGNOSIS — F419 Anxiety disorder, unspecified: Secondary | ICD-10-CM | POA: Diagnosis not present

## 2024-03-13 DIAGNOSIS — N898 Other specified noninflammatory disorders of vagina: Secondary | ICD-10-CM | POA: Diagnosis present

## 2024-03-13 DIAGNOSIS — R102 Pelvic and perineal pain: Secondary | ICD-10-CM | POA: Insufficient documentation

## 2024-03-13 DIAGNOSIS — F32A Depression, unspecified: Secondary | ICD-10-CM | POA: Diagnosis not present

## 2024-03-13 LAB — POC URINALSYSI DIPSTICK (AUTOMATED)
Bilirubin, UA: NEGATIVE
Glucose, UA: NEGATIVE
Ketones, UA: NEGATIVE
Leukocytes, UA: NEGATIVE
Nitrite, UA: NEGATIVE
Protein, UA: NEGATIVE
Spec Grav, UA: 1.01 (ref 1.010–1.025)
Urobilinogen, UA: 0.2 U/dL
pH, UA: 5 (ref 5.0–8.0)

## 2024-03-13 NOTE — Progress Notes (Signed)
 Acute Office Visit  Subjective:     Patient ID: Cassidy [name], female    DOB: 15-May-1981, 43 y.o.   MRN: [id number]  Chief Complaint  Patient presents with   vaginal psin    Reports vaginal pressure / pain x 2 days. Urinating relieves symptoms briefly.    HPI Patient is in today for vaginal pain.   Discussed the use of AI scribe software for clinical note transcription with the patient, who gave verbal consent to proceed.  History of Present Illness Cassidy [name] is a 43 year old female who presents with severe pelvic pressure and discomfort.  She has been experiencing severe pelvic pressure since yesterday, which began while visiting her mother in the hospital. The pressure is excruciating, particularly intense this morning, and located in the perineal area. Temporary relief is achieved through urination and bowel movements, but the pressure returns shortly after.  She notes frequent urination with small amounts of urine, without visible blood in the urine or stool. A burning sensation accompanies the intense pressure. She also mentions a recent menstrual period with some discharge persisting post-period, which she often experiences. She describes the pressure as feeling like her urethra is stretched and notes discomfort when sitting. No recent sexual intercourse. She has a history of a D&C hysteroscopy procedure two years ago, after which her periods have changed.  She has experienced diarrhea for the past two days, with soft to liquid stools, and denies constipation. She mentions a tendency to develop hemorrhoids but does not feel any currently. She also reports a rash likely due to frequent wiping.  She is currently dealing with significant stress due to her mother's hospitalization and work-related issues.         ROS All review of systems negative except what is listed in the HPI      Objective:    BP 111/79 (BP Location: Left Arm, Patient Position: Sitting, Cuff  Size: Large)   Pulse 91   Temp 98.2 F (36.8 C) (Oral)   Ht 5' 3 (1.6 m)   Wt 224 lb 9.6 oz (101.9 kg)   SpO2 99%   BMI 39.79 kg/m    Physical Exam Vitals reviewed. Exam conducted with a chaperone present.  Constitutional:      Appearance: Normal appearance.  Genitourinary:    General: Normal vulva.     Exam position: Lithotomy position.     Labia:        Right: No rash or tenderness.        Left: No rash or tenderness.      Vagina: Tenderness present.     Comments: Difficult exam due to discomfort; swelling/bulging to anterior vaginal wall; grayish discharge noted  Neurological:     Mental Status: She is alert and oriented to person, place, and time.   Psychiatric:        Mood and Affect: Mood normal.        Behavior: Behavior normal.        Thought Content: Thought content normal.        Judgment: Judgment normal.         Results for orders placed or performed in visit on 03/13/24  POCT Urinalysis Dipstick (Automated)  Result Value Ref Range   Color, UA yellow    Clarity, UA clear    Glucose, UA Negative Negative   Bilirubin, UA Negative    Ketones, UA Negative    Spec Grav, UA 1.010 1.010 - 1.025  Blood, UA large    pH, UA 5.0 5.0 - 8.0   Protein, UA Negative Negative   Urobilinogen, UA 0.2 0.2 or 1.0 E.U./dL   Nitrite, UA Negative    Leukocytes, UA Negative Negative        Assessment & Plan:   Problem List Items Addressed This Visit   None Visit Diagnoses       Pelvic pressure in female    -  Primary   Relevant Orders   POCT Urinalysis Dipstick (Automated) (Completed)   Ambulatory referral to Obstetrics / Gynecology   Cervicovaginal ancillary only     Vaginal pain       Relevant Orders   Ambulatory referral to Obstetrics / Gynecology   Cervicovaginal ancillary only     Vaginal discharge       Relevant Orders   Ambulatory referral to Obstetrics / Gynecology   Cervicovaginal ancillary only     Anxiety and depression       Relevant  Orders   Ambulatory referral to Behavioral Health       Assessment and Plan Assessment & Plan Pelvic Pressure Severe pelvic pressure with burning sensation, worsens when sitting. Differential includes pelvic organ prolapse, bladder issues, or BV. Examination suggests BV and possible bladder prolapse. - Perform urine analysis and swab test for yeast and BV. - Refer to gynecologist for further evaluation. - UA only with blood (just now getting off of period), no other abnormal findings.   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 Symptoms improve away from work, indicating work-related component. Unable to take anxiety medication. No SI/HI. - Refer to behavioral health for evaluation and management. - Consideration of FMLA for stress management. - Provide temporary work leave documentation if needed. - Coordinate with therapist for information sharing. - Consider behavioral health urgent care for immediate support.    No orders of the defined types were placed in this encounter.   Return if symptoms worsen or fail to improve.  Everlina Hock, NP

## 2024-03-14 ENCOUNTER — Ambulatory Visit: Payer: Self-pay | Admitting: Family Medicine

## 2024-03-14 ENCOUNTER — Encounter: Payer: Self-pay | Admitting: Obstetrics and Gynecology

## 2024-03-14 ENCOUNTER — Telehealth: Payer: Self-pay | Admitting: Neurology

## 2024-03-14 ENCOUNTER — Ambulatory Visit: Admitting: Obstetrics and Gynecology

## 2024-03-14 VITALS — BP 113/79 | HR 90 | Ht 63.0 in | Wt 224.0 lb

## 2024-03-14 DIAGNOSIS — Z1331 Encounter for screening for depression: Secondary | ICD-10-CM | POA: Diagnosis not present

## 2024-03-14 DIAGNOSIS — M7918 Myalgia, other site: Secondary | ICD-10-CM

## 2024-03-14 LAB — CERVICOVAGINAL ANCILLARY ONLY
Bacterial Vaginitis (gardnerella): NEGATIVE
Candida Glabrata: NEGATIVE
Candida Vaginitis: NEGATIVE
Comment: NEGATIVE
Comment: NEGATIVE
Comment: NEGATIVE

## 2024-03-14 MED ORDER — CYCLOBENZAPRINE HCL 5 MG PO TABS: 5.0000 mg | ORAL_TABLET | Freq: Three times a day (TID) | ORAL | 0 refills | Status: DC | PRN

## 2024-03-14 NOTE — Telephone Encounter (Signed)
 Patient made aware, she has some 5 mg left so she will take one of those for now. Will call with any other questions.

## 2024-03-14 NOTE — Progress Notes (Signed)
     NEW GYNECOLOGY VISIT  Subjective:  Cassidy [name] is a 43 y.o. G1P0 with LMP 03/07/24 presenting for 3 days of acute pelvic pressure/pain  Note written with assistance from AI scribe software. Patient provided verbal consent to proceed.   She has been experiencing pelvic pressure and pain since Monday. The discomfort initially seemed related to bowel movements but persisted. The pain worsens when sitting and slightly improves when lying down, particularly when shifting positions, though relief is temporary. She describes the pain as a 'horrible pressure' that temporarily eases after a bowel movement but quickly returns. She has not taken any pain medication like Tylenol  or ibuprofen.  She denies any recent changes in lotions, creams, douching, or new clothing that could contribute to her symptoms. She uses Vagisil externally due to allergies to regular soaps. No fever or chills. No atypical discharge. No recent intercourse, same sex partner.   Her social history includes recent significant stressors, including the passing of her dog, work stress, and her mother's hospitalization with autoimmune hemolytic anemia. She has is currently caring for a new dog, which requires occasional lifting.  I personally reviewed PCP note by Minna Amass NP 03/13/24, urine dip with +blood but otherwise normal 03/13/24 and pap AGC-NOS/HPV neg 02/18/21 with benign colpo/ECC.   Objective:   Vitals:   03/14/24 1011  BP: 113/79  Pulse: 90  Weight: 224 lb (101.6 kg)  Height: 5' 3 (1.6 m)   General:  Alert, oriented and cooperative. Patient is in no acute distress.  Skin: Skin is warm and dry. No rash noted.   Cardiovascular: Normal heart rate noted  Respiratory: Normal respiratory effort, no problems with respiration noted  Abdomen: Soft, non-tender, non-distended   Pelvic: NEFG. Perianal erythema and mild vulvar erythema - nonpainful, non pruritic. Negative q tip test. Significant tenderness of levators and  obturators as well as anterior abdominal wall. No uterine/adnexal tenderness.   Exam performed in the presence of a chaperone  Assessment and Plan:  Cassidy [name] is a 43 y.o. with 3 days of suspected myofascial pelvic pain.   Myofascial pain Suspect myofascial etiology of her pain made worse by frequent diarrhea/bowel movements. No e/o torsion, PID on exam today. SS partner, defer UPT STI/BV/yeast swab pending from PCP Will trial tylenol  & flexeril for relief of acute pain Information given on stretches for pelvic floor relaxation PCP has patient off of work for this week. If continued pain, OK to write off for work until our follow up appointment next week.  -     cyclobenzaprine (FLEXERIL) 5 MG tablet; Take 1 tablet (5 mg total) by mouth every 8 (eight) hours as needed for muscle spasms.  Routine- due for pap, will obtain next visit if pain improved  Return in about 1 week (around 03/21/2024) for follow up pelvic pressure .  Future Appointments  Date Time Provider Department Center  03/21/2024  3:50 PM Izell Marsh, MD [facility]  04/11/2024  9:20 AM Everlina Hock, NP [id number]   Izell Marsh, MD

## 2024-03-14 NOTE — Telephone Encounter (Signed)
 I'm not seeing any significant interactions when I look it up. If she wants to skip 1 dose it shouldn't be a problem with restarting at current dose at that time. If skipping more than 2 doses, would probably need to restart at lowest dose.

## 2024-03-14 NOTE — Patient Instructions (Signed)
 [place] Pelvic Floor Sequence - stretch GENTLY   [email or web address]://[email or web address].nhs.uk/wp-content/uploads/[email or web address]

## 2024-03-14 NOTE — Telephone Encounter (Signed)
 Please advise.   Copied from CRM [id number]. Topic: Clinical - Pink Word Triage >> Mar 14, 2024  1:37 PM [place] wrote: Reason for CRM: Started taking a muscle relaxer for pelvic muscle spasms. She is 4 days late for taking zepbound  . there are side effects with zepbound  and flexeril. She is wanting to know if she should skip her zepbound  dose and take it next week and if she should stay at the same dose or go back down. [phone]

## 2024-03-16 ENCOUNTER — Encounter: Payer: Self-pay | Admitting: Obstetrics and Gynecology

## 2024-03-21 ENCOUNTER — Ambulatory Visit: Admitting: Obstetrics and Gynecology

## 2024-03-21 VITALS — BP 115/84 | HR 80 | Ht 63.5 in | Wt 225.0 lb

## 2024-03-21 DIAGNOSIS — M7918 Myalgia, other site: Secondary | ICD-10-CM

## 2024-03-21 NOTE — Progress Notes (Signed)
   RETURN GYNECOLOGY VISIT  Subjective:  Cassidy [name] is a 43 y.o. G1P0 with LMP 03/07/24 presenting for follow up of myofascial pelvic pain/pressure  See note from last week (6/18) for details of history. In brief, she had severe pain/pressure in the vagina/rectum/perineum that was elicited on exam with palpation of the levators & obturators as well as anterior abdominal wall. BV/yeast & urine dip were unremarkable. We discussed rest, heat, stretching, and flexeril.   Pt reports that her pain persisted for ~2 more days after she saw me, but then finally resolved over the weekend. No longer needing pain medication. Is going back to work next week. Has not tried stretches.   Objective:   Vitals:   03/21/24 1553  BP: 115/84  Pulse: 80  Weight: 225 lb (102.1 kg)  Height: 5' 3.5 (1.613 m)   General:  Alert, oriented and cooperative. Patient is in no acute distress.  Skin: Skin is warm and dry. No rash noted.   Cardiovascular: Normal heart rate noted  Respiratory: Normal respiratory effort, no problems with respiration noted   Assessment and Plan:  Cassidy [name] is a 43 y.o. with myofascial pelvic pain, resolved  Recommended regular pelvic floor stretches to prevent recurrence of her symptoms esp when she is back to work Reviewed we now have a strategy to manage her pain if this reoccurs Can consider PFPT if this becomes a persistent problem, but defer for now given resolution of her pain  Reviewed she is due for pap, return for annual   Total encounter time: 20 minutes  Return in about 3 months (around 06/21/2024) for annual exam with pap.  Future Appointments  Date Time Provider Department Center  04/11/2024  9:20 AM Almarie Waddell NOVAK, NP [facility]   Kieth JAYSON Carolin, MD

## 2024-03-28 NOTE — Telephone Encounter (Signed)
 Forms completed and faxed to [facility]. Confirmation received. Copy to scan. Copy to hold.

## 2024-04-02 ENCOUNTER — Ambulatory Visit: Admitting: Family Medicine

## 2024-04-02 ENCOUNTER — Other Ambulatory Visit: Payer: Self-pay | Admitting: Cardiology

## 2024-04-02 ENCOUNTER — Encounter: Payer: Self-pay | Admitting: Family Medicine

## 2024-04-02 ENCOUNTER — Other Ambulatory Visit: Payer: Self-pay | Admitting: Nurse Practitioner

## 2024-04-02 VITALS — BP 119/78 | HR 79 | Ht 63.5 in | Wt 224.0 lb

## 2024-04-02 DIAGNOSIS — R42 Dizziness and giddiness: Secondary | ICD-10-CM

## 2024-04-02 DIAGNOSIS — F419 Anxiety disorder, unspecified: Secondary | ICD-10-CM

## 2024-04-02 MED ORDER — BUSPIRONE HCL 5 MG PO TABS: 5.0000 mg | ORAL_TABLET | Freq: Two times a day (BID) | ORAL | 1 refills | Status: DC

## 2024-04-02 MED ORDER — MECLIZINE HCL 25 MG PO TABS: 25.0000 mg | ORAL_TABLET | Freq: Three times a day (TID) | ORAL | 0 refills | Status: DC | PRN

## 2024-04-02 NOTE — Patient Instructions (Addendum)
 [facility] [place] office- [phone]

## 2024-04-02 NOTE — Progress Notes (Signed)
 Acute Office Visit  Subjective:     Patient ID: Cassidy [name], female    DOB: Oct 25, 1980, 43 y.o.   MRN: [id number]  Chief Complaint  Patient presents with   Medical Management of Chronic Issues    HPI Patient is in today for ear pressure, vertigo, stress.   Discussed the use of AI scribe software for clinical note transcription with the patient, who gave verbal consent to proceed.  History of Present Illness Cassidy [name] is a 43 year old female with PTSD who presents with difficulty managing work-related stress and anxiety.  She experiences significant stress and anxiety related to her work environment, which she attributes to her PTSD. She feels unprepared and freezes when confronted with unexpected situations, leading to increased anxiety and stress. She feels unsupported by her supervisor, who responded sarcastically to her request for an agenda. She is concerned about potential performance issues at work due to her PTSD and fears losing her job.  She has not been able to consistently see her therapist every two weeks and is seeking local behavioral health support. She has been referred to a different location due to insurance issues and is trying to schedule an appointment with a local provider. She is currently not on any medication for her anxiety due to concerns about interactions with her heart medication, flecainide . She has previously taken Lamictal and is currently taking propranolol  for her heart condition, which is also sometimes used for anxiety.  She experiences vertigo, which she believes is related to stress. The vertigo is worse at night and occurs with fast movements, causing loss of balance and spinning sensations. She has noticed fluid in her ears upon waking, but denies ear pain. She has been experiencing cold or allergy symptoms, including coughing and sinus congestion, for the past five to six days.  She is currently taking flecainide  for her heart condition and  propranolol , which is also used for anxiety. She takes omnisartan for blood pressure management. She has previously taken meclizine  for vertigo but is cautious about using it due to its sedative effects. She is considering stopping her GLP-1 shots, as she has been managing her weight through diet and exercise, losing weight steadily.        ROS All review of systems negative except what is listed in the HPI      Objective:    BP 119/78   Pulse 79   Ht 5' 3.5 (1.613 m)   Wt 224 lb (101.6 kg)   LMP 03/07/2024 (Exact Date)   SpO2 100%   BMI 39.06 kg/m    Physical Exam Vitals reviewed.  Constitutional:      General: She is not in acute distress.    Appearance: Normal appearance. She is obese.  HENT:     Head: Normocephalic and atraumatic.     Ears:     Comments: Mild bilateral middle ear effusion Declined Dix-Hallpike testing Cardiovascular:     Rate and Rhythm: Normal rate and regular rhythm.  Pulmonary:     Effort: Pulmonary effort is normal.     Breath sounds: Normal breath sounds.  Abdominal:     Palpations: Abdomen is soft. There is no mass.     Tenderness: There is no abdominal tenderness. There is no guarding.  Skin:    General: Skin is dry.  Neurological:     Mental Status: She is alert and oriented to person, place, and time.  Psychiatric:        Mood  and Affect: Mood normal.        Behavior: Behavior normal.        Thought Content: Thought content normal.        Judgment: Judgment normal.          No results found for any visits on 04/02/24.      Assessment & Plan:   Problem List Items Addressed This Visit       Active Problems   Anxiety - Primary   Relevant Medications   busPIRone  (BUSPAR ) 5 MG tablet   Other Relevant Orders   Ambulatory referral to Behavioral Health   Other Visit Diagnoses       Vertigo       Relevant Medications   meclizine  (ANTIVERT ) 25 MG tablet       Assessment & Plan Anxiety/Post-Traumatic Stress  Disorder (PTSD) Significant stress and anxiety impacting work performance. Supervisor unsupportive. Not on medication. Buspar  discussed for anxiety, no interaction with flecainide . 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 - Provide contact number for [facility]. - Prescribe Buspar  for anxiety. - Advise contacting HR regarding FMLA and potential leave reinstatement.   Vertigo Some URI symptoms as well, likely viral. - Prescribe meclizine  if needed. - Recommend Flonase  nasal spray. - Advise steam showers, warm compresses, saline nasal irrigation. - Instruct to report if symptoms persist beyond ten days.       Meds ordered this encounter  Medications   busPIRone  (BUSPAR ) 5 MG tablet    Sig: Take 1 tablet (5 mg total) by mouth 2 (two) times daily.    Dispense:  60 tablet    Refill:  1    Supervising Provider:   DOMENICA BLACKBIRD A [[id number]]   meclizine  (ANTIVERT ) 25 MG tablet    Sig: Take 1 tablet (25 mg total) by mouth 3 (three) times daily as needed for dizziness.    Dispense:  30 tablet    Refill:  0    Supervising Provider:   DOMENICA BLACKBIRD A [[id number]]    Return if symptoms worsen or fail to improve.  Waddell KATHEE Mon, NP

## 2024-04-03 ENCOUNTER — Encounter: Payer: Self-pay | Admitting: Family Medicine

## 2024-04-03 DIAGNOSIS — F4310 Post-traumatic stress disorder, unspecified: Secondary | ICD-10-CM | POA: Insufficient documentation

## 2024-04-06 ENCOUNTER — Ambulatory Visit (INDEPENDENT_AMBULATORY_CARE_PROVIDER_SITE_OTHER): Admitting: Licensed Clinical Social Worker

## 2024-04-06 DIAGNOSIS — F4310 Post-traumatic stress disorder, unspecified: Secondary | ICD-10-CM
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
## 2024-04-10 ENCOUNTER — Other Ambulatory Visit: Payer: Self-pay

## 2024-04-10 MED ORDER — PROPRANOLOL HCL 10 MG PO TABS: 10.0000 mg | ORAL_TABLET | Freq: Every day | ORAL | 0 refills | Status: DC

## 2024-04-11 ENCOUNTER — Ambulatory Visit: Admitting: Family Medicine

## 2024-04-11 ENCOUNTER — Encounter: Payer: Self-pay | Admitting: Family Medicine

## 2024-04-11 ENCOUNTER — Ambulatory Visit: Payer: Self-pay | Admitting: Family Medicine

## 2024-04-11 ENCOUNTER — Other Ambulatory Visit (HOSPITAL_COMMUNITY): Payer: Self-pay

## 2024-04-11 ENCOUNTER — Telehealth: Payer: Self-pay

## 2024-04-11 VITALS — BP 127/70 | HR 82 | Ht 63.5 in | Wt 223.0 lb

## 2024-04-11 DIAGNOSIS — I10 Essential (primary) hypertension: Secondary | ICD-10-CM

## 2024-04-11 DIAGNOSIS — E063 Autoimmune thyroiditis: Secondary | ICD-10-CM | POA: Diagnosis not present

## 2024-04-11 DIAGNOSIS — Z6838 Body mass index (BMI) 38.0-38.9, adult: Secondary | ICD-10-CM

## 2024-04-11 DIAGNOSIS — E782 Mixed hyperlipidemia: Secondary | ICD-10-CM

## 2024-04-11 DIAGNOSIS — I4719 Other supraventricular tachycardia: Secondary | ICD-10-CM

## 2024-04-11 DIAGNOSIS — F419 Anxiety disorder, unspecified: Secondary | ICD-10-CM

## 2024-04-11 DIAGNOSIS — R000 Tachycardia, unspecified: Secondary | ICD-10-CM

## 2024-04-11 LAB — CBC WITH DIFFERENTIAL/PLATELET
Basophils Absolute: 0.1 K/uL (ref 0.0–0.1)
Basophils Relative: 1 % (ref 0.0–3.0)
Eosinophils Absolute: 0.4 K/uL (ref 0.0–0.7)
Eosinophils Relative: 4.4 % (ref 0.0–5.0)
HCT: 45.8 % (ref 36.0–46.0)
Hemoglobin: 15.2 g/dL — ABNORMAL HIGH (ref 12.0–15.0)
Lymphocytes Relative: 22.4 % (ref 12.0–46.0)
Lymphs Abs: 2.1 K/uL (ref 0.7–4.0)
MCHC: 33.2 g/dL (ref 30.0–36.0)
MCV: 95.3 fl (ref 78.0–100.0)
Monocytes Absolute: 0.8 K/uL (ref 0.1–1.0)
Monocytes Relative: 8.4 % (ref 3.0–12.0)
Neutro Abs: 6 K/uL (ref 1.4–7.7)
Neutrophils Relative %: 63.8 % (ref 43.0–77.0)
Platelets: 245 K/uL (ref 150.0–400.0)
RBC: 4.8 Mil/uL (ref 3.87–5.11)
RDW: 14.9 % (ref 11.5–15.5)
WBC: 9.3 K/uL (ref 4.0–10.5)

## 2024-04-11 LAB — LIPID PANEL
Cholesterol: 235 mg/dL — ABNORMAL HIGH (ref 0–200)
HDL: 43.3 mg/dL (ref >39.00)
LDL Cholesterol: 158 mg/dL — ABNORMAL HIGH (ref 0–99)
NonHDL: 191.79
Total CHOL/HDL Ratio: 5
Triglycerides: 167 mg/dL — ABNORMAL HIGH (ref 0.0–149.0)
VLDL: 33.4 mg/dL (ref 0.0–40.0)

## 2024-04-11 NOTE — Assessment & Plan Note (Signed)
 Blood pressure is at goal for age and co-morbidities.   Recommendations: continue olmesartan, propranolol  - BP goal <130/80 - monitor and log blood pressures at home - check around the same time each day in a relaxed setting - Limit salt to <2000 mg/day - Follow DASH eating plan (heart healthy diet) - limit alcohol to 2 standard drinks per day for men and 1 per day for women - avoid tobacco products - get at least 2 hours of regular aerobic exercise weekly Patient aware of signs/symptoms requiring further/urgent evaluation.

## 2024-04-11 NOTE — Assessment & Plan Note (Signed)
 Stable on current regimen. Following with cardiology.

## 2024-04-11 NOTE — Telephone Encounter (Signed)
 Sent message to make patient aware

## 2024-04-11 NOTE — Assessment & Plan Note (Signed)
-   Continue Zepbound  at 7.5 mg. - Monitor weight loss and adjust dosage if plateau occurs. - Encourage healthy eating and exercise.

## 2024-04-11 NOTE — Assessment & Plan Note (Signed)
 Continue Synthroid at current dose - seeing endocrinology soon

## 2024-04-11 NOTE — Assessment & Plan Note (Signed)
 No SI/HI - Discontinue Buspar  - caused headaches - Attend psychiatric appointment on July 23 - Encourage lifestyle measures

## 2024-04-11 NOTE — Progress Notes (Signed)
 Established Patient Office Visit  Subjective   Patient ID: Cassidy [name], female    DOB: 1981/06/16  Age: 43 y.o. MRN: [id number]  Chief Complaint  Patient presents with   Medical Management of Chronic Issues    HPI   Patient is here for chronic disease management, routine follow-up.    Hypothyroidism: - Management: levothyroxine 200 mcg daily  - Taking medications as prescribed in the morning, apart from other foods, meds, vitamins, etc.  - No recent changes to hair, skin, nails, energy levels - Sees [facility] - due next month.   Hyperlipidemia: - medications: none - compliance: n/a  - medication SEs: n/a The 10-year ASCVD risk score (Arnett DK, et al., 2019) is: 6.9%   Values used to calculate the score:     Age: 64 years     Clincally relevant sex: Female     Is Non-Hispanic African American: No     Diabetic: No     Tobacco smoker: Yes     Systolic Blood Pressure: 127 mmHg     Is BP treated: Yes     HDL Cholesterol: 43.3 mg/dL     Total Cholesterol: 235 mg/dL   Hypertension, tachycardia (PAT): - Medications: flecainide  100 mg BID, olmesartan  20 mg daily, propranolol  10 mg daily - Compliance: good - Checking BP at home: stable - Denies any SOB, recurrent headaches, CP, vision changes, LE edema, dizziness, palpitations, or medication side effects. - Follows with cardiology annually. Due to see them.    Obesity: - Current medication management: Zepbound  7.5 mg/week - Current Diet/Nutrition: low carb, heart healthy - Current Exercise: minimal  Wt Readings from Last 3 Encounters:  04/11/24 223 lb (101.2 kg)  04/02/24 224 lb (101.6 kg)  03/21/24 225 lb (102.1 kg)    Mood follow-up: - Diagnosis: anxiety, PTSD - Treatment: Buspar  5 mg BID - Medication side effects: felt off but it did help with the anxiety - SI/HI: no - Update: She drank alcohol with it and it gave her a terrible migraine - she prefers to not take the Buspar  for now.  Establishing with [facility] to further evaluate/manage. She states they will also take over her work absences/FMLA.        ROS All review of systems negative except what is listed in the HPI     Objective:     BP 127/70   Pulse 82   Ht 5' 3.5 (1.613 m)   Wt 223 lb (101.2 kg)   LMP 03/07/2024 (Exact Date)   SpO2 99%   BMI 38.88 kg/m    Physical Exam Vitals reviewed.  Constitutional:      General: She is not in acute distress.    Appearance: Normal appearance. She is obese. She is not ill-appearing.  Cardiovascular:     Rate and Rhythm: Normal rate and regular rhythm.  Pulmonary:     Effort: Pulmonary effort is normal.     Breath sounds: Normal breath sounds.  Neurological:     Mental Status: She is alert and oriented to person, place, and time.  Psychiatric:        Mood and Affect: Mood normal.        Behavior: Behavior normal.        Thought Content: Thought content normal.        Judgment: Judgment normal.         The 10-year ASCVD risk score (Arnett DK, et al., 2019) is: 6.9%    Assessment &  Plan:   Problem List Items Addressed This Visit       Active Problems   Hypothyroidism - Primary   Continue Synthroid at current dose - seeing endocrinology soon        Tachycardia   Stable on current regimen. Following with cardiology.      Morbid obesity (HCC)   - Continue Zepbound  at 7.5 mg. - Monitor weight loss and adjust dosage if plateau occurs. - Encourage healthy eating and exercise.      Mixed hyperlipidemia   Medication management: lifestyle measures only Lifestyle factors for lowering cholesterol include: Diet therapy - heart-healthy diet rich in fruits, veggies, fiber-rich whole grains, lean meats, chicken, fish (at least twice a week), fat-free or 1% dairy products; foods low in saturated/trans fats, cholesterol, sodium, and sugar. Mediterranean diet has shown to be very heart healthy. Regular exercise - recommend at  least 30 minutes a day, 5 times per week Weight management        Relevant Orders   Lipid panel (Completed)   PAT (paroxysmal atrial tachycardia) (HCC)   Stable on current regimen. Following with cardiology.      Anxiety   No SI/HI - Discontinue Buspar  - caused headaches - Attend psychiatric appointment on July 23 - Encourage lifestyle measures       Primary hypertension   Blood pressure is at goal for age and co-morbidities.   Recommendations: continue olmesartan , propranolol   - BP goal <130/80 - monitor and log blood pressures at home - check around the same time each day in a relaxed setting - Limit salt to <2000 mg/day - Follow DASH eating plan (heart healthy diet) - limit alcohol to 2 standard drinks per day for men and 1 per day for women - avoid tobacco products - get at least 2 hours of regular aerobic exercise weekly Patient aware of signs/symptoms requiring further/urgent evaluation.        Relevant Orders   CBC with Differential/Platelet (Completed)       Return in about 6 months (around 10/12/2024) for physical.    Waddell KATHEE Mon, NP

## 2024-04-11 NOTE — Assessment & Plan Note (Signed)
Medication management: lifestyle measures only Lifestyle factors for lowering cholesterol include: Diet therapy - heart-healthy diet rich in fruits, veggies, fiber-rich whole grains, lean meats, chicken, fish (at least twice a week), fat-free or 1% dairy products; foods low in saturated/trans fats, cholesterol, sodium, and sugar. Mediterranean diet has shown to be very heart healthy. Regular exercise - recommend at least 30 minutes a day, 5 times per week Weight management

## 2024-04-11 NOTE — Telephone Encounter (Signed)
 Pharmacy Patient Advocate Encounter   Received notification from CoverMyMeds that prior authorization for Zepbound  7.5 is required/requested.   Insurance verification completed.   The patient is insured through [facility] .   Per test claim: Product/service not covered, plan benefit exclusion.  Member should contact [facility] at [email or web address]

## 2024-04-18 ENCOUNTER — Ambulatory Visit (INDEPENDENT_AMBULATORY_CARE_PROVIDER_SITE_OTHER): Admitting: Licensed Clinical Social Worker

## 2024-04-18 DIAGNOSIS — F4310 Post-traumatic stress disorder, unspecified: Secondary | ICD-10-CM | POA: Diagnosis not present

## 2024-04-18 NOTE — Progress Notes (Signed)
 [facility] Counselor/Therapist Progress Note  Patient ID: Cassidy [name], MRN: [id number]    Date: 04/18/24  Time Spent: 0800  am - 0900 am : 60 Minutes  Treatment Type: Individual Therapy.  Reported Symptoms: Patient reports being stressed, withdrawn, anxiety. Patient reports a history of PTSD. Patient reports that mother is ill. Patient reports an anniversary of a still birth. Supervisor began sending emails that performance is poor.   Mental Status Exam: Appearance:  Casual     Behavior: Appropriate  Motor: Normal  Speech/Language:  Clear and Coherent  Affect: Appropriate  Mood: normal  Thought process: normal  Thought content:   WNL  Sensory/Perceptual disturbances:   WNL  Orientation: oriented to person, place, time/date, situation, day of week, month of year, and year  Attention: Good  Concentration: Good  Memory: WNL  Fund of knowledge:  Good  Insight:   Good  Judgment:  Good  Impulse Control: Good   Risk Assessment: Danger to Self:  No Self-injurious Behavior: No Danger to Others: No Duty to Warn:no Physical Aggression / Violence:No  Access to Firearms a concern: No  Gang Involvement:No   Subjective:   Cassidy [name] participated in person from [place] location. Cassidy consented to treatment. Therapist participated from office located the [place] location.    Cassidy presented for her session with a flat affect. Patient reports that she is depressed and anxious every day. She reports that work has negatively affected and triggered a trauma response. Cassidy [name] was open about previous trauma she has experienced and shared that she doesn't not like conflict and will be in flight mode to get away from it. Patient was requesting a copy of the paperwork and Clinician provided her a copy. Clinician stated that she cannot keep her out of work far beyond her current date due to patient needing to be seen by a provider to evaluate her for medication management.  Patient reports that she doesn't feel she can go back to work and states she doesn't feel comfortable leaving her home. Patient states that she is easily intimidated and she has encountered situations at work that have negatively impacted her emotional well being.  Clinician actively listened and provided support and feedback via verbal interaction. Clinician processed with patient, patients feelings and how she can begin to utilize coping skills to assist with her anxiety and fears. Clinician processed with patient the importance of awareness of her symptoms and ways to decrease her symptoms. Clinician processed with patient a need for accommodations should she return to work. Clinician processed with patient the need for an appointment with a psychiatrist. Patient assured Clinician that she has an appointment next week.  Cassidy [name] was engaged in session discussion and was motivated for treatment. Cassidy [name] will continue to utilize coping skills to assist in decreasing her anxiety and fears. Cassidy [name] will continue to engage in bi weekly therapy sessions. Treatment planning to be reviewed by 04/06/2025.     Interventions: Cognitive Behavioral Therapy, Motivational Interviewing and psycho education. Clinician conducted session in person at clinician's office at [facility]. Reviewed events since last session. Assessed patient's mood since last session and current mood. Clinician reviewed diagnoses and treatment recommendations. Provided psycho education related to diagnoses and treatment.   Diagnosis: PTSD   Damien Junk MSW, LCSW/DATE 04/18/2024

## 2024-04-20 ENCOUNTER — Encounter: Payer: Self-pay | Admitting: Cardiology

## 2024-04-23 NOTE — Telephone Encounter (Signed)
 Maccia, Melissa D, RPH-CPP to Evans, Erica L, RN  Tobb, Kardie, DO     04/23/24  3:57 PM I would recommend avoiding use due to QTc prolongation risk w/ flecainide . If it is used, EKG is certainly needed (baseline looks ok). I will defer to Dr. Sheena for final call.

## 2024-04-27 ENCOUNTER — Ambulatory Visit: Payer: Self-pay

## 2024-04-27 DIAGNOSIS — F1010 Alcohol abuse, uncomplicated: Secondary | ICD-10-CM

## 2024-04-27 NOTE — Progress Notes (Signed)
 Cassidy [name], Cassidy [name], Cassidy [name]   DOB:  [birth year]   MRN:  [id number]  PCP:  Almarie Waddell NOVAK, NP    Chief Complaint: Anxiety (Pt is meeting with trama specialist and virtual physiatrist /Pt would like to speak about medications (xanax) /I am not doing well with anxiety /Burning, throbbing pain in both hips more my L than the R, when I stand up it gets better   )   History of Present Illness:  Cassidy [name] is a 43 y.o. very pleasant female patient who presents with the following:  Primary pt of my partner Waddell Almarie seen today with concern of anxiety  I have not seen her myself in the past  She does have a counselor, Ninfa Junk She has history of A fib and it looks like cardiology has been counseling her about possible medication interactions between flecainide  and her other medications -her mental health team has been struggling to find antidepressant that she can safe typically  It looks like they are trying to use seroquel vs lamictal for trauma/ PTSD and bad anxiety.  Ideally patient would like to use Seroquel but she has used Lamictal before and found it to be acceptable.  It looks like Seroquel is not ideal to use with her flecainide  She is doing some counseling -she plans to start an intensive posttrauma therapy program coming up and is concerned about how she will tolerate this She tried buspar  in the past but not work well She is seeing a Dance movement psychotherapist but they are not able to do any controlled substance prescription-she wonders if I could help her out with some Xanax temporarily-she is also previously used Klonopin  However, on further conversation we realize she has a significant alcohol abuse problem which also needs to be addressed  She is on propranolol  but this does not help too much with her anxiety   She is having both  generalized anxiety and some panic attacks  Aqsa has always used some alcohol.  However recently she is trying to self- medicate with drinking more alcohol - she is drinking 5-7 drinks per day currently but she wants to quit and is willing to quit entirely.  She typically drinks bourbon.  Her previous alcohol usage was 3-4 mixed rinks per day we discussed using medication to help her quit safely and avoid withdrawal and she would like to do this She is not using other substances  Last drink about about 22 hours ago- she typically starts around 1 or 2 pm  She notes she feels anxious but otherwise does not have have any current symptoms of withdrawal  Lab Results  Component Value Date   TSH 1.56 03/07/2024     Patient Active Problem List   Diagnosis Date Noted   PTSD (post-traumatic stress disorder) 04/03/2024   Anxiety 02/03/2023   Primary hypertension 02/03/2023   Atrial fibrillation (HCC)    PAT (paroxysmal atrial tachycardia) (HCC)    Tachycardia    Restless leg syndrome    Morbid obesity (HCC)    Mixed hyperlipidemia    GERD (gastroesophageal reflux disease)    Autoimmune disorder (HCC)    Antiphospholipid antibody syndrome (HCC)    Hypothyroidism 09/03/2019    Past Medical History:  Diagnosis Date   Acquired hypothyroidism    endocrinologist-- Carmelita Clover NP ([facility] [place])  Antiphospholipid antibody syndrome [facility])    hematology/ oncology--- dr timmy;  dx age 54 work-up done due to still birth (umbilical cord w/ clot)   Endometrial polyp    GERD (gastroesophageal reflux disease)    Irregular menses    Migraines    Mixed hyperlipidemia    PAT (paroxysmal atrial tachycardia) [facility])    cardiologist--- dr marla. tobb;  long hx takes flecainide  and propranolol ;   event monitor 11-04-2020 symptomatic occasional PAC SVT likely PAT;  echo 10-29-2020 ef 6-65%   Postcoital bleeding    PSVT (paroxysmal supraventricular tachycardia) (HCC)    Restless leg syndrome    Uterine  leiomyoma    Vertigo    Wears glasses     Past Surgical History:  Procedure Laterality Date   COLONOSCOPY WITH ESOPHAGOGASTRODUODENOSCOPY (EGD)  2021   DILATATION & CURETTAGE/HYSTEROSCOPY WITH MYOSURE N/A 01/15/2022   Procedure: DILATATION & CURETTAGE/HYSTEROSCOPY WITH MYOSURE;  Surgeon: Lavoie, Marie-Lyne, MD;  Location: [facility];  Service: Gynecology;  Laterality: N/A;   HYSTEROSCOPY WITH NOVASURE N/A 01/15/2022   Procedure: HYSTEROSCOPY WITH NOVASURE;  Surgeon: Lavoie, Marie-Lyne, MD;  Location: [facility];  Service: Gynecology;  Laterality: N/A;   TONSILLECTOMY     child    Social History   Tobacco Use   Smoking status: Every Day    Current packs/day: 2.00    Average packs/day: 2.0 packs/day for 28.0 years (56.0 ttl pk-yrs)    Types: Cigarettes, E-cigarettes   Smokeless tobacco: Current   Tobacco comments:    01-13-2022  per pt smokes 5 cig per day and uses nicotine pouch's patient also vapes    02/23/2023 Patient smokes about a pack a week and uses a nicotine pouch  Vaping Use   Vaping status: Former   Devices: quit 2022  Substance Use Topics   Alcohol use: Yes    Alcohol/week: 7.0 - 14.0 standard drinks of alcohol    Types: 7 - 14 Standard drinks or equivalent per week   Drug use: Not Currently    Comment: Did use CBD, but stopped    Family History  Problem Relation Age of Onset   Breast cancer Maternal Grandmother    Hyperlipidemia Father    Hyperlipidemia Mother    Supraventricular tachycardia Mother    Atrial fibrillation Mother    Autoimmune disease Mother    Healthy Brother    Breast cancer Maternal Aunt     Allergies  Allergen Reactions   Statins     Muscle aches   Benadryl  [Diphenhydramine ] Other (See Comments)    restless leg, so I need gabapentin  with it   Ciprofloxacin     Flu-like symptoms with muscle aches   Nsaids Hives    PER PT THIS INCLUDES ALL TYPES OF NSAIDS THAT CAUSE HIVES   Other     Per pt  paprika causes tachycardia   Tolmetin Hives   Aspirin Hives    Medication list has been reviewed and updated.  Current Outpatient Medications on File Prior to Visit  Medication Sig Dispense Refill   cyclobenzaprine  (FLEXERIL ) 5 MG tablet Take 1 tablet (5 mg total) by mouth every 8 (eight) hours as needed for muscle spasms. 30 tablet 0   flecainide  (TAMBOCOR ) 100 MG tablet TAKE 1 TABLET BY MOUTH 2 TIMES A DAY 180 tablet 0   levothyroxine (SYNTHROID) 200 MCG tablet Take 200 mcg by mouth daily before breakfast.     meclizine  (ANTIVERT ) 25 MG tablet Take 1 tablet (25 mg total)  by mouth 3 (three) times daily as needed for dizziness. 30 tablet 0   Melatonin 2.5 MG CHEW Chew 5 mg by mouth at bedtime.     olmesartan  (BENICAR ) 20 MG tablet TAKE 1 TABLET BY MOUTH DAILY 30 tablet 1   propranolol  (INDERAL ) 10 MG tablet Take 1 tablet (10 mg total) by mouth daily. 90 tablet 0   tirzepatide  (ZEPBOUND ) 7.5 MG/0.5ML Pen INJECT 7.5 MILLIGRAMS INTO THE SKIN ONCE A WEEK 6 mL 1   No current facility-administered medications on file prior to visit.    Review of Systems:  As per HPI- otherwise negative.   Physical Examination: Vitals:   04/30/24 1105  BP: 122/82  Pulse: 77  SpO2: 97%   Vitals:   04/30/24 1105  Weight: 224 lb 9.6 oz (101.9 kg)  Height: 5' 3.5 (1.613 m)   Body mass index is 39.16 kg/m. Ideal Body Weight: Weight in (lb) to have BMI = 25: 143.1  GEN: no acute distress.  Obese, looks well.  Here with her spouse who is supportive HEENT: Atraumatic, Normocephalic.  Ears and Nose: No external deformity. CV: RRR, No M/G/R. No JVD. No thrill. No extra heart sounds. PULM: CTA B, no wheezes, crackles, rhonchi. No retractions. No resp. distress. No accessory muscle use. ABD: S, NT, ND EXTR: No c/c/e PSYCH: Normally interactive. Conversant.    Assessment and Plan: Alcohol abuse - Plan: chlordiazePOXIDE  (LIBRIUM ) 25 MG capsule  GAD (generalized anxiety disorder) - Plan: clonazePAM   (KLONOPIN ) 0.5 MG tablet  Patient seen today with concern of severe anxiety but also as it turns out a significant alcohol abuse problem.  She would like to quit drinking and is interested in using medication to help her stop safely.  I prescribed her a Librium  taper as follows, suggest that she start with 50 mg per dose every 6 hours for the next day and taper from there.  I reminded her of maximum dose of 300 mg daily Symptom-triggered regimen: 25 to 100 mg PO every 4 to 6 hours as needed on day 1, then every 6 to 12 hours as needed on days 2 and 3, and every 12 to 24 hours as needed on days 4 and 5.  Assuming she is able to come off alcohol she can then use clonazepam  as needed for anxiety  I will also touch base with her cardiologist, Dr. Sheena about her flecainide .  We wonder if there is anything else she could possibly use as flecainide  has so many drug interactions Patient notes she would be willing to try Lamictal if Seroquel is not possible  Signed Harlene Schroeder, MD  repeated as needed, with a maximum of 300 mg/day.[

## 2024-04-27 NOTE — Telephone Encounter (Signed)
 FYI Only or Action Required?: FYI only for provider.  Patient was last seen in primary care on 04/11/2024 by Almarie Waddell NOVAK, NP.  Called Nurse Triage reporting Anxiety.  Symptoms began a week ago.  Interventions attempted: Nothing.  Symptoms are: unchanged.  Triage Disposition: No disposition on file.  Patient/caregiver understands and will follow disposition?:      Copied from CRM [id number]. Topic: Appointments - Appointment Scheduling >> Apr 27, 2024 11:07 AM Harlene ORN wrote: Anxiety getting worse Reason for Disposition  MODERATE anxiety (e.g., persistent or frequent anxiety symptoms; interferes with sleep, school, or work)  Answer Assessment - Initial Assessment Questions 1. CONCERN: Did anything happen that prompted you to call today?      Anxiety is getting worse 2. ANXIETY SYMPTOMS: Can you describe how you (your loved one; patient) have been feeling? (e.g., tense, restless, panicky, anxious, keyed up, overwhelmed, sense of impending doom).      States STD application is giving her a hard time. 3. ONSET: How long have you been feeling this way? (e.g., hours, days, weeks)     Past week, states has been off meds due to an interaction with her heart issues.  States just wants an anxiety medication that will work. 4. SEVERITY: How would you rate the level of anxiety? (e.g., 0 - 10; or mild, moderate, severe).     moderate 5. FUNCTIONAL IMPAIRMENT: How have these feelings affected your ability to do daily activities? Have you had more difficulty than usual doing your normal daily activities? (e.g., getting better, same, worse; self-care, school, work, interactions)     yes 6. HISTORY: Have you felt this way before? Have you ever been diagnosed with an anxiety problem in the past? (e.g., generalized anxiety disorder, panic attacks, PTSD). If Yes, ask: How was this problem treated? (e.g., medicines, counseling, etc.)     anxiety 7. RISK OF HARM - SUICIDAL  IDEATION: Do you ever have thoughts of hurting or killing yourself? If Yes, ask:  Do you have these feelings now? Do you have a plan on how you would do this?     no 8. TREATMENT:  What has been done so far to treat this anxiety? (e.g., medicines, relaxation strategies). What has helped?     no 9. THERAPIST: Do you have a counselor or therapist? If Yes, ask: What is their name?     yes 10. POTENTIAL TRIGGERS: Do you drink caffeinated beverages (e.g., coffee, colas, teas), and how much daily? Do you drink alcohol or use any drugs? Have you started any new medicines recently?       no 11. PATIENT SUPPORT: Who is with you now? Who do you live with? Do you have family or friends who you can talk to?        Yes 12. OTHER SYMPTOMS: Do you have any other symptoms? (e.g., feeling depressed, trouble concentrating, trouble sleeping, trouble breathing, palpitations or fast heartbeat, chest pain, sweating, nausea, or diarrhea)       anxious 13. PREGNANCY: Is there any chance you are pregnant? When was your last menstrual period?       na  Protocols used: Anxiety and Panic Attack-A-AH

## 2024-04-30 ENCOUNTER — Ambulatory Visit: Admitting: Family Medicine

## 2024-04-30 ENCOUNTER — Encounter: Payer: Self-pay | Admitting: Family Medicine

## 2024-04-30 VITALS — BP 122/82 | HR 77 | Ht 63.5 in | Wt 224.6 lb

## 2024-04-30 DIAGNOSIS — F1010 Alcohol abuse, uncomplicated: Secondary | ICD-10-CM

## 2024-04-30 DIAGNOSIS — F411 Generalized anxiety disorder: Secondary | ICD-10-CM

## 2024-04-30 MED ORDER — CLONAZEPAM 0.5 MG PO TABS: ORAL_TABLET | ORAL | 1 refills | Status: DC

## 2024-04-30 MED ORDER — CHLORDIAZEPOXIDE HCL 25 MG PO CAPS: ORAL_CAPSULE | ORAL | 0 refills | Status: DC

## 2024-04-30 NOTE — Telephone Encounter (Signed)
 Copied from CRM [id number]. Topic: Clinical - Prescription Issue >> Apr 30, 2024 12:59 PM Aleatha C wrote: Reason for CRM: Patient is currently at pharmacy picking up her prescription for chlordiazePOXIDE  (LIBRIUM ) 25 MG capsule and the pharmacist says the quantity is only for 2 days and patient said its suppose to be for five and the amount of the pills are 40 but the pharmacist said the pills should be amount of 72

## 2024-04-30 NOTE — Patient Instructions (Addendum)
 It was good to see you today- I am sorry you are having such a hard time  We plan to help you safely stop drinking using a Librium  taper.  Please use this medication according to the instructions; you can adjust the dose as needed for symptoms.  Symptoms of alcohol withdrawal typically include shakiness, sweatiness, nausea and vomiting, feeling anxious.  Dose the Librium  as needed to control  any symptoms  If you are not able to stop drinking please do not use the Librium  and let me know  If you are able to successfully stop drinking, after you have completed 5 days of Librium  you can switch over to clonazepam  as needed for anxiety  I will reach out to cardiology for you regarding issues with flecainide 

## 2024-04-30 NOTE — Telephone Encounter (Signed)
 Called pharmacy- I don't need her to have enough librium  to take max dosage, please fill as we wrote the rx.  If she needs more I can refill for her

## 2024-04-30 NOTE — Telephone Encounter (Signed)
 Pended RX with changes per Pharmacy. Sign if appropriate.

## 2024-05-01 ENCOUNTER — Encounter: Payer: Self-pay | Admitting: Family Medicine

## 2024-05-02 ENCOUNTER — Telehealth: Payer: Self-pay

## 2024-05-02 ENCOUNTER — Ambulatory Visit: Admitting: Licensed Clinical Social Worker

## 2024-05-02 ENCOUNTER — Ambulatory Visit: Attending: Cardiology | Admitting: Cardiology

## 2024-05-02 ENCOUNTER — Encounter: Payer: Self-pay | Admitting: Cardiology

## 2024-05-02 DIAGNOSIS — I10 Essential (primary) hypertension: Secondary | ICD-10-CM

## 2024-05-02 DIAGNOSIS — F1090 Alcohol use, unspecified, uncomplicated: Secondary | ICD-10-CM

## 2024-05-02 DIAGNOSIS — I471 Supraventricular tachycardia, unspecified: Secondary | ICD-10-CM | POA: Diagnosis not present

## 2024-05-02 DIAGNOSIS — E782 Mixed hyperlipidemia: Secondary | ICD-10-CM

## 2024-05-02 MED ORDER — PROPRANOLOL HCL 10 MG PO TABS: 10.0000 mg | ORAL_TABLET | Freq: Three times a day (TID) | ORAL | 3 refills | Status: DC

## 2024-05-02 NOTE — Telephone Encounter (Signed)
 Copied from CRM #[id number]. Topic: Clinical - Medical Advice >> May 02, 2024  4:00 PM Frederich PARAS wrote: Reason for CRM: PTto get message to [facility], she tried  to schedule a follow  up apt. however the apts were too fa for pt (8/19).  talked to dr, SHEENA, he ad your good to go on the seroquel l, she is coming off  the flecainide , she needs to know f the seroquel that she was prescribed by the psychiatrist is the corrct dosage or is there a diff dosage you would like her to start on . The psychiatrist prescribed her seroquel 150 mg. Pt would also like for Dr. Watt to take over her care for her medication management because of her relationship with her cardiologist

## 2024-05-02 NOTE — Telephone Encounter (Signed)
 error

## 2024-05-02 NOTE — Progress Notes (Unsigned)
 Virtual Visit via Video Note   Because of Cassidy [name]'s co-morbid illnesses, she is at least at moderate risk for complications without adequate follow up.  This format is felt to be most appropriate for this patient at this time.  All issues noted in this document were discussed and addressed.  A limited physical exam was performed with this format.  Please refer to the patient's chart for her consent to telehealth for [facility].      Date:  05/04/2024   ID:  Cassidy [name], DOB 1981/09/06, MRN [id number]  Patient Location: Home Provider Location: Office/Clinic  Virtual Visit via Video  Note . I connected with the patient today by a   video enabled telemedicine application and verified that I am speaking with the correct person using two identifiers.  PCP:  Almarie Waddell NOVAK, NP  Cardiologist:  Dub Huntsman, DO  Electrophysiologist:  None   Evaluation Performed:  Follow-Up Visit  Chief Complaint:   I am having a hard time  History of Present Illness:    Cassidy [name] is a 43 y.o. female with a hx of  antiphospholipid syndrome not on anticoagulation plans to see hematology today, PVCs, history of SVT and hyperlipidemia is here today for follow-up visit.   She is having a hard time with her anxiety and depression and her PTDS have worsened.   She is concerned about potential interactions between her psychiatric medication, Seroquel, and her antiarrhythmic medication, flecainide , particularly regarding QT prolongation.  She is concerned about the logistics of frequent EKGs if she continues on flecainide .  She has been experiencing mental health challenges and has been self-medicating with alcohol, consuming five to seven drinks of bourbon daily for the past two months. She is currently on Librium  for alcohol withdrawal and reports doing well on it.  The patient does not have symptoms concerning for COVID-19 infection (fever, chills, cough, or new shortness of breath).     Past Medical History:  Diagnosis Date   Acquired hypothyroidism    endocrinologist-- Carmelita Clover NP ([facility] [place])   Antiphospholipid antibody syndrome [facility])    hematology/ oncology--- dr timmy;  dx age 11 work-up done due to still birth (umbilical cord w/ clot)   Endometrial polyp    GERD (gastroesophageal reflux disease)    Irregular menses    Migraines    Mixed hyperlipidemia    PAT (paroxysmal atrial tachycardia) [facility])    cardiologist--- dr marla. Irineo Gaulin;  long hx takes flecainide  and propranolol ;   event monitor 11-04-2020 symptomatic occasional PAC SVT likely PAT;  echo 10-29-2020 ef 6-65%   Postcoital bleeding    PSVT (paroxysmal supraventricular tachycardia) (HCC)    Restless leg syndrome    Uterine leiomyoma    Vertigo    Wears glasses    Past Surgical History:  Procedure Laterality Date   COLONOSCOPY WITH ESOPHAGOGASTRODUODENOSCOPY (EGD)  2021   DILATATION & CURETTAGE/HYSTEROSCOPY WITH MYOSURE N/A 01/15/2022   Procedure: DILATATION & CURETTAGE/HYSTEROSCOPY WITH MYOSURE;  Surgeon: Lavoie, Marie-Lyne, MD;  Location: [facility];  Service: Gynecology;  Laterality: N/A;   HYSTEROSCOPY WITH NOVASURE N/A 01/15/2022   Procedure: HYSTEROSCOPY WITH NOVASURE;  Surgeon: Lavoie, Marie-Lyne, MD;  Location: [facility];  Service: Gynecology;  Laterality: N/A;   TONSILLECTOMY     child     Current Meds  Medication Sig   chlordiazePOXIDE  (LIBRIUM ) 25 MG capsule Use for alcohol withdrawal prevention.  Take by mouth 25 to 100 mg PO  every 4 to 6 hours as needed on day 1, then every 6 to 12 hours as needed on days 2 and 3, and every 12 to 24 hours as needed on days 4 and 5. Once you complete day 5 stop use and change over to clonazepam  for routine anxiety control   clonazePAM  (KLONOPIN ) 0.5 MG tablet Take 0.25- 0.5 mg by mouth twice daily as needed for anxiety   levothyroxine (SYNTHROID) 200 MCG tablet Take 200 mcg by mouth daily before breakfast.    olmesartan  (BENICAR ) 20 MG tablet TAKE 1 TABLET BY MOUTH DAILY   [DISCONTINUED] flecainide  (TAMBOCOR ) 100 MG tablet TAKE 1 TABLET BY MOUTH 2 TIMES A DAY   [DISCONTINUED] propranolol  (INDERAL ) 10 MG tablet Take 1 tablet (10 mg total) by mouth daily.     Allergies:   Statins, Benadryl  [diphenhydramine ], Ciprofloxacin, Nsaids, Other, Tolmetin, and Aspirin   Social History   Tobacco Use   Smoking status: Every Day    Current packs/day: 2.00    Average packs/day: 2.0 packs/day for 28.0 years (56.0 ttl pk-yrs)    Types: Cigarettes, E-cigarettes   Smokeless tobacco: Current   Tobacco comments:    01-13-2022  per pt smokes 5 cig per day and uses nicotine pouch's patient also vapes    02/23/2023 Patient smokes about a pack a week and uses a nicotine pouch  Vaping Use   Vaping status: Former   Devices: quit 2022  Substance Use Topics   Alcohol use: Yes    Alcohol/week: 7.0 - 14.0 standard drinks of alcohol    Types: 7 - 14 Standard drinks or equivalent per week   Drug use: Not Currently    Comment: Did use CBD, but stopped     Family Hx: The patient's family history includes Atrial fibrillation in her mother; Autoimmune disease in her mother; Breast cancer in her maternal aunt and maternal grandmother; Healthy in her brother; Hyperlipidemia in her father and mother; Supraventricular tachycardia in her mother.  ROS:   Please see the history of present illness.    Depression and anxiery All other systems reviewed and are negative.   Prior CV studies:   The following studies were reviewed today:  Echo and zio Labs/Other Tests and Data Reviewed:    EKG:  No ECG reviewed.  Recent Labs: 08/08/2023: Magnesium  2.0 03/07/2024: ALT 29; BUN 12; Creat 0.79; Potassium 4.2; Sodium 136; TSH 1.56 04/11/2024: Hemoglobin 15.2; Platelets 245.0   Recent Lipid Panel Lab Results  Component Value Date/Time   CHOL 235 (H) 04/11/2024 09:58 AM   CHOL 247 (H) 08/08/2023 10:36 AM   TRIG 167.0 (H)  04/11/2024 09:58 AM   HDL 43.30 04/11/2024 09:58 AM   HDL 51 08/08/2023 10:36 AM   CHOLHDL 5 04/11/2024 09:58 AM   LDLCALC 158 (H) 04/11/2024 09:58 AM   LDLCALC 155 (H) 08/08/2023 10:36 AM    Wt Readings from Last 3 Encounters:  04/30/24 224 lb 9.6 oz (101.9 kg)  04/11/24 223 lb (101.2 kg)  04/02/24 224 lb (101.6 kg)     Objective:    Vital Signs:  LMP 04/27/2024      ASSESSMENT & PLAN:    Paroxysmal SVT Flecainide  discontinued due to interaction with Seroquel. Propranolol  adjusted- Discontinue flecainide . - Increase propranolol  to 10 mg three times a day.  Essential hypertension Blood pressure well-managed. Propranolol  adjustment not expected to affect control. - Continue olmesartan  20 mg daily.  Hyperlipidemia -  most recent lipid profile significantly elevated. She has had significant muscle  aches and side effect from statin. She needs evaluated for other lipid lowering medication but  currently she is not driving. Plan to start PCSK9 inhibitors at her next office visit  Alcohol use  Currently on Librium  for withdrawal, transitioning to Klonopin  planned. - Continue Librium  as prescribed. - Transition to Klonopin  after completing Librium  course.  Major depressive disorder and generalized anxiety disorder Seroquel initiated for mental health stabilization. Coordination with psychiatrist for management. - Coordinate with psychiatrist for ongoing mental health management.  COVID-19 Education: The signs and symptoms of COVID-19 were discussed with the patient and how to seek care for testing (follow up with PCP or arrange E-visit).  The importance of social distancing was discussed today.  Time:   Today, I have spent 15 minutes with the patient with telehealth technology discussing the above problems.     Medication Adjustments/Labs and Tests Ordered: Current medicines are reviewed at length with the patient today.  Concerns regarding medicines are outlined above.    Tests Ordered: No orders of the defined types were placed in this encounter.   Medication Changes: Meds ordered this encounter  Medications   propranolol  (INDERAL ) 10 MG tablet    Sig: Take 1 tablet (10 mg total) by mouth 3 (three) times daily.    Dispense:  270 tablet    Refill:  3    Follow Up:  In Person as scheduled  Signed, Vienna Folden, DO  05/04/2024 8:57 PM    [facility]

## 2024-05-02 NOTE — Patient Instructions (Addendum)
 Medication Instructions:  Your physician has recommended you make the following change in your medication:  STOP: Flecainide   START: Propanolol 10 mg three times daily  *If you need a refill on your cardiac medications before your next appointment, please call your pharmacy*  Follow-Up: At [facility], you and your health needs are our priority.  As part of our continuing mission to provide you with exceptional heart care, our providers are all part of one team.  This team includes your primary Cardiologist (physician) and Advanced Practice Providers or APPs (Physician Assistants and Nurse Practitioners) who all work together to provide you with the care you need, when you need it.  Your next appointment:    October 16th at 8:40a  Provider:   Kardie Tobb, DO

## 2024-05-07 ENCOUNTER — Telehealth: Payer: Self-pay | Admitting: Cardiology

## 2024-05-07 ENCOUNTER — Other Ambulatory Visit: Payer: Self-pay | Admitting: Cardiology

## 2024-05-07 NOTE — Telephone Encounter (Signed)
 Received a request from [employer] for office notes from 05/02/2024 to present.  One virtual office visit note was fax to them via Epic by Rolin Qualia, RN.

## 2024-05-09 ENCOUNTER — Ambulatory Visit: Admitting: Family Medicine

## 2024-05-09 ENCOUNTER — Encounter: Payer: Self-pay | Admitting: Family Medicine

## 2024-05-09 VITALS — BP 94/64 | HR 92 | Ht 63.5 in | Wt 223.0 lb

## 2024-05-09 DIAGNOSIS — F1010 Alcohol abuse, uncomplicated: Secondary | ICD-10-CM | POA: Diagnosis not present

## 2024-05-09 DIAGNOSIS — F419 Anxiety disorder, unspecified: Secondary | ICD-10-CM

## 2024-05-09 DIAGNOSIS — R000 Tachycardia, unspecified: Secondary | ICD-10-CM

## 2024-05-09 NOTE — Progress Notes (Signed)
 Established Patient Office Visit  Subjective   Patient ID: Cassidy [name], female    DOB: May 10, 1981  Age: 43 y.o. MRN: [id number]  Chief Complaint  Patient presents with   Medical Management of Chronic Issues    HPI   Discussed the use of AI scribe software for clinical note transcription with the patient, who gave verbal consent to proceed.  History of Present Illness Cassidy [name] is a 43 year old female with alcohol use disorder and supraventricular tachycardia who presents for follow-up after starting Seroquel and propranolol .  She has been experiencing increased alcohol consumption, reaching up to six to seven drinks a day, as a means to self-medicate for anxiety. She has since stopped alcohol consumption with the help of a Librium  taper and reports no alcohol intake since starting the taper (started by my colleague last week, Dr. Watt). Her family history is significant for alcoholism on her mother's side, with her Wylie Josie having died from alcohol-related causes.  She is currently on day three of Seroquel, which was prescribed by psych to manage her anxiety. She checks her blood pressure at night after taking Seroquel and notes that it has been slightly low in the mornings. She is concerned about the sedative effects of Seroquel, which have been significant during the first few days of treatment. She has not needed to take her full dose of Klonopin  since starting Seroquel, only taking 0.25 mg around noon for anxiety. She reports each day, the Seroquel has been better and caused less drowsiness the following morning.   She was previously on flecainide  for SVT but was switched to propranolol  10 mg three times a day by her cardiologist given the significant interactions between flecainide  and psych meds. She takes propranolol  at 8 AM, 2-4 PM, and before bedtime. She has not experienced palpitations or flutters since the medication change. Her resting heart rate has returned to the  80s and 90s and she is asymptomatic. She monitors her heart rate using a sleep number bed that tracks her heart rate and respirations.  She has an upcoming endocrinology appointment to monitor her thyroid  levels.  She is trying to reduce cigarette smoking, but is focusing on staying sober from alcohol for now and will try to quit smoking once she feels stable from everything else.        Past Medical History:  Diagnosis Date   Acquired hypothyroidism    endocrinologist-- Carmelita Clover NP ([facility]-- [place])   Antiphospholipid antibody syndrome [facility])    hematology/ oncology--- dr timmy;  dx age 3 work-up done due to still birth (umbilical cord w/ clot)   Endometrial polyp    GERD (gastroesophageal reflux disease)    Irregular menses    Migraines    Mixed hyperlipidemia    PAT (paroxysmal atrial tachycardia) [facility])    cardiologist--- dr marla. tobb;  long hx takes flecainide  and propranolol ;   event monitor 11-04-2020 symptomatic occasional PAC SVT likely PAT;  echo 10-29-2020 ef 6-65%   Postcoital bleeding    PSVT (paroxysmal supraventricular tachycardia) (HCC)    Restless leg syndrome    Uterine leiomyoma    Vertigo    Wears glasses        ROS All review of systems negative except what is listed in the HPI    Objective:     BP 94/64   Pulse 92   Ht 5' 3.5 (1.613 m)   Wt 223 lb (101.2 kg)   LMP 04/27/2024  SpO2 98%   BMI 38.88 kg/m    Physical Exam Vitals reviewed.  Constitutional:      Appearance: Normal appearance. She is obese.  Cardiovascular:     Rate and Rhythm: Normal rate and regular rhythm.     Heart sounds: Normal heart sounds.  Pulmonary:     Effort: Pulmonary effort is normal.     Breath sounds: Normal breath sounds.  Neurological:     Mental Status: She is alert and oriented to person, place, and time.  Psychiatric:        Mood and Affect: Mood normal.        Behavior: Behavior normal.        Thought Content: Thought content normal.         Judgment: Judgment normal.           No results found for any visits on 05/09/24.    The 10-year ASCVD risk score (Arnett DK, et al., 2019) is: 3.8%    Assessment & Plan:   Problem List Items Addressed This Visit       Active Problems   Tachycardia   Anxiety - Primary   Other Visit Diagnoses       Alcohol abuse          Assessment and Plan Assessment & Plan Alcohol use disorder, in early remission Alcohol use disorder in early remission, successfully tapered off alcohol with Librium . No alcohol consumption since taper completed. Strong family history of alcoholism. Committed to sobriety. - Encourage use of mocktails as alcohol substitutes. - Discuss accountability and reward system for maintaining sobriety.  Tachycardia Managed with propranolol  after transitioning from flecainide  due to interactions with Seroquel. No palpitations reported. Heart rate stable, occasional rise with activity. Caffeine intake may need adjustment. Blood pressure monitoring essential due to potential drops with propranolol  and Seroquel. - Continue propranolol  10 mg three times a day. - Monitor heart rate and blood pressure regularly. - Avoid excessive caffeine intake. - Follow up with cardiology as scheduled.   Generalized anxiety disorder Managed with Seroquel and Klonopin . Seroquel causing sedation and low blood pressure in the mornings. Klonopin  used as needed with reduced dosage since starting Seroquel. Monitoring of blood pressure crucial, especially in the mornings. - Continue Seroquel 150 mg extended release at night. May need reduced dose if not adjust well over this week. - Continue Klonopin  0.25 mg as needed for anxiety. - Follow up with virtual psychiatrist on August 26th.   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 Seeking accommodations to return to previous job role. Current role exacerbates symptoms. - Continue therapy with  trauma therapist. - Explore job accommodations to avoid customer-facing roles.     Return in about 1 month (around 06/09/2024) for seroquel lab follow-up.    Waddell KATHEE Mon, NP

## 2024-05-10 ENCOUNTER — Telehealth: Payer: Self-pay | Admitting: Neurology

## 2024-05-10 NOTE — Telephone Encounter (Signed)
 Received request from [facility] for records from July 1st to present. Notes faxed with confirmation received.

## 2024-05-30 ENCOUNTER — Ambulatory Visit: Admitting: Family Medicine

## 2024-05-30 ENCOUNTER — Encounter: Payer: Self-pay | Admitting: Family Medicine

## 2024-05-30 VITALS — BP 96/68 | HR 88 | Ht 63.5 in | Wt 221.0 lb

## 2024-05-30 DIAGNOSIS — E538 Deficiency of other specified B group vitamins: Secondary | ICD-10-CM | POA: Diagnosis not present

## 2024-05-30 DIAGNOSIS — E782 Mixed hyperlipidemia: Secondary | ICD-10-CM | POA: Diagnosis not present

## 2024-05-30 DIAGNOSIS — I10 Essential (primary) hypertension: Secondary | ICD-10-CM | POA: Diagnosis not present

## 2024-05-30 DIAGNOSIS — R1310 Dysphagia, unspecified: Secondary | ICD-10-CM | POA: Diagnosis not present

## 2024-05-30 DIAGNOSIS — Z131 Encounter for screening for diabetes mellitus: Secondary | ICD-10-CM | POA: Diagnosis not present

## 2024-05-30 DIAGNOSIS — R000 Tachycardia, unspecified: Secondary | ICD-10-CM | POA: Diagnosis not present

## 2024-05-30 DIAGNOSIS — R09A2 Foreign body sensation, throat: Secondary | ICD-10-CM | POA: Diagnosis not present

## 2024-05-30 DIAGNOSIS — F4310 Post-traumatic stress disorder, unspecified: Secondary | ICD-10-CM

## 2024-05-30 LAB — COMPREHENSIVE METABOLIC PANEL WITH GFR
ALT: 55 U/L — ABNORMAL HIGH (ref 0–35)
AST: 36 U/L (ref 0–37)
Albumin: 4.2 g/dL (ref 3.5–5.2)
Alkaline Phosphatase: 63 U/L (ref 39–117)
BUN: 11 mg/dL (ref 6–23)
CO2: 28 meq/L (ref 19–32)
Calcium: 9.3 mg/dL (ref 8.4–10.5)
Chloride: 101 meq/L (ref 96–112)
Creatinine, Ser: 0.83 mg/dL (ref 0.40–1.20)
GFR: 85.48 mL/min (ref >60.00)
Glucose, Bld: 78 mg/dL (ref 70–99)
Potassium: 4.7 meq/L (ref 3.5–5.1)
Sodium: 137 meq/L (ref 135–145)
Total Bilirubin: 0.9 mg/dL (ref 0.2–1.2)
Total Protein: 7.4 g/dL (ref 6.0–8.3)

## 2024-05-30 LAB — CBC WITH DIFFERENTIAL/PLATELET
Basophils Absolute: 0.1 K/uL (ref 0.0–0.1)
Basophils Relative: 0.8 % (ref 0.0–3.0)
Eosinophils Absolute: 0.3 K/uL (ref 0.0–0.7)
Eosinophils Relative: 3 % (ref 0.0–5.0)
HCT: 45.9 % (ref 36.0–46.0)
Hemoglobin: 15.3 g/dL — ABNORMAL HIGH (ref 12.0–15.0)
Lymphocytes Relative: 21.4 % (ref 12.0–46.0)
Lymphs Abs: 2.2 K/uL (ref 0.7–4.0)
MCHC: 33.4 g/dL (ref 30.0–36.0)
MCV: 94.3 fl (ref 78.0–100.0)
Monocytes Absolute: 0.6 K/uL (ref 0.1–1.0)
Monocytes Relative: 6 % (ref 3.0–12.0)
Neutro Abs: 7 K/uL (ref 1.4–7.7)
Neutrophils Relative %: 68.8 % (ref 43.0–77.0)
Platelets: 263 K/uL (ref 150.0–400.0)
RBC: 4.86 Mil/uL (ref 3.87–5.11)
RDW: 13.9 % (ref 11.5–15.5)
WBC: 10.1 K/uL (ref 4.0–10.5)

## 2024-05-30 LAB — B12 AND FOLATE PANEL
Folate: 4 ng/mL — ABNORMAL LOW (ref >5.9)
Vitamin B-12: 408 pg/mL (ref 211–911)

## 2024-05-30 LAB — LIPID PANEL
Cholesterol: 265 mg/dL — ABNORMAL HIGH (ref 0–200)
HDL: 38.1 mg/dL — ABNORMAL LOW (ref >39.00)
LDL Cholesterol: 181 mg/dL — ABNORMAL HIGH (ref 0–99)
NonHDL: 227.2
Total CHOL/HDL Ratio: 7
Triglycerides: 233 mg/dL — ABNORMAL HIGH (ref 0.0–149.0)
VLDL: 46.6 mg/dL — ABNORMAL HIGH (ref 0.0–40.0)

## 2024-05-30 LAB — EKG 12-LEAD

## 2024-05-30 LAB — HEMOGLOBIN A1C: Hgb A1c MFr Bld: 5.9 % (ref 4.6–6.5)

## 2024-05-30 MED ORDER — OLMESARTAN MEDOXOMIL 5 MG PO TABS: 10.0000 mg | ORAL_TABLET | Freq: Every day | ORAL | 3 refills | Status: DC

## 2024-05-30 NOTE — Progress Notes (Signed)
 Established Patient Office Visit  Subjective   Patient ID: Cassidy [name], female    DOB: [birth year]  Age: 43 y.o. MRN: [id number]  Chief Complaint  Patient presents with   Medical Management of Chronic Issues    HPI   Discussed the use of AI scribe software for clinical note transcription with the patient, who gave verbal consent to proceed.  History of Present Illness Cassidy [name] is a 43 year old female with hypertension and anxiety who presents for a recheck of Seroquel labs and blood pressure management.  She is experiencing low blood pressure and has recently stopped drinking alcohol. She is currently taking propranolol  (dx PAT) but notes a significant increase in pulse rate if she misses a dose, with morning pulse rates around 103-105 bpm after the effects of propranolol  wear off overnight. She is also on Olmesartan , which she cannot split, and feels it contributes to feeling sedated and sleepy in the mornings.  She has noticed a change in cravings, now preferring sugar over salt, which she attributes to Seroquel (managed by psychiatry). She has been consuming sugary cereals like Lucky Charms and Cocoa Puffs, which is unusual for her as she typically craves salty foods. Her psychiatrist has requested a comprehensive blood workup including CBC, glucose, triglycerides, and an A1c. An EKG is also requested.  She reports a sensation of a lump in her throat for about a week and a half, causing difficulty swallowing pills. The sensation is described as a knot that sometimes allows pills to get stuck, requiring her to eat bread to push them down. No choking on food is noted, but the issue is specific to pills. She recently saw an endocrinologist who found her thyroid  levels to be fine, though she feels they are close to hyperthyroid levels. She has not noticed any thyroid  nodules or goiter.  She is currently taking Klonopin  0.5 mg once daily around noon for anxiety but experiences increased  anxiety three to four hours after taking it. She is concerned about taking it at night due to potential blood pressure drops and is considering adjusting the timing or dosage to better manage her symptoms.  Socially, she has stopped drinking alcohol, which she believes has contributed to her lower blood pressure. She is dealing with work-related stress as her accommodations were denied, and she is on leave for three more months. She is working with a Therapist, sports and therapist, both of whom she feels are supportive and understanding of her history and current needs.        ROS All review of systems negative except what is listed in the HPI    Objective:     BP 96/68   Pulse 88   Ht 5' 3.5 (1.613 m)   Wt 221 lb (100.2 kg)   LMP 04/27/2024   SpO2 99%   BMI 38.53 kg/m    Physical Exam Vitals reviewed.  Constitutional:      Appearance: Normal appearance. She is obese.  Cardiovascular:     Rate and Rhythm: Normal rate and regular rhythm.     Heart sounds: Normal heart sounds.  Pulmonary:     Effort: Pulmonary effort is normal.     Breath sounds: Normal breath sounds.  Neurological:     Mental Status: She is alert and oriented to person, place, and time.  Psychiatric:        Mood and Affect: Mood normal.        Behavior: Behavior normal.  Thought Content: Thought content normal.        Judgment: Judgment normal.          Results for orders placed or performed in visit on 05/30/24  EKG 12-Lead  Result Value Ref Range   EKG 12 lead    = SR 79 bpm, QTc 389 msec    The 10-year ASCVD risk score (Arnett DK, et al., 2019) is: 4%    Assessment & Plan:   Problem List Items Addressed This Visit       Active Problems   Tachycardia   Relevant Orders   EKG 12-Lead (Completed)   Mixed hyperlipidemia   Relevant Medications   olmesartan  (BENICAR ) 5 MG tablet   Other Relevant Orders   Comprehensive metabolic panel with GFR   Primary hypertension    Relevant Medications   olmesartan  (BENICAR ) 5 MG tablet   Other Relevant Orders   CBC with Differential/Platelet   Comprehensive metabolic panel with GFR   Lipid panel   PTSD (post-traumatic stress disorder)   Other Visit Diagnoses       Globus sensation    -  Primary   Relevant Orders   Ambulatory referral to Gastroenterology   Ambulatory referral to ENT     Screening for diabetes mellitus       Relevant Orders   Hemoglobin A1c     Dysphagia, unspecified type       Relevant Orders   Ambulatory referral to Gastroenterology   Ambulatory referral to ENT     Folate deficiency       Relevant Orders   B12 and Folate Panel        Assessment & Plan Hypotension Hypotension likely due to medication and alcohol cessation. Seroquel and propranolol  contribute to low blood pressure. - Order CBC, glucose, triglycerides, and A1c since on Seroquel.  - Perform EKG to assess QTc intervals = stable - Decrease olmesartan  to 10 mg daily - keep upcoming follow-up with cardiology  Foreign body sensation in throat, difficulty swallowing Foreign body sensation for 1.5 weeks - Refer to GI or ENT for further evaluation, including possible swallow study or upper endoscopy. - Advise to go to the hospital if choking on food or if breathing is impaired.  Alcohol use disorder in remission Alcohol use disorder in remission. Cessation may contribute to hypotension. - Continue to abstain from alcohol.  Generalized anxiety disorder and post-traumatic stress disorder GAD and PTSD managed with Klonopin . Current regimen provides relief for 3-4 hours. Discussed dosing adjustment to manage anxiety without affecting nighttime blood pressure. - Try half tab of klonopin  twice a day to see if more stable, still avoid using with/near Seroquel for excessive drowsiness - Continue collaboration with psychiatrist for medication management and therapy.      Return in about 3 months (around 08/29/2024) for chronic  disease management.    Waddell KATHEE Mon, NP

## 2024-05-31 ENCOUNTER — Ambulatory Visit: Payer: Self-pay | Admitting: Family Medicine

## 2024-05-31 DIAGNOSIS — E782 Mixed hyperlipidemia: Secondary | ICD-10-CM

## 2024-05-31 DIAGNOSIS — R748 Abnormal levels of other serum enzymes: Secondary | ICD-10-CM

## 2024-06-11 ENCOUNTER — Other Ambulatory Visit: Payer: Self-pay

## 2024-06-11 ENCOUNTER — Other Ambulatory Visit (INDEPENDENT_AMBULATORY_CARE_PROVIDER_SITE_OTHER)

## 2024-06-11 DIAGNOSIS — E782 Mixed hyperlipidemia: Secondary | ICD-10-CM

## 2024-06-11 DIAGNOSIS — R748 Abnormal levels of other serum enzymes: Secondary | ICD-10-CM

## 2024-06-11 DIAGNOSIS — I10 Essential (primary) hypertension: Secondary | ICD-10-CM

## 2024-06-11 LAB — COMPREHENSIVE METABOLIC PANEL WITH GFR
ALT: 33 U/L (ref 0–35)
AST: 24 U/L (ref 0–37)
Albumin: 4.2 g/dL (ref 3.5–5.2)
Alkaline Phosphatase: 51 U/L (ref 39–117)
BUN: 12 mg/dL (ref 6–23)
CO2: 25 meq/L (ref 19–32)
Calcium: 9.3 mg/dL (ref 8.4–10.5)
Chloride: 103 meq/L (ref 96–112)
Creatinine, Ser: 0.79 mg/dL (ref 0.40–1.20)
GFR: 90.68 mL/min (ref >60.00)
Glucose, Bld: 94 mg/dL (ref 70–99)
Potassium: 4.5 meq/L (ref 3.5–5.1)
Sodium: 134 meq/L — ABNORMAL LOW (ref 135–145)
Total Bilirubin: 1 mg/dL (ref 0.2–1.2)
Total Protein: 7.1 g/dL (ref 6.0–8.3)

## 2024-06-11 LAB — LIPID PANEL
Cholesterol: 256 mg/dL — ABNORMAL HIGH (ref 0–200)
HDL: 37.4 mg/dL — ABNORMAL LOW (ref >39.00)
LDL Cholesterol: 186 mg/dL — ABNORMAL HIGH (ref 0–99)
NonHDL: 218.7
Total CHOL/HDL Ratio: 7
Triglycerides: 165 mg/dL — ABNORMAL HIGH (ref 0.0–149.0)
VLDL: 33 mg/dL (ref 0.0–40.0)

## 2024-06-12 ENCOUNTER — Ambulatory Visit: Payer: Self-pay | Admitting: Family Medicine

## 2024-06-12 DIAGNOSIS — E782 Mixed hyperlipidemia: Secondary | ICD-10-CM

## 2024-06-12 MED ORDER — EZETIMIBE 10 MG PO TABS: 10.0000 mg | ORAL_TABLET | Freq: Every day | ORAL | 3 refills | Status: AC

## 2024-06-15 ENCOUNTER — Encounter (INDEPENDENT_AMBULATORY_CARE_PROVIDER_SITE_OTHER): Payer: Self-pay | Admitting: Otolaryngology

## 2024-06-15 ENCOUNTER — Ambulatory Visit (INDEPENDENT_AMBULATORY_CARE_PROVIDER_SITE_OTHER): Admitting: Otolaryngology

## 2024-06-15 ENCOUNTER — Other Ambulatory Visit (HOSPITAL_COMMUNITY): Payer: Self-pay | Admitting: *Deleted

## 2024-06-15 ENCOUNTER — Telehealth (HOSPITAL_COMMUNITY): Payer: Self-pay | Admitting: *Deleted

## 2024-06-15 VITALS — BP 104/68 | Temp 99.0°F | Ht 63.5 in | Wt 216.0 lb

## 2024-06-15 DIAGNOSIS — R09A2 Foreign body sensation, throat: Secondary | ICD-10-CM

## 2024-06-15 DIAGNOSIS — K219 Gastro-esophageal reflux disease without esophagitis: Secondary | ICD-10-CM | POA: Diagnosis not present

## 2024-06-15 DIAGNOSIS — R1310 Dysphagia, unspecified: Secondary | ICD-10-CM

## 2024-06-15 MED ORDER — FAMOTIDINE 20 MG PO TABS: 20.0000 mg | ORAL_TABLET | Freq: Two times a day (BID) | ORAL | 3 refills | Status: DC

## 2024-06-15 NOTE — Patient Instructions (Signed)

## 2024-06-15 NOTE — Progress Notes (Signed)
 ENT CONSULT:  Reason for Consult: dysphagia   HPI: Discussed the use of AI scribe software for clinical note transcription with the patient, who gave verbal consent to proceed.  History of Present Illness Cassidy [name] is a 43 year old female who presents with difficulty swallowing pills.  For the past month, she has experienced pills getting stuck on the left side of her throat. No difficulty with food or liquids, but she needs to chew her food more thoroughly. She often tilts her head to the right to swallow effectively, especially when sitting in her recliner at night, as swallowing with her throat straight feels 'naughty'.  She has a history of heartburn and reflux and was previously taking Protonix , although she has not taken it recently due to changes in her medication regimen. She finds it challenging to manage her thyroid  medication due to the need to space it out with other medications.  She sometimes experiences a sensation of her throat collapsing on the left side when she has nasal drainage. Occasionally, when pills get stuck, they become lodged in her nasal cavity, which is uncomfortable.  Current medications include Protonix , which she may need to take at night, and she is also managing her thyroid  medication, though specific details on the dose and frequency were not discussed. She is also managing her thyroid  medication, though specific details on the dose and frequency were not discussed.  Throat discomfort when swallowing with nasal drainage.   Records Reviewed:  Visit with PCP Waddell Mon NP 05/30/24 She reports a sensation of a lump in her throat for about a week and a half, causing difficulty swallowing pills. The sensation is described as a knot that sometimes allows pills to get stuck, requiring her to eat bread to push them down. No choking on food is noted, but the issue is specific to pills. She recently saw an endocrinologist who found her thyroid  levels to be fine,  though she feels they are close to hyperthyroid levels. She has not noticed any thyroid  nodules or goiter.      Past Medical History:  Diagnosis Date   Acquired hypothyroidism    endocrinologist-- Carmelita Clover NP ([facility] [place])   Antiphospholipid antibody syndrome [facility])    hematology/ oncology--- dr timmy;  dx age 9 work-up done due to still birth (umbilical cord w/ clot)   Endometrial polyp    GERD (gastroesophageal reflux disease)    Irregular menses    Migraines    Mixed hyperlipidemia    PAT (paroxysmal atrial tachycardia) [facility])    cardiologist--- dr marla. tobb;  long hx takes flecainide  and propranolol ;   event monitor 11-04-2020 symptomatic occasional PAC SVT likely PAT;  echo 10-29-2020 ef 6-65%   Postcoital bleeding    PSVT (paroxysmal supraventricular tachycardia) (HCC)    Restless leg syndrome    Uterine leiomyoma    Vertigo    Wears glasses     Past Surgical History:  Procedure Laterality Date   COLONOSCOPY WITH ESOPHAGOGASTRODUODENOSCOPY (EGD)  2021   DILATATION & CURETTAGE/HYSTEROSCOPY WITH MYOSURE N/A 01/15/2022   Procedure: DILATATION & CURETTAGE/HYSTEROSCOPY WITH MYOSURE;  Surgeon: Lavoie, Marie-Lyne, MD;  Location: [facility];  Service: Gynecology;  Laterality: N/A;   HYSTEROSCOPY WITH NOVASURE N/A 01/15/2022   Procedure: HYSTEROSCOPY WITH NOVASURE;  Surgeon: Lavoie, Marie-Lyne, MD;  Location: [facility] [place];  Service: Gynecology;  Laterality: N/A;   TONSILLECTOMY     child    Family History  Problem Relation Age of Onset  Breast cancer Maternal Grandmother    Hyperlipidemia Father    Hyperlipidemia Mother    Supraventricular tachycardia Mother    Atrial fibrillation Mother    Autoimmune disease Mother    Healthy Brother    Breast cancer Maternal Aunt     Social History:  reports that she has been smoking cigarettes and e-cigarettes. She has a 56 pack-year smoking history. She uses smokeless tobacco. She reports current  alcohol use of about 7.0 - 14.0 standard drinks of alcohol per week. She reports that she does not currently use drugs.  Allergies:  Allergies  Allergen Reactions   Statins     Muscle aches   Benadryl  [Diphenhydramine ] Other (See Comments)    restless leg, so I need gabapentin  with it   Ciprofloxacin     Flu-like symptoms with muscle aches   Nsaids Hives    PER PT THIS INCLUDES ALL TYPES OF NSAIDS THAT CAUSE HIVES   Other     Per pt paprika causes tachycardia   Tolmetin Hives   Aspirin Hives    Medications: I have reviewed the patient's current medications.  The PMH, PSH, Medications, Allergies, and SH were reviewed and updated.  ROS: Constitutional: Negative for fever, weight loss and weight gain. Cardiovascular: Negative for chest pain and dyspnea on exertion. Respiratory: Is not experiencing shortness of breath at rest. Gastrointestinal: Negative for nausea and vomiting. Neurological: Negative for headaches. Psychiatric: The patient is not nervous/anxious  Blood pressure 104/68, temperature 99 F (37.2 C), height 5' 3.5 (1.613 m), weight 216 lb (98 kg), SpO2 95%. Body mass index is 37.66 kg/m.  PHYSICAL EXAM:  Exam: General: Well-developed, well-nourished Respiratory Respiratory effort: Equal inspiration and expiration without stridor Cardiovascular Peripheral Vascular: Warm extremities with equal color/perfusion Eyes: No nystagmus with equal extraocular motion bilaterally Neuro/Psych/Balance: Patient oriented to person, place, and time; Appropriate mood and affect; Gait is intact with no imbalance; Cranial nerves I-XII are intact Head and Face Inspection: Normocephalic and atraumatic without mass or lesion Palpation: Facial skeleton intact without bony stepoffs Salivary Glands: No mass or tenderness Facial Strength: Facial motility symmetric and full bilaterally ENT Pinna: External ear intact and fully developed External canal: Canal is patent with intact  skin Tympanic Membrane: Clear and mobile External Nose: No scar or anatomic deformity Internal Nose: Septum is straight. No polyp, or purulence. Mucosal edema and erythema present.  Bilateral inferior turbinate hypertrophy.  Lips, Teeth, and gums: Mucosa and teeth intact and viable TMJ: No pain to palpation with full mobility Oral cavity/oropharynx: No erythema or exudate, no lesions present Nasopharynx: No mass or lesion with intact mucosa Hypopharynx: Intact mucosa without pooling of secretions Larynx Glottic: Full true vocal cord mobility without lesion or mass Supraglottic: Normal appearing epiglottis and AE folds Interarytenoid Space: Moderate pachydermia&edema Subglottic Space: Patent without lesion or edema Neck Neck and Trachea: Midline trachea without mass or lesion Thyroid : No mass or nodularity Lymphatics: No lymphadenopathy  Procedure: Preoperative diagnosis: dysphagia   Postoperative diagnosis:   Same + GERD LPR  Procedure: Flexible fiberoptic laryngoscopy  Surgeon: Elena Larry, MD  Anesthesia: Topical lidocaine  and Afrin Complications: None Condition is stable throughout exam  Indications and consent:  The patient presents to the clinic with above symptoms. Indirect laryngoscopy view was incomplete. Thus it was recommended that they undergo a flexible fiberoptic laryngoscopy. All of the risks, benefits, and potential complications were reviewed with the patient preoperatively and verbal informed consent was obtained.  Procedure: The patient was seated upright in the clinic.  Topical lidocaine  and Afrin were applied to the nasal cavity. After adequate anesthesia had occurred, I then proceeded to pass the flexible telescope into the nasal cavity. The nasal cavity was patent without rhinorrhea or polyp. The nasopharynx was also patent without mass or lesion. The base of tongue was visualized and was normal. There were no signs of pooling of secretions in the piriform  sinuses. The true vocal folds were mobile bilaterally. There were no signs of glottic or supraglottic mucosal lesion or mass. There was moderate interarytenoid pachydermia and post cricoid edema. The telescope was then slowly withdrawn and the patient tolerated the procedure throughout.      Studies Reviewed: 10/10/23 CXR FINDINGS: The heart size and mediastinal contours are within normal limits. Both lungs are clear. The visualized skeletal structures are unremarkable.   IMPRESSION: No active cardiopulmonary disease.  Assessment/Plan: Encounter Diagnoses  Name Primary?   Dysphagia, unspecified type Yes   Chronic GERD    Globus sensation     Assessment and Plan Assessment & Plan Chronic pill dysphagia  Pills getting stuck on the left side. Possible GERD association (noted on scope exam). - Order swallow study to evaluate   Gastroesophageal reflux disease (GERD) GERD possibly contributing to dysphagia. Scope exam shows reflux changes. Protonix  previously prescribed but not currently listed. - continue Protonix  - Pepcid  20 mg BID  -  Reflux Gourmet after meals - diet and lifestyle changes to minimize GERD - Refer to [employer] blog for dietary and lifestyle modifications/reflux cook book   Thank you for allowing me to participate in the care of this patient. Please do not hesitate to contact me with any questions or concerns.   Elena Larry, MD Otolaryngology [facility] Phone: [phone] Fax: [phone]    06/15/2024, 10:21 AM

## 2024-06-15 NOTE — Telephone Encounter (Signed)
 Attempted to contact patient to schedule OP MBS. Left VM @ [phone] requesting a call back. RKEEL

## 2024-06-18 ENCOUNTER — Ambulatory Visit (HOSPITAL_COMMUNITY)
Admission: RE | Admit: 2024-06-18 | Discharge: 2024-06-18 | Disposition: A | Discharge status: Home / Self Care | Source: Ambulatory Visit | Attending: Family Medicine | Admitting: Family Medicine

## 2024-06-18 ENCOUNTER — Ambulatory Visit (HOSPITAL_COMMUNITY)
Admission: RE | Admit: 2024-06-18 | Discharge: 2024-06-18 | Disposition: A | Discharge status: Home / Self Care | Source: Ambulatory Visit | Attending: Otolaryngology | Admitting: Otolaryngology

## 2024-06-18 ENCOUNTER — Other Ambulatory Visit: Payer: Self-pay

## 2024-06-18 DIAGNOSIS — R1310 Dysphagia, unspecified: Secondary | ICD-10-CM

## 2024-06-18 DIAGNOSIS — R1314 Dysphagia, pharyngoesophageal phase: Secondary | ICD-10-CM | POA: Diagnosis not present

## 2024-06-18 DIAGNOSIS — K219 Gastro-esophageal reflux disease without esophagitis: Secondary | ICD-10-CM | POA: Diagnosis not present

## 2024-06-18 DIAGNOSIS — I10 Essential (primary) hypertension: Secondary | ICD-10-CM

## 2024-06-18 NOTE — Progress Notes (Signed)
 Modified Barium Swallow Study  Patient Details  Name: Cassidy [name] MRN: [id number] Date of Birth: Nov 15, 1980  Today's Date: 06/18/2024  Modified Barium Swallow completed.  Full report located under Chart Review in the Imaging Section.  History of Present Illness Pt is a 43 year old female arriving for an OP MBS due to pill dysphagia. Pt seen by ENT with finding of reflux changes on scope The true vocal folds were mobile bilaterally. There were no signs of glottic or supraglottic mucosal lesion or mass. There was moderate interarytenoid pachydermia and post cricoid edema.   Clinical Impression Pt exhibits mild pharyngoesophageal dysphagia including slightly decreased PES opening and trace backflow to pharynx from PES. Pt sensate to trace residue, exhibiting throat clearing and coughing despite no airway invasion. When swallowing a pill with thin liquids, pill did not significantly lodge; on immediate sweep it had already passed into the stomach, but pt reported globus. Suspect finding of inflammatory reflux inducaed changes on laryngoscopy would explain mild PES inflammation and hypersensitive reponse to very mild residue. Pt encouraged to follow prior recommendation from ENT and MD to address GERD. No further SLP f/u needed at this time. Factors that may increase risk of adverse event in presence of aspiration Noe & Lianne 2021):    Swallow Evaluation Recommendations Recommendations: PO diet PO Diet Recommendation: Regular;Thin liquids (Level 0) Liquid Administration via: Cup;Straw Medication Administration: Whole meds with liquid Supervision: Patient able to self-feed  Consuelo Fort, MA CCC-SLP  Acute Rehabilitation Services Secure Chat Preferred Office [phone]     [place] 06/18/2024,12:13 PM

## 2024-06-19 ENCOUNTER — Encounter (INDEPENDENT_AMBULATORY_CARE_PROVIDER_SITE_OTHER): Payer: Self-pay

## 2024-06-19 ENCOUNTER — Telehealth (INDEPENDENT_AMBULATORY_CARE_PROVIDER_SITE_OTHER): Payer: Self-pay

## 2024-06-19 NOTE — Telephone Encounter (Signed)
 Spoke with patient regarding results. Patient is concerned about the overall treatment. Patient has not picked up medications that were previously prescribed for reflux. She explains that she is concerned that they will react with the medications that she is already taking. She would like to know when she should take this medication along with the others. I explained to the patient that it would most likely be best to reach out to her pharmacist regarding this. Please advise .

## 2024-06-25 ENCOUNTER — Ambulatory Visit: Admitting: Family Medicine

## 2024-06-25 ENCOUNTER — Encounter: Payer: Self-pay | Admitting: Family Medicine

## 2024-06-25 ENCOUNTER — Ambulatory Visit (HOSPITAL_BASED_OUTPATIENT_CLINIC_OR_DEPARTMENT_OTHER)
Admission: RE | Admit: 2024-06-25 | Discharge: 2024-06-25 | Disposition: A | Discharge status: Home / Self Care | Source: Ambulatory Visit | Attending: Family Medicine | Admitting: Family Medicine

## 2024-06-25 VITALS — BP 105/61 | HR 90 | Ht 63.5 in | Wt 222.0 lb

## 2024-06-25 DIAGNOSIS — Z79899 Other long term (current) drug therapy: Secondary | ICD-10-CM

## 2024-06-25 DIAGNOSIS — R1032 Left lower quadrant pain: Secondary | ICD-10-CM

## 2024-06-25 DIAGNOSIS — F419 Anxiety disorder, unspecified: Secondary | ICD-10-CM

## 2024-06-25 LAB — POC URINALSYSI DIPSTICK (AUTOMATED)
Bilirubin, UA: NEGATIVE
Glucose, UA: NEGATIVE
Ketones, UA: NEGATIVE
Leukocytes, UA: NEGATIVE
Nitrite, UA: NEGATIVE
Protein, UA: NEGATIVE
Spec Grav, UA: <=1.005 — AB (ref 1.010–1.025)
Urobilinogen, UA: 0.2 U/dL
pH, UA: 5 (ref 5.0–8.0)

## 2024-06-25 MED ORDER — IOHEXOL 300 MG/ML  SOLN
80.0000 mL | Freq: Once | INTRAMUSCULAR | Status: AC | PRN
  Administered 2024-06-25: 80 mL via INTRAVENOUS

## 2024-06-25 NOTE — Progress Notes (Signed)
 Established Patient Office Visit  Subjective   Patient ID: VESPER TRANT, female    DOB: [birth year]  Age: 43 y.o. MRN: [id number]  Chief Complaint  Patient presents with   Medical Management of Chronic Issues    HPI    Discussed the use of AI scribe software for clinical note transcription with the patient, who gave verbal consent to proceed.  History of Present Illness Cassidy [name] is a 43 year old female with history of diverticulitis and IBS who presents with abdominal pain and bowel movement issues.   She has been experiencing severe abdominal pain for the past five days, which almost led her to visit the emergency room. The pain initially felt like something was lodged in her abdomen. She attempted to alleviate the discomfort by taking Dulcolax, resulting in a small bowel movement, but the sensation of something being lodged persisted. The pain is described as 'shocky, sharp, like gas almost pains' and is accompanied by some gas and bowel movements.  She switched to Miralax. After taking Miralax, she had a soft bowel movement, but the pain remains. No blood in stool, nausea, vomiting, or fevers. She felt unwell for three days following the severe pain, during which she did not engage in any activities and felt 'horrible'.  She associates her symptoms with diverticulitis, noting a similar experience in the past. She mentions a recent episode after eating fast food, which resulted in a painful bowel movement. Despite this, the pain persists, particularly on the left side, and she feels as though something is lodged and not moving. She has been consuming a plant-based protein shake with blueberries and oat milk for dinner, which she believes helps with her restless leg syndrome due to its potassium content. However, she is concerned that the fibrous seeds in the blueberries might be contributing to her current symptoms.  She reports significant pain when her bladder is full, which is  somewhat relieved upon urination. No blood in urine or burning sensation when urinating.   Her current medications include Klonopin , which she takes once daily, and she has ten pills left. She also takes Seroquel, which makes her very thirsty, leading her to drink 20-25 glasses of water a day. Her psych provider is out of state and unable to prescribe her Klonopin , but is agreeable to her being on it daily PRN.           ROS All review of systems negative except what is listed in the HPI    Objective:     BP 105/61   Pulse 90   Ht 5' 3.5 (1.613 m)   Wt 222 lb (100.7 kg)   LMP 06/14/2024 (Exact Date)   SpO2 100%   BMI 38.71 kg/m    Physical Exam Vitals reviewed.  Constitutional:      Appearance: Normal appearance.  Cardiovascular:     Rate and Rhythm: Normal rate and regular rhythm.  Pulmonary:     Effort: Pulmonary effort is normal.     Breath sounds: Normal breath sounds.  Abdominal:     Tenderness: There is abdominal tenderness in the left lower quadrant.  Skin:    General: Skin is warm and dry.  Neurological:     Mental Status: She is alert and oriented to person, place, and time.  Psychiatric:        Mood and Affect: Mood normal.        Behavior: Behavior normal.        Thought Content:  Thought content normal.        Judgment: Judgment normal.        Results for orders placed or performed in visit on 06/25/24  POCT Urinalysis Dipstick (Automated)  Result Value Ref Range   Color, UA yellow    Clarity, UA clear    Glucose, UA Negative Negative   Bilirubin, UA negative    Ketones, UA negative    Spec Grav, UA <=1.005 (A) 1.010 - 1.025   Blood, UA moderate    pH, UA 5.0 5.0 - 8.0   Protein, UA Negative Negative   Urobilinogen, UA 0.2 0.2 or 1.0 E.U./dL   Nitrite, UA negative    Leukocytes, UA Negative Negative      The 10-year ASCVD risk score (Arnett DK, et al., 2019) is: 6.9%    Assessment & Plan:   Problem List Items Addressed This  Visit       Active Problems   Anxiety   Taking Klonopin  0.5 mg daily, effective in public situations. Discussed situational use to avoid dependence, psychiatrist informed. - Continue Klonopin  0.5 mg once daily as needed for anxiety. - Encourage situational use of Klonopin  to prevent dependence. - Complete drug screen and controlled substance contract.      Other Visit Diagnoses       High risk medication use    -  Primary   Relevant Orders   DRUG MONITORING PANEL [phone] , URINE     Left lower quadrant abdominal pain     Abdominal pain with history of diverticulitis and irritable bowel syndrome Severe left-sided abdominal pain for five days, differential includes diverticulitis and constipation.  - Order CT scan of the abdomen to rule out diverticulitis. - Obtain urine sample for analysis - hematuria, but otherwise fine. Will send for culture and micro.  - Order CBC, CMP   Relevant Orders   CBC with Differential/Platelet   Comprehensive metabolic panel with GFR   CT ABDOMEN PELVIS W CONTRAST   POCT Urinalysis Dipstick (Automated) (Completed)   Urine Culture   Urine Microscopic Only           Return in about 3 months (around 09/24/2024) for chronic disease management/Klonopin  .    Waddell KATHEE Mon, NP

## 2024-06-25 NOTE — Assessment & Plan Note (Signed)
 Taking Klonopin  0.5 mg daily, effective in public situations. Discussed situational use to avoid dependence, psychiatrist informed. - Continue Klonopin  0.5 mg once daily as needed for anxiety. - Encourage situational use of Klonopin  to prevent dependence. - Complete drug screen and controlled substance contract.

## 2024-06-26 ENCOUNTER — Ambulatory Visit: Payer: Self-pay | Admitting: Family Medicine

## 2024-06-26 DIAGNOSIS — F411 Generalized anxiety disorder: Secondary | ICD-10-CM

## 2024-06-26 LAB — CBC WITH DIFFERENTIAL/PLATELET
Basophils Absolute: 0 K/uL (ref 0.0–0.1)
Basophils Relative: 0.5 % (ref 0.0–3.0)
Eosinophils Absolute: 0.3 K/uL (ref 0.0–0.7)
Eosinophils Relative: 3.4 % (ref 0.0–5.0)
HCT: 41.1 % (ref 36.0–46.0)
Hemoglobin: 13.9 g/dL (ref 12.0–15.0)
Lymphocytes Relative: 19.9 % (ref 12.0–46.0)
Lymphs Abs: 1.8 K/uL (ref 0.7–4.0)
MCHC: 33.8 g/dL (ref 30.0–36.0)
MCV: 92.2 fl (ref 78.0–100.0)
Monocytes Absolute: 0.6 K/uL (ref 0.1–1.0)
Monocytes Relative: 6.6 % (ref 3.0–12.0)
Neutro Abs: 6.4 K/uL (ref 1.4–7.7)
Neutrophils Relative %: 69.6 % (ref 43.0–77.0)
Platelets: 246 K/uL (ref 150.0–400.0)
RBC: 4.46 Mil/uL (ref 3.87–5.11)
RDW: 12.9 % (ref 11.5–15.5)
WBC: 9.2 K/uL (ref 4.0–10.5)

## 2024-06-26 LAB — URINALYSIS, MICROSCOPIC ONLY: RBC / HPF: NONE SEEN (ref 0–?)

## 2024-06-26 LAB — COMPREHENSIVE METABOLIC PANEL WITH GFR
ALT: 39 U/L — ABNORMAL HIGH (ref 0–35)
AST: 31 U/L (ref 0–37)
Albumin: 3.9 g/dL (ref 3.5–5.2)
Alkaline Phosphatase: 59 U/L (ref 39–117)
BUN: 10 mg/dL (ref 6–23)
CO2: 26 meq/L (ref 19–32)
Calcium: 9 mg/dL (ref 8.4–10.5)
Chloride: 104 meq/L (ref 96–112)
Creatinine, Ser: 0.75 mg/dL (ref 0.40–1.20)
GFR: 96.48 mL/min (ref >60.00)
Glucose, Bld: 102 mg/dL — ABNORMAL HIGH (ref 70–99)
Potassium: 3.9 meq/L (ref 3.5–5.1)
Sodium: 137 meq/L (ref 135–145)
Total Bilirubin: 0.6 mg/dL (ref 0.2–1.2)
Total Protein: 6.7 g/dL (ref 6.0–8.3)

## 2024-06-27 LAB — DRUG MONITORING PANEL 375977 , URINE

## 2024-06-27 LAB — DM TEMPLATE

## 2024-06-27 LAB — URINE CULTURE
MICRO NUMBER:: 17029613
Result:: NO GROWTH
SPECIMEN QUALITY:: ADEQUATE

## 2024-07-02 MED ORDER — CLONAZEPAM 0.5 MG PO TABS: ORAL_TABLET | ORAL | 0 refills | Status: DC

## 2024-07-02 NOTE — Telephone Encounter (Signed)
 Last written in August #30 with 1 refill. Please advise.

## 2024-07-08 ENCOUNTER — Other Ambulatory Visit: Payer: Self-pay | Admitting: Cardiology

## 2024-07-12 ENCOUNTER — Encounter: Payer: Self-pay | Admitting: Cardiology

## 2024-07-12 ENCOUNTER — Ambulatory Visit: Attending: Cardiology | Admitting: Cardiology

## 2024-07-12 VITALS — BP 102/78 | HR 93 | Ht 63.0 in | Wt 218.4 lb

## 2024-07-12 DIAGNOSIS — I10 Essential (primary) hypertension: Secondary | ICD-10-CM

## 2024-07-12 DIAGNOSIS — E782 Mixed hyperlipidemia: Secondary | ICD-10-CM

## 2024-07-12 DIAGNOSIS — I4719 Other supraventricular tachycardia: Secondary | ICD-10-CM | POA: Diagnosis not present

## 2024-07-12 MED ORDER — PROPRANOLOL HCL 10 MG PO TABS: 10.0000 mg | ORAL_TABLET | Freq: Two times a day (BID) | ORAL | 3 refills | Status: AC

## 2024-07-12 NOTE — Patient Instructions (Addendum)
 Medication Instructions:  Your physician has recommended you make the following change in your medication:  STOP: Olmesartan  DECREASE: Propranolol  10 mg twice daily  *If you need a refill on your cardiac medications before your next appointment, please call your pharmacy*  Lab Work: When coming for nurse visit: Lipids If you have labs (blood work) drawn today and your tests are completely normal, you will receive your results only by: MyChart Message (if you have MyChart) OR A paper copy in the mail If you have any lab test that is abnormal or we need to change your treatment, we will call you to review the results.  Follow-Up: At [facility], you and your health needs are our priority.  As part of our continuing mission to provide you with exceptional heart care, our providers are all part of one team.  This team includes your primary Cardiologist (physician) and Advanced Practice Providers or APPs (Physician Assistants and Nurse Practitioners) who all work together to provide you with the care you need, when you need it.  Your next appointment:   9 month(s)  Provider:   Kardie Tobb, DO    Other Instructions:  Will need nurse visit in 6-8 weeks, Reason: EKG

## 2024-07-12 NOTE — Progress Notes (Signed)
 Cardiology Office Note:    Date:  07/14/2024   ID:  Cassidy [name], DOB Aug 04, 1981, MRN [id number]  PCP:  Almarie Waddell NOVAK, NP  Cardiologist:  Dub Huntsman, DO  Electrophysiologist:  None   Referring MD: Almarie Waddell NOVAK, NP    I am doing better  History of Present Illness:    Cassidy [name] is a 43 y.o. female with a hx of antiphospholipid syndrome not on anticoagulation, PVCs, history of SVT and hyperlipidemia is here today for follow-up visit   Since her last visit she tells me she has stopped drinking a lot of alcohol she is really much better she was very appreciative of the care that Dr. Ubaldo and I was able to coordinate.  She notes that her psychiatrist wants her to get started on Seroquel.   She is here with her significant other.  Past Medical History:  Diagnosis Date   Acquired hypothyroidism    endocrinologist-- Carmelita Clover NP ([facility]-- [place])   Antiphospholipid antibody syndrome    hematology/ oncology--- dr timmy;  dx age 83 work-up done due to still birth (umbilical cord w/ clot)   Endometrial polyp    GERD (gastroesophageal reflux disease)    Irregular menses    Migraines    Mixed hyperlipidemia    PAT (paroxysmal atrial tachycardia)    cardiologist--- dr marla. Senora Lacson;  long hx takes flecainide  and propranolol ;   event monitor 11-04-2020 symptomatic occasional PAC SVT likely PAT;  echo 10-29-2020 ef 6-65%   Postcoital bleeding    PSVT (paroxysmal supraventricular tachycardia)    Restless leg syndrome    Uterine leiomyoma    Vertigo    Wears glasses     Past Surgical History:  Procedure Laterality Date   COLONOSCOPY WITH ESOPHAGOGASTRODUODENOSCOPY (EGD)  2021   DILATATION & CURETTAGE/HYSTEROSCOPY WITH MYOSURE N/A 01/15/2022   Procedure: DILATATION & CURETTAGE/HYSTEROSCOPY WITH MYOSURE;  Surgeon: Lavoie, Marie-Lyne, MD;  Location: [facility];  Service: Gynecology;  Laterality: N/A;   HYSTEROSCOPY WITH NOVASURE N/A 01/15/2022   Procedure:  HYSTEROSCOPY WITH NOVASURE;  Surgeon: Lavoie, Marie-Lyne, MD;  Location: [facility];  Service: Gynecology;  Laterality: N/A;   TONSILLECTOMY     child    Current Medications: Current Meds  Medication Sig   clonazePAM  (KLONOPIN ) 0.5 MG tablet Take 0.25- 0.5 mg by mouth twice daily as needed for anxiety   ezetimibe  (ZETIA ) 10 MG tablet Take 1 tablet (10 mg total) by mouth daily.   famotidine  (PEPCID ) 20 MG tablet Take 20 mg by mouth 2 (two) times daily. (Patient taking differently: Take 20 mg by mouth at bedtime.)   levothyroxine (SYNTHROID) 200 MCG tablet Take 200 mcg by mouth daily before breakfast.   polyethylene glycol (MIRALAX / GLYCOLAX) 17 g packet Take 17 g by mouth daily.   propranolol  (INDERAL ) 10 MG tablet Take 1 tablet (10 mg total) by mouth 2 (two) times daily.   QUEtiapine (SEROQUEL XR) 200 MG 24 hr tablet Take 200 mg by mouth at bedtime.   [DISCONTINUED] olmesartan  (BENICAR ) 5 MG tablet Take 2 tablets (10 mg total) by mouth daily. (Patient taking differently: Take 5 mg by mouth daily.)   [DISCONTINUED] propranolol  (INDERAL ) 10 MG tablet Take 1 tablet (10 mg total) by mouth 3 (three) times daily. (Patient taking differently: Take 10 mg by mouth 2 (two) times daily.)     Allergies:   Statins, Benadryl  [diphenhydramine ], Ciprofloxacin, Nsaids, Other, Tolmetin, and Aspirin   Social History   Socioeconomic  History   Marital status: Married    Spouse name: Dorothyann   Number of children: Not on file   Years of education: Not on file   Highest education level: 12th grade  Occupational History   Not on file  Tobacco Use   Smoking status: Every Day    Current packs/day: 2.00    Average packs/day: 2.0 packs/day for 28.0 years (56.0 ttl pk-yrs)    Types: Cigarettes, E-cigarettes   Smokeless tobacco: Current   Tobacco comments:    01-13-2022  per pt smokes 5 cig per day and uses nicotine pouch's patient also vapes    02/23/2023 Patient smokes about a pack a  week and uses a nicotine pouch  Vaping Use   Vaping status: Former   Devices: quit 2022  Substance and Sexual Activity   Alcohol use: Yes    Alcohol/week: 7.0 - 14.0 standard drinks of alcohol    Types: 7 - 14 Standard drinks or equivalent per week   Drug use: Not Currently    Comment: Did use CBD, but stopped   Sexual activity: Yes    Partners: Female    Birth control/protection: None    Comment: not in past 2 weeks  Other Topics Concern   Not on file  Social History Narrative   Not on file   Social Drivers of Health   Financial Resource Strain: Low Risk  (04/29/2024)   Overall Financial Resource Strain (CARDIA)    Difficulty of Paying Living Expenses: Not very hard  Food Insecurity: Food Insecurity Present (04/29/2024)   Hunger Vital Sign    Worried About Running Out of Food in the Last Year: Sometimes true    Ran Out of Food in the Last Year: Never true  Transportation Needs: No Transportation Needs (04/29/2024)   PRAPARE - Administrator, Civil Service (Medical): No    Lack of Transportation (Non-Medical): No  Physical Activity: Inactive (04/29/2024)   Exercise Vital Sign    Days of Exercise per Week: 0 days    Minutes of Exercise per Session: Not on file  Stress: Stress Concern Present (04/29/2024)   [employer] of Occupational Health - Occupational Stress Questionnaire    Feeling of Stress: Very much  Social Connections: Moderately Isolated (04/29/2024)   Social Connection and Isolation Panel    Frequency of Communication with Friends and Family: More than three times a week    Frequency of Social Gatherings with Friends and Family: Patient declined    Attends Religious Services: Never    Database administrator or Organizations: No    Attends Engineer, structural: Not on file    Marital Status: Married     Family History: The patient's family history includes Atrial fibrillation in her mother; Autoimmune disease in her mother; Breast cancer in her  maternal aunt and maternal grandmother; Healthy in her brother; Hyperlipidemia in her father and mother; Supraventricular tachycardia in her mother.  ROS:   Review of Systems  Constitution: Negative for decreased appetite, fever and weight gain.  HENT: Negative for congestion, ear discharge, hoarse voice and sore throat.   Eyes: Negative for discharge, redness, vision loss in right eye and visual halos.  Cardiovascular: Negative for chest pain, dyspnea on exertion, leg swelling, orthopnea and palpitations.  Respiratory: Negative for cough, hemoptysis, shortness of breath and snoring.   Endocrine: Negative for heat intolerance and polyphagia.  Hematologic/Lymphatic: Negative for bleeding problem. Does not bruise/bleed easily.  Skin: Negative for flushing, nail  changes, rash and suspicious lesions.  Musculoskeletal: Negative for arthritis, joint pain, muscle cramps, myalgias, neck pain and stiffness.  Gastrointestinal: Negative for abdominal pain, bowel incontinence, diarrhea and excessive appetite.  Genitourinary: Negative for decreased libido, genital sores and incomplete emptying.  Neurological: Negative for brief paralysis, focal weakness, headaches and loss of balance.  Psychiatric/Behavioral: Negative for altered mental status, depression and suicidal ideas.  Allergic/Immunologic: Negative for HIV exposure and persistent infections.    EKGs/Labs/Other Studies Reviewed:    The following studies were reviewed today:   EKG:  The ekg ordered today demonstrates   Recent Labs: 08/08/2023: Magnesium  2.0 03/07/2024: TSH 1.56 06/25/2024: ALT 39; BUN 10; Creatinine, Ser 0.75; Hemoglobin 13.9; Platelets 246.0; Potassium 3.9; Sodium 137  Recent Lipid Panel    Component Value Date/Time   CHOL 256 (H) 06/11/2024 1046   CHOL 247 (H) 08/08/2023 1036   TRIG 165.0 (H) 06/11/2024 1046   HDL 37.40 (L) 06/11/2024 1046   HDL 51 08/08/2023 1036   CHOLHDL 7 06/11/2024 1046   VLDL 33.0 06/11/2024  1046   LDLCALC 186 (H) 06/11/2024 1046   LDLCALC 155 (H) 08/08/2023 1036    Physical Exam:    VS:  BP 102/78 (BP Location: Left Arm, Patient Position: Sitting, Cuff Size: Large)   Pulse 93   Ht 5' 3 (1.6 m)   Wt 218 lb 6.4 oz (99.1 kg)   LMP 06/14/2024 (Exact Date)   SpO2 97%   BMI 38.69 kg/m     Wt Readings from Last 3 Encounters:  07/12/24 218 lb 6.4 oz (99.1 kg)  06/25/24 222 lb (100.7 kg)  06/15/24 216 lb (98 kg)     GEN: Well nourished, well developed in no acute distress HEENT: Normal NECK: No JVD; No carotid bruits LYMPHATICS: No lymphadenopathy CARDIAC: S1S2 noted,RRR, no murmurs, rubs, gallops RESPIRATORY:  Clear to auscultation without rales, wheezing or rhonchi  ABDOMEN: Soft, non-tender, non-distended, +bowel sounds, no guarding. EXTREMITIES: No edema, No cyanosis, no clubbing MUSCULOSKELETAL:  No deformity  SKIN: Warm and dry NEUROLOGIC:  Alert and oriented x 3, non-focal PSYCHIATRIC:  Normal affect, good insight  ASSESSMENT:    1. Primary hypertension   2. PAT (paroxysmal atrial tachycardia)   3. Mixed hyperlipidemia    PLAN:     He blood pressure is on the lower sider, at times hypotensive at home. I will set up a nurse visit, will repeat her lipid profile.  Olmesartan  and cut propranolol  twice a day  Paroxysmal SVT - Flecainide  discontinued  at her previous visit due to interaction with Seroquel. Propranolol  adjusted today    Hyperlipidemia -  She does have intolerance to statin. She will benefit from PCSK 9 inhibitors.     The patient is in agreement with the above plan. The patient left the office in stable condition.  The patient will follow up in   Medication Adjustments/Labs and Tests Ordered: Current medicines are reviewed at length with the patient today.  Concerns regarding medicines are outlined above.  Orders Placed This Encounter  Procedures   Lipid panel   EKG 12-Lead   Meds ordered this encounter  Medications   propranolol   (INDERAL ) 10 MG tablet    Sig: Take 1 tablet (10 mg total) by mouth 2 (two) times daily.    Dispense:  180 tablet    Refill:  3    Patient Instructions  Medication Instructions:  Your physician has recommended you make the following change in your medication:  STOP: Olmesartan  DECREASE: Propranolol   10 mg twice daily  *If you need a refill on your cardiac medications before your next appointment, please call your pharmacy*  Lab Work: When coming for nurse visit: Lipids If you have labs (blood work) drawn today and your tests are completely normal, you will receive your results only by: MyChart Message (if you have MyChart) OR A paper copy in the mail If you have any lab test that is abnormal or we need to change your treatment, we will call you to review the results.  Follow-Up: At [facility], you and your health needs are our priority.  As part of our continuing mission to provide you with exceptional heart care, our providers are all part of one team.  This team includes your primary Cardiologist (physician) and Advanced Practice Providers or APPs (Physician Assistants and Nurse Practitioners) who all work together to provide you with the care you need, when you need it.  Your next appointment:   9 month(s)  Provider:   Jenniah Bhavsar, DO    Other Instructions:  Will need nurse visit in 6-8 weeks, Reason: EKG       Adopting a Healthy Lifestyle.  Know what a healthy weight is for you (roughly BMI <25) and aim to maintain this   Aim for 7+ servings of fruits and vegetables daily   65-80+ fluid ounces of water or unsweet tea for healthy kidneys   Limit to max 1 drink of alcohol per day; avoid smoking/tobacco   Limit animal fats in diet for cholesterol and heart health - choose grass fed whenever available   Avoid highly processed foods, and foods high in saturated/trans fats   Aim for low stress - take time to unwind and care for your mental health   Aim for  150 min of moderate intensity exercise weekly for heart health, and weights twice weekly for bone health   Aim for 7-9 hours of sleep daily   When it comes to diets, agreement about the perfect plan isnt easy to find, even among the experts. Experts at the [facility] [employer] developed an idea known as the Healthy Eating Plate. Just imagine a plate divided into logical, healthy portions.   The emphasis is on diet quality:   Load up on vegetables and fruits - one-half of your plate: Aim for color and variety, and remember that potatoes dont count.   Go for whole grains - one-quarter of your plate: Whole wheat, barley, wheat berries, quinoa, oats, brown rice, and foods made with them. If you want pasta, go with whole wheat pasta.   Protein power - one-quarter of your plate: Fish, chicken, beans, and nuts are all healthy, versatile protein sources. Limit red meat.   The diet, however, does go beyond the plate, offering a few other suggestions.   Use healthy plant oils, such as olive, canola, soy, corn, sunflower and peanut. Check the labels, and avoid partially hydrogenated oil, which have unhealthy trans fats.   If youre thirsty, drink water. Coffee and tea are good in moderation, but skip sugary drinks and limit milk and dairy products to one or two daily servings.   The type of carbohydrate in the diet is more important than the amount. Some sources of carbohydrates, such as vegetables, fruits, whole grains, and beans-are healthier than others.   Finally, stay active  Signed, Dub Huntsman, DO  07/14/2024 10:16 PM    [facility]

## 2024-08-03 ENCOUNTER — Other Ambulatory Visit (INDEPENDENT_AMBULATORY_CARE_PROVIDER_SITE_OTHER): Payer: Self-pay | Admitting: Otolaryngology

## 2024-08-10 ENCOUNTER — Other Ambulatory Visit: Payer: Self-pay | Admitting: Family Medicine

## 2024-08-10 DIAGNOSIS — F411 Generalized anxiety disorder: Secondary | ICD-10-CM

## 2024-08-10 NOTE — Telephone Encounter (Signed)
 Requesting: Clonazepam  Contract: none scanned UDS: 06/25/2024 Last Visit: 06/25/2024 Next Visit: not scheduled Last Refill: 07/02/2024 #30 with no refills  Please Advise

## 2024-09-01 ENCOUNTER — Ambulatory Visit
Admission: RE | Admit: 2024-09-01 | Discharge: 2024-09-01 | Disposition: A | Discharge status: Home / Self Care | Source: Ambulatory Visit | Attending: Nurse Practitioner

## 2024-09-01 VITALS — BP 130/85 | HR 92 | Temp 98.1°F | Resp 17

## 2024-09-01 DIAGNOSIS — R002 Palpitations: Secondary | ICD-10-CM

## 2024-09-01 NOTE — ED Provider Notes (Signed)
 [facility]    CSN: [id number] Arrival date & time: 09/01/24  1328      History   Chief Complaint Chief Complaint  Patient presents with   Medication Reaction    Having heart pounding and fast resting heart beat on 300mg  seroquel xr. Have nurse visit on Monday for a follow up on the increase in medicine from 200mg  to 300 mg to check qt intervals. Started having symptoms a few days ago but now getting worse - Entered by patient    HPI Cassidy [name] is a 43 y.o. female.   Discussed the use of AI scribe software for clinical note transcription with the patient, who gave verbal consent to proceed.   The patient presents with complaints of palpitations described as a sensation of the heart pounding that began during the middle of the night and were accompanied by breathing difficulties. She reports waking from sleep with a sudden awareness of her heartbeat, described as boom, boom, boom, boom, similar to the feeling of being startled awake. Along with the palpitations, she experienced a sensation of needing to take deep breaths despite actively breathing, creating a subjective feeling that she was not getting enough air. These symptoms prompted today's visit due to concern for a possible cardiac event, especially given recent medication changes and ongoing QT interval monitoring.  She reports that her Seroquel dose was increased from 200 mg to 300 mg approximately five weeks ago following cardiology clearance with QT evaluation on October 16. At that same cardiology visit, olmesartan  was discontinued due to episodes of very low blood pressure, and propranolol  was reduced from three times daily to twice daily. She has been taking the increased Seroquel dose without issue for over five weeks and has a nurse visit scheduled for Monday for a follow-up EKG and lab work, including triglycerides.  The patient recently returned from a one-week trip to [place]  where her salt intake was  significantly higher than usual. Since returning, she has noticed bilateral ankle swelling that improves overnight and believes this may be related to dietary sodium. She also reports that her Sleep Number bed tracking has shown elevated resting heart rates ranging from 85-90 beats per minute over the past three days, which is higher than her typical baseline.  Current medications include clonazepam  taken as needed, not daily. She consumes one Red Bull each morning to counteract sedative effects from Seroquel and otherwise drinks water throughout the day. She reports smoking reduction from approximately one pack per day to about a quarter pack per day. She denies any recent increase in caffeine use beyond the daily energy drink, denies chocolate overuse, diet medications, or illicit substance use including marijuana or cocaine. Alcohol intake is limited to no more than one drink on occasions due to medication interactions.  The following sections of the patient's history were reviewed and updated as appropriate: allergies, current medications, past family history, past medical history, past social history, past surgical history, and problem list.        Past Medical History:  Diagnosis Date   Acquired hypothyroidism    endocrinologist-- Carmelita Clover NP ([facility]-- [place])   Antiphospholipid antibody syndrome    hematology/ oncology--- dr timmy;  dx age 43 work-up done due to still birth (umbilical cord w/ clot)   Endometrial polyp    GERD (gastroesophageal reflux disease)    Irregular menses    Migraines    Mixed hyperlipidemia    PAT (paroxysmal atrial tachycardia)    cardiologist---  dr marla. tobb;  long hx takes flecainide  and propranolol ;   event monitor 11-04-2020 symptomatic occasional PAC SVT likely PAT;  echo 10-29-2020 ef 6-65%   Postcoital bleeding    PSVT (paroxysmal supraventricular tachycardia)    Restless leg syndrome    Uterine leiomyoma    Vertigo    Wears glasses      Patient Active Problem List   Diagnosis Date Noted   PTSD (post-traumatic stress disorder) 04/03/2024   Anxiety 02/03/2023   Primary hypertension 02/03/2023   Atrial fibrillation (HCC)    PAT (paroxysmal atrial tachycardia)    Tachycardia    Restless leg syndrome    Morbid obesity (HCC)    Mixed hyperlipidemia    GERD (gastroesophageal reflux disease)    Autoimmune disorder    Antiphospholipid antibody syndrome    Hypothyroidism 09/03/2019    Past Surgical History:  Procedure Laterality Date   COLONOSCOPY WITH ESOPHAGOGASTRODUODENOSCOPY (EGD)  2021   DILATATION & CURETTAGE/HYSTEROSCOPY WITH MYOSURE N/A 01/15/2022   Procedure: DILATATION & CURETTAGE/HYSTEROSCOPY WITH MYOSURE;  Surgeon: Lavoie, Marie-Lyne, MD;  Location: [facility];  Service: Gynecology;  Laterality: N/A;   HYSTEROSCOPY WITH NOVASURE N/A 01/15/2022   Procedure: HYSTEROSCOPY WITH NOVASURE;  Surgeon: Lavoie, Marie-Lyne, MD;  Location: [facility];  Service: Gynecology;  Laterality: N/A;   TONSILLECTOMY     child    OB History     Gravida  1   Para      Term      Preterm      AB      Living  0      SAB      IAB      Ectopic      Multiple      Live Births  0            Home Medications    Prior to Admission medications   Medication Sig Start Date End Date Taking? Authorizing Provider  pantoprazole  (PROTONIX ) 40 MG tablet Take 40 mg by mouth daily. 07/30/24  Yes [provider]  QUEtiapine (SEROQUEL) 300 MG tablet Take 300 mg by mouth at bedtime.   Yes [provider]  clonazePAM  (KLONOPIN ) 0.5 MG tablet TAKE 1/2 TO 1 TABLET 2 TIMES A DAY AS NEEDED FOR ANXIETY 08/13/24   Almarie Waddell NOVAK, NP  ezetimibe  (ZETIA ) 10 MG tablet Take 1 tablet (10 mg total) by mouth daily. 06/12/24   Almarie Waddell NOVAK, NP  famotidine  (PEPCID ) 20 MG tablet TAKE 1 TABLET BY MOUTH 2 TIMES A DAY Patient not taking: Reported on 09/01/2024 08/03/24   Hedges, Reyes, PA-C   levothyroxine (SYNTHROID) 200 MCG tablet Take 200 mcg by mouth daily before breakfast.    [provider]  polyethylene glycol (MIRALAX / GLYCOLAX) 17 g packet Take 17 g by mouth daily.    [provider]  propranolol  (INDERAL ) 10 MG tablet Take 1 tablet (10 mg total) by mouth 2 (two) times daily. 07/12/24   Tobb, Kardie, DO  QUEtiapine (SEROQUEL XR) 200 MG 24 hr tablet Take 300 mg by mouth at bedtime. Patient not taking: Reported on 09/01/2024    [provider]    Family History Family History  Problem Relation Age of Onset   Breast cancer Maternal Grandmother    Hyperlipidemia Father    Hyperlipidemia Mother    Supraventricular tachycardia Mother    Atrial fibrillation Mother    Autoimmune disease Mother    Healthy Brother    Breast  cancer Maternal Aunt     Social History Social History   Tobacco Use   Smoking status: Every Day    Current packs/day: 2.00    Average packs/day: 2.0 packs/day for 28.0 years (56.0 ttl pk-yrs)    Types: Cigarettes, E-cigarettes   Smokeless tobacco: Current   Tobacco comments:    01-13-2022  per pt smokes 5 cig per day and uses nicotine pouch's patient also vapes    02/23/2023 Patient smokes about a pack a week and uses a nicotine pouch  Vaping Use   Vaping status: Former   Devices: quit 2022  Substance Use Topics   Alcohol use: Yes    Alcohol/week: 7.0 - 14.0 standard drinks of alcohol    Types: 7 - 14 Standard drinks or equivalent per week   Drug use: Not Currently    Comment: Did use CBD, but stopped     Allergies   Statins, Benadryl  [diphenhydramine ], Ciprofloxacin, Nsaids, Other, Tolmetin, and Aspirin   Review of Systems Review of Systems  Eyes:  Negative for visual disturbance.  Respiratory:  Positive for shortness of breath. Negative for chest tightness.   Cardiovascular:  Positive for palpitations and leg swelling. Negative for chest pain.  Gastrointestinal:  Negative for nausea and vomiting.   Neurological:  Negative for dizziness, weakness, numbness and headaches.  All other systems reviewed and are negative.    Physical Exam Triage Vital Signs ED Triage Vitals [09/01/24 1340]  Encounter Vitals Group     BP 130/85     Girls Systolic BP Percentile      Girls Diastolic BP Percentile      Boys Systolic BP Percentile      Boys Diastolic BP Percentile      Pulse Rate 92     Resp 17     Temp 98.1 F (36.7 C)     Temp Source Oral     SpO2 97 %     Weight      Height      Head Circumference      Peak Flow      Pain Score      Pain Loc      Pain Education      Exclude from Growth Chart    No data found.  Updated Vital Signs BP 130/85 (BP Location: Right Arm)   Pulse 92   Temp 98.1 F (36.7 C) (Oral)   Resp 17   SpO2 97%   Visual Acuity Right Eye Distance:   Left Eye Distance:   Bilateral Distance:    Right Eye Near:   Left Eye Near:    Bilateral Near:     Physical Exam Vitals reviewed.  Constitutional:      General: She is awake. She is not in acute distress.    Appearance: Normal appearance. She is well-developed. She is obese. She is not ill-appearing, toxic-appearing or diaphoretic.  HENT:     Head: Normocephalic.     Right Ear: Hearing normal.     Left Ear: Hearing normal.     Nose: Nose normal.     Mouth/Throat:     Mouth: Mucous membranes are moist.  Eyes:     General: Vision grossly intact.     Conjunctiva/sclera: Conjunctivae normal.  Cardiovascular:     Rate and Rhythm: Normal rate and regular rhythm.     Pulses: Normal pulses.     Heart sounds: Normal heart sounds.  Pulmonary:     Effort: Pulmonary effort is normal.  Breath sounds: Normal breath sounds and air entry.     Comments: Respirations even and unlabored  Musculoskeletal:        General: Normal range of motion.     Cervical back: Normal range of motion and neck supple.     Right lower leg: No edema.     Left lower leg: No edema.  Skin:    General: Skin is warm and  dry.  Neurological:     General: No focal deficit present.     Mental Status: She is alert and oriented to person, place, and time.  Psychiatric:        Speech: Speech normal.        Behavior: Behavior is cooperative.      UC Treatments / Results  Labs (all labs ordered are listed, but only abnormal results are displayed) Labs Reviewed - No data to display  EKG   Radiology No results found.  Procedures ED EKG  Date/Time: 09/01/2024 2:11 PM  Performed by: Iola Lukes, FNP Authorized by: Iola Lukes, FNP   Interpretation:    Interpretation: normal   Rate:    ECG rate:  90   ECG rate assessment: normal   Rhythm:    Rhythm: sinus rhythm   Ectopy:    Ectopy: none   QRS:    QRS axis:  Normal   QRS intervals:  Normal   QRS conduction: normal   ST segments:    ST segments:  Non-specific T waves:    T waves: non-specific   Q waves:    Abnormal Q-waves: not present   Comments:     Comparison with the prior EKG from 07/12/24 shows consistently normal QT and QTc intervals with no evidence of QT prolongation or shortening on either study. R-wave progression is improved on the current tracing. Mild, diffuse nonspecific T-wave flattening is noted on today's EKG; however, there is no evidence of acute ischemia, infarction, or conduction abnormality. Overall, both EKGs are interpreted as relatively normal.  (including critical care time)  Medications Ordered in UC Medications - No data to display  Initial Impression / Assessment and Plan / UC Course  I have reviewed the triage vital signs and the nursing notes.  Pertinent labs & imaging results that were available during my care of the patient were reviewed by me and considered in my medical decision making (see chart for details).     The patient presents with nocturnal palpitations described as a pounding heartbeat accompanied by a sensation of breathlessness, prompting concern for possible cardiac etiology  in the setting of recent medication adjustments and QT monitoring. She is currently taking an increased dose of Seroquel initiated five weeks ago following cardiology clearance, with prior discontinuation of olmesartan  due to hypotension and reduction of propranolol  dosing. Recent travel with increased sodium intake has been associated with mild, resolving ankle edema and elevated resting heart rates noted at home, though no persistent or worsening cardiopulmonary symptoms are reported. In-clinic EKG comparison with prior tracing from 07/12/24 demonstrates normal QT and QTc intervals without prolongation or shortening, improved R-wave progression, and only mild diffuse nonspecific T-wave flattening, with no evidence of acute ischemia, infarction, or conduction abnormality. Overall findings are reassuring and suggest symptoms are most consistent with palpitations likely related to medication effects, autonomic response, caffeine use, nicotine use, recent dietary changes, or anxiety rather than an acute cardiac process. The patient was advised to continue medications as prescribed by cardiology and psychiatry, proceed with her scheduled nurse follow-up  on Monday for repeat EKG and blood work, and monitor symptoms closely. She was instructed to seek immediate emergency care for persistent or worsening palpitations, new or significant shortness of breath, chest pain, dizziness, syncope, severe headache, or any other concerning or progressive symptoms.  Today's evaluation has revealed no signs of a dangerous process. Discussed diagnosis with patient and/or guardian. Patient and/or guardian aware of their diagnosis, possible red flag symptoms to watch out for and need for close follow up. Patient and/or guardian understands verbal and written discharge instructions. Patient and/or guardian comfortable with plan and disposition.  Patient and/or guardian has a clear mental status at this time, good insight into illness  (after discussion and teaching) and has clear judgment to make decisions regarding their care  Documentation was completed with the aid of voice recognition software. Transcription may contain typographical errors.  Final Clinical Impressions(s) / UC Diagnoses   Final diagnoses:  Palpitations     Discharge Instructions      You were seen today for episodes of heart pounding (palpitations) and a feeling of needing to take deep breaths that occurred overnight. Your EKG today was reassuring and similar to your prior tracing from October, with normal heart rhythm and normal QT intervals, and no signs of a heart attack or dangerous conduction problems. Your symptoms may be related to medication changes, caffeine and nicotine use, recent travel with increased salt intake and fluid shifts, or stress and anxiety rather than an acute heart issue.  Continue taking your medications exactly as prescribed by your cardiologist and psychiatrist, and keep your scheduled nurse visit on Monday for repeat EKG monitoring and blood work. To help manage symptoms, stay well hydrated, limit caffeine and energy drinks as much as possible, continue efforts to reduce smoking, and avoid excessive salt intake to help with swelling. Gentle breathing exercises, resting when palpitations occur, and avoiding stimulant use may help decrease symptom intensity. Elevating your legs in the evening can help reduce ankle swelling.  Seek emergency care right away if you develop ongoing or worsening chest pain, fainting or feeling like you may pass out, persistent or worsening shortness of breath, severe dizziness, new confusion, weakness, vision changes, or palpitations that do not stop or are associated with any concerning symptoms.      ED Prescriptions   None    PDMP not reviewed this encounter.   Iola Lukes, FNP 09/01/24 1440

## 2024-09-01 NOTE — ED Triage Notes (Signed)
 Pt c/o fast heart beat for a few days. Resting heart rate at home has been 80-90.  States her seroquel dose was increased to 300mg  from 200mg  about 4 weeks ago and she is supposed to have follow up Monday to check qt interval and blood work.

## 2024-09-01 NOTE — Discharge Instructions (Addendum)
 You were seen today for episodes of heart pounding (palpitations) and a feeling of needing to take deep breaths that occurred overnight. Your EKG today was reassuring and similar to your prior tracing from October, with normal heart rhythm and normal QT intervals, and no signs of a heart attack or dangerous conduction problems. Your symptoms may be related to medication changes, caffeine and nicotine use, recent travel with increased salt intake and fluid shifts, or stress and anxiety rather than an acute heart issue.  Continue taking your medications exactly as prescribed by your cardiologist and psychiatrist, and keep your scheduled nurse visit on Monday for repeat EKG monitoring and blood work. To help manage symptoms, stay well hydrated, limit caffeine and energy drinks as much as possible, continue efforts to reduce smoking, and avoid excessive salt intake to help with swelling. Gentle breathing exercises, resting when palpitations occur, and avoiding stimulant use may help decrease symptom intensity. Elevating your legs in the evening can help reduce ankle swelling.  Seek emergency care right away if you develop ongoing or worsening chest pain, fainting or feeling like you may pass out, persistent or worsening shortness of breath, severe dizziness, new confusion, weakness, vision changes, or palpitations that do not stop or are associated with any concerning symptoms.

## 2024-09-03 ENCOUNTER — Other Ambulatory Visit: Payer: Self-pay | Admitting: *Deleted

## 2024-09-03 ENCOUNTER — Ambulatory Visit: Admitting: *Deleted

## 2024-09-03 VITALS — BP 118/72 | HR 86 | Ht 63.6 in | Wt 223.4 lb

## 2024-09-03 DIAGNOSIS — R002 Palpitations: Secondary | ICD-10-CM

## 2024-09-03 DIAGNOSIS — I4719 Other supraventricular tachycardia: Secondary | ICD-10-CM | POA: Diagnosis not present

## 2024-09-03 DIAGNOSIS — E782 Mixed hyperlipidemia: Secondary | ICD-10-CM

## 2024-09-03 LAB — LIPID PANEL
Chol/HDL Ratio: 4.7 ratio — ABNORMAL HIGH (ref 0.0–4.4)
Cholesterol, Total: 232 mg/dL — ABNORMAL HIGH (ref 100–199)
HDL: 49 mg/dL (ref >39)
LDL Chol Calc (NIH): 152 mg/dL — ABNORMAL HIGH (ref 0–99)
Triglycerides: 173 mg/dL — ABNORMAL HIGH (ref 0–149)
VLDL Cholesterol Cal: 31 mg/dL (ref 5–40)

## 2024-09-03 NOTE — Patient Instructions (Signed)
   Nurse Visit   Date of Encounter: 09/03/2024 ID: Burnard CHRISTELLA Albino, DOB May 03, 1981, MRN [id number]  PCP:  Almarie Waddell NOVAK, NP   [facility] Providers Cardiologist:  Dub Huntsman, DO      Visit Details   VS:  Ht 5' 3.6 (1.615 m)   Wt 223 lb 6.4 oz (101.3 kg)   BMI 38.83 kg/m  , BMI Body mass index is 38.83 kg/m.  Wt Readings from Last 3 Encounters:  09/03/24 223 lb 6.4 oz (101.3 kg)  07/12/24 218 lb 6.4 oz (99.1 kg)  06/25/24 222 lb (100.7 kg)     Reason for visit: Seroquel XR dose increased  TO 300 mg per PSY and Dr. Huntsman wanted EKG done to monitor Performed today: EKG  Changes (medications, testing, etc.) : Seroquel dose Dose increased 10/17 by psy with cardiology approval. Approval done on 10/16 Length of Visit: 10 minutes    Medications Adjustments/Labs and Tests Ordered: Orders Placed This Encounter  Procedures   EKG 12-Lead   No orders of the defined types were placed in this encounter.    Bonney Trudy Frankey JONELLE, RN  09/03/2024 11:29 AM

## 2024-09-12 ENCOUNTER — Ambulatory Visit: Payer: Self-pay | Admitting: Cardiology

## 2024-09-12 DIAGNOSIS — E782 Mixed hyperlipidemia: Secondary | ICD-10-CM

## 2024-11-20 ENCOUNTER — Ambulatory Visit: Admitting: Pharmacist
